# Patient Record
Sex: Male | Born: 1980 | Race: White | Hispanic: No | Marital: Single | Smoking: Current every day smoker
Health system: Southern US, Community
[De-identification: ages and names within clinical notes are randomized; demographics above are authoritative.]

## PROBLEM LIST (undated history)

## (undated) DIAGNOSIS — K760 Fatty (change of) liver, not elsewhere classified: Secondary | ICD-10-CM

## (undated) DIAGNOSIS — F319 Bipolar disorder, unspecified: Secondary | ICD-10-CM

## (undated) DIAGNOSIS — F32A Depression, unspecified: Secondary | ICD-10-CM

## (undated) DIAGNOSIS — M1990 Unspecified osteoarthritis, unspecified site: Secondary | ICD-10-CM

## (undated) DIAGNOSIS — F1120 Opioid dependence, uncomplicated: Secondary | ICD-10-CM

## (undated) DIAGNOSIS — F1110 Opioid abuse, uncomplicated: Secondary | ICD-10-CM

## (undated) DIAGNOSIS — F4310 Post-traumatic stress disorder, unspecified: Secondary | ICD-10-CM

## (undated) DIAGNOSIS — R569 Unspecified convulsions: Secondary | ICD-10-CM

## (undated) DIAGNOSIS — M25569 Pain in unspecified knee: Secondary | ICD-10-CM

## (undated) DIAGNOSIS — F329 Major depressive disorder, single episode, unspecified: Secondary | ICD-10-CM

## (undated) DIAGNOSIS — G40909 Epilepsy, unspecified, not intractable, without status epilepticus: Secondary | ICD-10-CM

## (undated) HISTORY — PX: BOWEL RESECTION: SHX1257

## (undated) HISTORY — DX: Pain in unspecified knee: M25.569

## (undated) HISTORY — DX: Unspecified osteoarthritis, unspecified site: M19.90

## (undated) HISTORY — DX: Bipolar disorder, unspecified: F31.9

## (undated) HISTORY — PX: KNEE SURGERY: SHX244

## (undated) HISTORY — DX: Fatty (change of) liver, not elsewhere classified: K76.0

## (undated) HISTORY — DX: Opioid dependence, uncomplicated: F11.20

## (undated) HISTORY — DX: Post-traumatic stress disorder, unspecified: F43.10

## (undated) HISTORY — PX: COSMETIC SURGERY: SHX468

## (undated) HISTORY — DX: Epilepsy, unspecified, not intractable, without status epilepticus: G40.909

## (undated) HISTORY — DX: Opioid abuse, uncomplicated: F11.10

---

## 1999-01-15 ENCOUNTER — Emergency Department (HOSPITAL_COMMUNITY): Admission: EM | Admit: 1999-01-15 | Discharge: 1999-01-15 | Payer: Self-pay | Admitting: Emergency Medicine

## 1999-01-15 ENCOUNTER — Encounter: Payer: Self-pay | Admitting: Emergency Medicine

## 2001-03-05 ENCOUNTER — Emergency Department (HOSPITAL_COMMUNITY): Admission: EM | Admit: 2001-03-05 | Discharge: 2001-03-05 | Payer: Self-pay | Admitting: Emergency Medicine

## 2001-03-05 ENCOUNTER — Encounter: Payer: Self-pay | Admitting: Emergency Medicine

## 2001-11-12 ENCOUNTER — Encounter: Payer: Self-pay | Admitting: Emergency Medicine

## 2001-11-12 ENCOUNTER — Emergency Department (HOSPITAL_COMMUNITY): Admission: EM | Admit: 2001-11-12 | Discharge: 2001-11-12 | Payer: Self-pay | Admitting: Emergency Medicine

## 2001-11-17 ENCOUNTER — Emergency Department (HOSPITAL_COMMUNITY): Admission: EM | Admit: 2001-11-17 | Discharge: 2001-11-18 | Payer: Self-pay | Admitting: Internal Medicine

## 2001-11-20 ENCOUNTER — Emergency Department (HOSPITAL_COMMUNITY): Admission: EM | Admit: 2001-11-20 | Discharge: 2001-11-21 | Payer: Self-pay

## 2003-12-11 ENCOUNTER — Emergency Department (HOSPITAL_COMMUNITY): Admission: EM | Admit: 2003-12-11 | Discharge: 2003-12-12 | Payer: Self-pay | Admitting: Emergency Medicine

## 2004-08-01 ENCOUNTER — Emergency Department (HOSPITAL_COMMUNITY): Admission: EM | Admit: 2004-08-01 | Discharge: 2004-08-01 | Payer: Self-pay | Admitting: Emergency Medicine

## 2005-02-12 ENCOUNTER — Emergency Department (HOSPITAL_COMMUNITY): Admission: EM | Admit: 2005-02-12 | Discharge: 2005-02-12 | Payer: Self-pay | Admitting: Emergency Medicine

## 2005-02-18 ENCOUNTER — Emergency Department (HOSPITAL_COMMUNITY): Admission: EM | Admit: 2005-02-18 | Discharge: 2005-02-18 | Payer: Self-pay | Admitting: Emergency Medicine

## 2005-02-22 ENCOUNTER — Emergency Department (HOSPITAL_COMMUNITY): Admission: EM | Admit: 2005-02-22 | Discharge: 2005-02-22 | Payer: Self-pay | Admitting: Emergency Medicine

## 2005-02-23 ENCOUNTER — Encounter (HOSPITAL_COMMUNITY): Admission: RE | Admit: 2005-02-23 | Discharge: 2005-05-24 | Payer: Self-pay | Admitting: Orthopaedic Surgery

## 2005-02-26 ENCOUNTER — Emergency Department (HOSPITAL_COMMUNITY): Admission: EM | Admit: 2005-02-26 | Discharge: 2005-02-26 | Payer: Self-pay | Admitting: Emergency Medicine

## 2005-03-12 ENCOUNTER — Emergency Department (HOSPITAL_COMMUNITY): Admission: EM | Admit: 2005-03-12 | Discharge: 2005-03-12 | Payer: Self-pay | Admitting: *Deleted

## 2005-03-21 ENCOUNTER — Ambulatory Visit: Payer: Self-pay | Admitting: Family Medicine

## 2005-03-22 ENCOUNTER — Emergency Department (HOSPITAL_COMMUNITY): Admission: EM | Admit: 2005-03-22 | Discharge: 2005-03-22 | Payer: Self-pay | Admitting: Emergency Medicine

## 2005-04-26 ENCOUNTER — Ambulatory Visit: Payer: Self-pay | Admitting: Family Medicine

## 2005-04-27 ENCOUNTER — Ambulatory Visit: Payer: Self-pay | Admitting: Family Medicine

## 2005-05-04 ENCOUNTER — Ambulatory Visit: Payer: Self-pay | Admitting: Family Medicine

## 2005-05-06 ENCOUNTER — Ambulatory Visit: Payer: Self-pay | Admitting: *Deleted

## 2005-05-06 ENCOUNTER — Ambulatory Visit: Payer: Self-pay | Admitting: Family Medicine

## 2005-05-09 ENCOUNTER — Ambulatory Visit (HOSPITAL_COMMUNITY): Admission: RE | Admit: 2005-05-09 | Discharge: 2005-05-09 | Payer: Self-pay | Admitting: Family Medicine

## 2005-05-23 ENCOUNTER — Ambulatory Visit: Payer: Self-pay | Admitting: Family Medicine

## 2005-07-15 ENCOUNTER — Ambulatory Visit (HOSPITAL_COMMUNITY): Admission: RE | Admit: 2005-07-15 | Discharge: 2005-07-15 | Payer: Self-pay | Admitting: Family Medicine

## 2005-07-15 ENCOUNTER — Ambulatory Visit: Payer: Self-pay | Admitting: Family Medicine

## 2005-08-01 ENCOUNTER — Ambulatory Visit: Payer: Self-pay | Admitting: Family Medicine

## 2005-08-19 ENCOUNTER — Emergency Department (HOSPITAL_COMMUNITY): Admission: EM | Admit: 2005-08-19 | Discharge: 2005-08-19 | Payer: Self-pay | Admitting: Emergency Medicine

## 2007-07-20 ENCOUNTER — Emergency Department (HOSPITAL_COMMUNITY): Admission: EM | Admit: 2007-07-20 | Discharge: 2007-07-20 | Payer: Self-pay | Admitting: Emergency Medicine

## 2007-08-04 ENCOUNTER — Encounter: Payer: Self-pay | Admitting: Emergency Medicine

## 2007-08-05 ENCOUNTER — Inpatient Hospital Stay (HOSPITAL_COMMUNITY): Admission: EM | Admit: 2007-08-05 | Discharge: 2007-08-07 | Payer: Self-pay | Admitting: Orthopedic Surgery

## 2007-09-18 ENCOUNTER — Encounter (HOSPITAL_COMMUNITY): Admission: RE | Admit: 2007-09-18 | Discharge: 2007-10-08 | Payer: Self-pay | Admitting: Orthopedic Surgery

## 2007-11-18 ENCOUNTER — Emergency Department (HOSPITAL_COMMUNITY): Admission: EM | Admit: 2007-11-18 | Discharge: 2007-11-18 | Payer: Self-pay | Admitting: Emergency Medicine

## 2007-11-18 ENCOUNTER — Inpatient Hospital Stay (HOSPITAL_COMMUNITY): Admission: EM | Admit: 2007-11-18 | Discharge: 2007-11-24 | Payer: Self-pay | Admitting: Psychiatry

## 2007-11-21 ENCOUNTER — Ambulatory Visit: Payer: Self-pay | Admitting: Psychiatry

## 2008-05-11 ENCOUNTER — Emergency Department (HOSPITAL_COMMUNITY): Admission: EM | Admit: 2008-05-11 | Discharge: 2008-05-11 | Payer: Self-pay | Admitting: Emergency Medicine

## 2008-07-03 ENCOUNTER — Emergency Department (HOSPITAL_COMMUNITY): Admission: EM | Admit: 2008-07-03 | Discharge: 2008-07-04 | Payer: Self-pay | Admitting: Emergency Medicine

## 2008-10-21 ENCOUNTER — Emergency Department (HOSPITAL_COMMUNITY): Admission: EM | Admit: 2008-10-21 | Discharge: 2008-10-21 | Payer: Self-pay | Admitting: Emergency Medicine

## 2008-11-04 ENCOUNTER — Emergency Department (HOSPITAL_COMMUNITY): Admission: EM | Admit: 2008-11-04 | Discharge: 2008-11-04 | Payer: Self-pay | Admitting: Emergency Medicine

## 2009-01-21 ENCOUNTER — Emergency Department (HOSPITAL_COMMUNITY): Admission: EM | Admit: 2009-01-21 | Discharge: 2009-01-21 | Payer: Self-pay | Admitting: Emergency Medicine

## 2010-01-31 ENCOUNTER — Encounter: Payer: Self-pay | Admitting: Neurology

## 2010-02-11 ENCOUNTER — Other Ambulatory Visit (HOSPITAL_COMMUNITY): Payer: Self-pay | Admitting: Pulmonary Disease

## 2010-02-11 ENCOUNTER — Ambulatory Visit (HOSPITAL_COMMUNITY)
Admission: RE | Admit: 2010-02-11 | Discharge: 2010-02-11 | Disposition: A | Discharge status: Home / Self Care | Payer: Medicaid Other | Source: Ambulatory Visit | Attending: Pulmonary Disease | Admitting: Pulmonary Disease

## 2010-02-11 DIAGNOSIS — M25562 Pain in left knee: Secondary | ICD-10-CM

## 2010-02-11 DIAGNOSIS — IMO0002 Reserved for concepts with insufficient information to code with codable children: Secondary | ICD-10-CM | POA: Insufficient documentation

## 2010-02-11 DIAGNOSIS — M1710 Unilateral primary osteoarthritis, unspecified knee: Secondary | ICD-10-CM | POA: Insufficient documentation

## 2010-02-11 DIAGNOSIS — M25569 Pain in unspecified knee: Secondary | ICD-10-CM | POA: Insufficient documentation

## 2010-03-08 ENCOUNTER — Other Ambulatory Visit (HOSPITAL_COMMUNITY): Payer: Self-pay | Admitting: Pulmonary Disease

## 2010-03-08 DIAGNOSIS — R945 Abnormal results of liver function studies: Secondary | ICD-10-CM

## 2010-03-11 ENCOUNTER — Ambulatory Visit (HOSPITAL_COMMUNITY): Payer: Medicaid Other

## 2010-03-13 ENCOUNTER — Emergency Department (HOSPITAL_COMMUNITY): Payer: Medicaid Other

## 2010-03-13 ENCOUNTER — Emergency Department (HOSPITAL_COMMUNITY)
Admission: EM | Admit: 2010-03-13 | Discharge: 2010-03-13 | Disposition: A | Discharge status: Home / Self Care | Payer: Medicaid Other | Attending: Emergency Medicine | Admitting: Emergency Medicine

## 2010-03-13 DIAGNOSIS — R059 Cough, unspecified: Secondary | ICD-10-CM | POA: Insufficient documentation

## 2010-03-13 DIAGNOSIS — R05 Cough: Secondary | ICD-10-CM | POA: Insufficient documentation

## 2010-03-13 DIAGNOSIS — J3489 Other specified disorders of nose and nasal sinuses: Secondary | ICD-10-CM | POA: Insufficient documentation

## 2010-03-13 DIAGNOSIS — R0609 Other forms of dyspnea: Secondary | ICD-10-CM | POA: Insufficient documentation

## 2010-03-13 DIAGNOSIS — R0989 Other specified symptoms and signs involving the circulatory and respiratory systems: Secondary | ICD-10-CM | POA: Insufficient documentation

## 2010-03-13 DIAGNOSIS — R071 Chest pain on breathing: Secondary | ICD-10-CM | POA: Insufficient documentation

## 2010-03-18 ENCOUNTER — Ambulatory Visit (HOSPITAL_COMMUNITY)
Admission: RE | Admit: 2010-03-18 | Discharge: 2010-03-18 | Disposition: A | Discharge status: Home / Self Care | Payer: Medicaid Other | Source: Ambulatory Visit | Attending: Pulmonary Disease | Admitting: Pulmonary Disease

## 2010-03-18 DIAGNOSIS — R748 Abnormal levels of other serum enzymes: Secondary | ICD-10-CM | POA: Insufficient documentation

## 2010-03-18 DIAGNOSIS — K769 Liver disease, unspecified: Secondary | ICD-10-CM | POA: Insufficient documentation

## 2010-03-18 DIAGNOSIS — R945 Abnormal results of liver function studies: Secondary | ICD-10-CM

## 2010-04-15 LAB — PHENYTOIN LEVEL, TOTAL
Phenytoin Lvl: 14.6 ug/mL (ref 10.0–20.0)
Phenytoin Lvl: <2.5 ug/mL — ABNORMAL LOW (ref 10.0–20.0)
Phenytoin Lvl: <2.5 ug/mL — ABNORMAL LOW (ref 10.0–20.0)

## 2010-04-15 LAB — COMPREHENSIVE METABOLIC PANEL
ALT: 15 U/L (ref 0–53)
AST: 23 U/L (ref 0–37)
Albumin: 3.6 g/dL (ref 3.5–5.2)
Alkaline Phosphatase: 71 U/L (ref 39–117)
BUN: 9 mg/dL (ref 6–23)
CO2: 26 mEq/L (ref 19–32)
Calcium: 9 mg/dL (ref 8.4–10.5)
Chloride: 104 mEq/L (ref 96–112)
Creatinine, Ser: 0.87 mg/dL (ref 0.4–1.5)
GFR calc Af Amer: >60 mL/min (ref >60)
GFR calc non Af Amer: >60 mL/min (ref >60)
Glucose, Bld: 100 mg/dL — ABNORMAL HIGH (ref 70–99)
Potassium: 3.7 mEq/L (ref 3.5–5.1)
Sodium: 135 mEq/L (ref 135–145)
Total Bilirubin: 0.4 mg/dL (ref 0.3–1.2)
Total Protein: 6.3 g/dL (ref 6.0–8.3)

## 2010-04-15 LAB — HEPATIC FUNCTION PANEL
ALT: 21 U/L (ref 0–53)
AST: 26 U/L (ref 0–37)
Albumin: 3.6 g/dL (ref 3.5–5.2)
Alkaline Phosphatase: 61 U/L (ref 39–117)
Bilirubin, Direct: 0.1 mg/dL (ref 0.0–0.3)
Indirect Bilirubin: 0.5 mg/dL (ref 0.3–0.9)
Total Bilirubin: 0.6 mg/dL (ref 0.3–1.2)
Total Protein: 6.5 g/dL (ref 6.0–8.3)

## 2010-04-15 LAB — CK: Total CK: 158 U/L (ref 7–232)

## 2010-04-19 ENCOUNTER — Encounter: Payer: Self-pay | Admitting: Urgent Care

## 2010-04-19 ENCOUNTER — Ambulatory Visit (INDEPENDENT_AMBULATORY_CARE_PROVIDER_SITE_OTHER): Payer: Medicaid Other | Admitting: Urgent Care

## 2010-04-19 VITALS — BP 125/74 | HR 94 | Temp 98.1°F | Ht 65.0 in | Wt 195.0 lb

## 2010-04-19 DIAGNOSIS — K7689 Other specified diseases of liver: Secondary | ICD-10-CM

## 2010-04-19 DIAGNOSIS — K760 Fatty (change of) liver, not elsewhere classified: Secondary | ICD-10-CM

## 2010-04-19 DIAGNOSIS — R748 Abnormal levels of other serum enzymes: Secondary | ICD-10-CM | POA: Insufficient documentation

## 2010-04-19 LAB — DIFFERENTIAL
Basophils Absolute: 0 10*3/uL (ref 0.0–0.1)
Basophils Relative: 1 % (ref 0–1)
Eosinophils Absolute: 0 10*3/uL (ref 0.0–0.7)
Eosinophils Relative: 1 % (ref 0–5)
Lymphocytes Relative: 41 % (ref 12–46)
Lymphs Abs: 2.5 10*3/uL (ref 0.7–4.0)
Monocytes Absolute: 0.6 10*3/uL (ref 0.1–1.0)
Monocytes Relative: 10 % (ref 3–12)
Neutro Abs: 2.9 10*3/uL (ref 1.7–7.7)
Neutrophils Relative %: 48 % (ref 43–77)

## 2010-04-19 LAB — BASIC METABOLIC PANEL
BUN: 12 mg/dL (ref 6–23)
CO2: 27 mEq/L (ref 19–32)
Calcium: 9.1 mg/dL (ref 8.4–10.5)
Chloride: 104 mEq/L (ref 96–112)
Creatinine, Ser: 1.04 mg/dL (ref 0.4–1.5)
GFR calc Af Amer: >60 mL/min (ref >60)
GFR calc non Af Amer: >60 mL/min (ref >60)
Glucose, Bld: 103 mg/dL — ABNORMAL HIGH (ref 70–99)
Potassium: 3.9 mEq/L (ref 3.5–5.1)
Sodium: 140 mEq/L (ref 135–145)

## 2010-04-19 LAB — URINALYSIS, ROUTINE W REFLEX MICROSCOPIC
Bilirubin Urine: NEGATIVE
Glucose, UA: NEGATIVE mg/dL
Hgb urine dipstick: NEGATIVE
Nitrite: NEGATIVE
Protein, ur: NEGATIVE mg/dL
Specific Gravity, Urine: 1.01 (ref 1.005–1.030)
Urobilinogen, UA: 0.2 mg/dL (ref 0.0–1.0)
pH: 7.5 (ref 5.0–8.0)

## 2010-04-19 LAB — RAPID URINE DRUG SCREEN, HOSP PERFORMED
Amphetamines: NOT DETECTED
Barbiturates: NOT DETECTED
Benzodiazepines: POSITIVE — AB
Cocaine: NOT DETECTED
Opiates: POSITIVE — AB
Tetrahydrocannabinol: NOT DETECTED

## 2010-04-19 LAB — HEPATIC FUNCTION PANEL
ALT: 24 U/L (ref 0–53)
AST: 41 U/L — ABNORMAL HIGH (ref 0–37)
Albumin: 3.3 g/dL — ABNORMAL LOW (ref 3.5–5.2)
Alkaline Phosphatase: 66 U/L (ref 39–117)
Bilirubin, Direct: 0.1 mg/dL (ref 0.0–0.3)
Indirect Bilirubin: 0.3 mg/dL (ref 0.3–0.9)
Total Bilirubin: 0.4 mg/dL (ref 0.3–1.2)
Total Protein: 5.8 g/dL — ABNORMAL LOW (ref 6.0–8.3)

## 2010-04-19 LAB — CBC
HCT: 39.9 % (ref 39.0–52.0)
Hemoglobin: 14.1 g/dL (ref 13.0–17.0)
MCHC: 35.3 g/dL (ref 30.0–36.0)
MCV: 93.8 fL (ref 78.0–100.0)
Platelets: 286 10*3/uL (ref 150–400)
RBC: 4.25 MIL/uL (ref 4.22–5.81)
RDW: 14 % (ref 11.5–15.5)
WBC: 6.1 10*3/uL (ref 4.0–10.5)

## 2010-04-19 LAB — LIPASE, BLOOD: Lipase: 16 U/L (ref 11–59)
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
## 2010-04-19 NOTE — Patient Instructions (Signed)
We will call with test results Fatty Liver Hepatosteatosis, Steatohepatitis Fatty liver is the accumulation of fat in liver cells. It is also called hepatosteatosis or steatohepatitis. It is normal for your liver to contain some fat. If fat is more than 5-10% of your liver's weight, you have fatty liver.  There are often no symptoms (problems) for years while damage is still occurring. People often learn about their fatty liver when they have medical tests for other reasons. Fat can damage your liver for years or even decades without causing problems. When it becomes severe, it can cause fatigue, weight loss, weakness, and confusion. This makes you more likely to develop more serious liver problems. The liver is the largest organ in the body. It does a lot of work and often gives no warning signs when it is sick until late in a disease. The liver has many important jobs including:  Breaking down foods.   Storing vitamins, iron, and other minerals.   Making proteins.   Making bile for food digestion.   Breaking down many products including medications, alcohol and some poisons.  CAUSES There are a number of different conditions, medications, and poisons that can cause a fatty liver. Eating too many calories causes fat to build up in the liver. Not processing and breaking fats down normally may also cause this. Certain conditions, such as obesity, diabetes, and high triglycerides also cause this. Most fatty liver patients tend to be middle-aged and over weight.  Some causes of fatty liver are:  Alcohol over consumption.  Malnutrition.   Steroid use.   Valproic acid toxicity.   Obesity.  Cushing's syndrome.   Poisons.   Tetracycline in high dosages.   Pregnancy.  Diabetes.   Hyperlipidemia.   Rapid weight loss.   Some people develop fatty liver even having none of these conditions. SYMPTOMS Fatty liver most often causes no problems. This is called asymptomatic.  It can be  diagnosed with blood tests and also by a liver biopsy.   It is one of the most common causes of minor elevations of liver enzymes on routine blood tests.   Specialized Imaging of the liver using ultrasound, CT (computed tomography) scan, or MRI (magnetic resonance imaging) can suggest a fatty liver but a biopsy is needed to confirm it.   A biopsy involves taking a small sample of liver tissue. This is done by using a needle. It is then looked at under a microscope by a specialist.  TREATMENT  It is important to treat the cause. Simple fatty liver without a medical reason may not need treatment.  Weight loss, fat restriction, and exercise in overweight patients produces inconsistent results but is worth trying.   Fatty liver due to alcohol toxicity may not improve even with stopping drinking.   Good control of diabetes may reduce fatty liver.   Lower your triglycerides through diet, medication or both.   Eat a balanced, healthy diet.   Increase your physical activity.   Get regular checkups from a liver specialist.   There are no medical or surgical treatments for a fatty liver or NASH, but improving your diet and increasing your exercise may help prevent or reverse some of the damage.  PROGNOSIS Fatty liver may cause no damage or it can lead to an inflammation of the liver. This is, called steatohepatitis. When it is linked to alcohol abuse, it is called alcoholic steatohepatitis. It often is not linked to alcohol. It is then called nonalcoholic steatohepatitis, or NASH.  Over time the liver may become scarred and hardened. This condition is called cirrhosis. Cirrhosis is serious and may lead to liver failure or cancer. NASH is one of the leading causes of cirrhosis. About 10-20% of Americans have fatty liver and a smaller 2-5% has NASH. Much of this information is from the  [employer]. Last reviewed by [facility] 02-10-05 Document Released: 02/11/2005 Document Re-Released:  03/25/2008 [facility] Patient Information 2011 [place], [place].

## 2010-04-19 NOTE — Assessment & Plan Note (Signed)
See elevated liver enzymes

## 2010-04-19 NOTE — Assessment & Plan Note (Signed)
30 year old Caucasian male with fatty liver on ultrasound and mildly elevated LFTs that are between 2 and 3 times normal. Alkaline phosphatase is slightly abnormal. He has history of polysubstance abuse. He denies any current alcohol use, although he does have sweet acetone breath today. He is on Dilantin. He does have risk factors for viral hepatitis including tattoos, multiple sexual partners, and history of polysubstance abuse.  He tells me he has had negative viral markers within the past 6 months through Dr. Juanetta Gosling office. I suspect he may be continuing to consume alcohol and in denial leading to elevated liver enzymes. Other differentials include non-alcoholic fatty liver disease, or drug effect. Less likely would be Wilson's disease, iron overload, or autoimmune and given his mild elevations.

## 2010-04-20 ENCOUNTER — Other Ambulatory Visit: Payer: Self-pay | Admitting: Urgent Care

## 2010-04-20 ENCOUNTER — Telehealth: Payer: Self-pay | Admitting: Urgent Care

## 2010-04-20 DIAGNOSIS — R7989 Other specified abnormal findings of blood chemistry: Secondary | ICD-10-CM

## 2010-04-20 LAB — HEPATIC FUNCTION PANEL
ALT: 104 U/L — ABNORMAL HIGH (ref 0–53)
AST: 120 U/L — ABNORMAL HIGH (ref 0–37)
Albumin: 4.3 g/dL (ref 3.5–5.2)
Alkaline Phosphatase: 96 U/L (ref 39–117)
Bilirubin, Direct: 0.1 mg/dL (ref 0.0–0.3)
Indirect Bilirubin: 0.2 mg/dL (ref 0.0–0.9)
Total Bilirubin: 0.3 mg/dL (ref 0.3–1.2)
Total Protein: 7.1 g/dL (ref 6.0–8.3)

## 2010-04-20 LAB — DRUG SCREEN, URINE
Amphetamine Screen, Ur: NEGATIVE
Barbiturate Quant, Ur: NEGATIVE
Benzodiazepines.: POSITIVE — AB
Cocaine Metabolites: NEGATIVE
Creatinine,U: 159.9 mg/dL
Marijuana Metabolite: NEGATIVE
Methadone: NEGATIVE
Opiates: NEGATIVE
Phencyclidine (PCP): NEGATIVE
Propoxyphene: NEGATIVE

## 2010-04-20 LAB — CBC WITH DIFFERENTIAL/PLATELET
Basophils Absolute: 0.1 10*3/uL (ref 0.0–0.1)
Basophils Relative: 2 % — ABNORMAL HIGH (ref 0–1)
Eosinophils Absolute: 0.2 10*3/uL (ref 0.0–0.7)
Eosinophils Relative: 5 % (ref 0–5)
HCT: 40.6 % (ref 39.0–52.0)
Hemoglobin: 13.5 g/dL (ref 13.0–17.0)
Lymphocytes Relative: 38 % (ref 12–46)
Lymphs Abs: 1.9 10*3/uL (ref 0.7–4.0)
MCH: 30.5 pg (ref 26.0–34.0)
MCHC: 33.3 g/dL (ref 30.0–36.0)
MCV: 91.9 fL (ref 78.0–100.0)
Monocytes Absolute: 0.5 10*3/uL (ref 0.1–1.0)
Monocytes Relative: 11 % (ref 3–12)
Neutro Abs: 2.2 10*3/uL (ref 1.7–7.7)
Neutrophils Relative %: 45 % (ref 43–77)
Platelets: 193 10*3/uL (ref 150–400)
RBC: 4.42 MIL/uL (ref 4.22–5.81)
RDW: 14.3 % (ref 11.5–15.5)
WBC: 4.9 10*3/uL (ref 4.0–10.5)

## 2010-04-20 LAB — ETHANOL: Alcohol, Ethyl (B): 161 mg/dL — ABNORMAL HIGH (ref 0–10)

## 2010-04-20 LAB — PHENYTOIN LEVEL, TOTAL: Phenytoin Lvl: 3.4 ug/mL — ABNORMAL LOW (ref 10.0–20.0)

## 2010-04-20 LAB — PROTIME-INR
INR: 0.87 (ref <1.50)
Prothrombin Time: 12 seconds (ref 11.6–15.2)

## 2010-04-20 NOTE — Telephone Encounter (Signed)
I will speak w/ pt & give him test results if he returns call Thanks

## 2010-04-20 NOTE — Telephone Encounter (Signed)
Discussed results w/ pt. He does want to quit drinking etoh. Admits to 7 beers/day here & there Does not want to go to AA Will speak w/ his counselor in [place] Will FU w/ Dr Juanetta Gosling ZO:XWRU cessation **Does not want medical info released to mother--Please place HIPPA flag in chart Needs OV in 1 month w/ me/RMR w/ recheck LFTS prior to OV Thanks

## 2010-04-20 NOTE — Telephone Encounter (Signed)
LMOM for pt to call me back to set up 1 months OV

## 2010-04-20 NOTE — Progress Notes (Signed)
Faxed labs to PCP

## 2010-04-20 NOTE — Telephone Encounter (Signed)
Pt called- said he can be reached at [phone]

## 2010-04-20 NOTE — Telephone Encounter (Signed)
Lab order on file. 

## 2010-04-26 NOTE — Telephone Encounter (Signed)
Letter mailed to patient.

## 2010-04-28 ENCOUNTER — Encounter: Payer: Self-pay | Admitting: Internal Medicine

## 2010-04-28 NOTE — Telephone Encounter (Signed)
Mailed appt card to pt. OV is 06/09/10 @ 330 pm with LSL
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
## 2010-05-28 NOTE — Consult Note (Signed)
NAMEDENNISE, BAMBER              ACCOUNT NO.:  [id number]   MEDICAL RECORD NO.:  [id number]          PATIENT TYPE:  EMS   LOCATION:  ED                            FACILITY:  [facility]   PHYSICIAN:  J. Darreld Mclean, M.D. DATE OF BIRTH:  1980-06-07   DATE OF CONSULTATION:  02/22/2005  DATE OF DISCHARGE:                                   CONSULTATION   REASON FOR CONSULTATION:  This 30 year old male states that approximately 10  days ago he got bit by an insect on his left dorsal wrist.  He was seen in  the ER and given medication.  He was seen about 5 days ago.  He has been on  Keflex.  He has some redness, some streaks and pain.  He has some purulent  material on the wrist.  He can move his fingers.  There is no tendon  synovitis present.  They aspirated some purulent material and sent for  culture and sensitivity.  It is tender.  He is afebrile.  His white count is  11,000 with a slightly increased sedimentation rate.   IMPRESSION:  Infection, left dorsal hand.   RECOMMENDATIONS:  Recommend changing him to vancomycin on IV tonight and  will start him on it x10 days.  I will see him in the office in the morning.  I explained to him if it gets worse, to come back.  Prescription for Tylox  given for pain.           ______________________________  J. Darreld Mclean, M.D.     JWK/MEDQ  D:  02/22/2005  T:  02/22/2005  Job:  [id number]

## 2010-05-28 NOTE — Discharge Summary (Signed)
NAMECHIBUEZE, BEASLEY              ACCOUNT NO.:  [id number]   MEDICAL RECORD NO.:  [id number]          PATIENT TYPE:  INP   LOCATION:  5509                         FACILITY:  [facility]   PHYSICIAN:  Madelynn Done, MD  DATE OF BIRTH:  1980-11-04   DATE OF ADMISSION:  08/05/2007  DATE OF DISCHARGE:  08/07/2007                               DISCHARGE SUMMARY   REASON FOR ADMISSION:  Laceration, right forearm with tendon and artery  involvement.   DISCHARGE MEDICATIONS:  1. Oxycodone 10 mg p.o. q.4-6 hours as needed for pain.  2. Enteric-coated aspirin 325 mg p.o. b.i.d.  3. Colace 100 mg p.o. b.i.d.   PROCEDURE IN DETAIL:  Right forearm wound exploration and nerve tendon  and artery repair on August 05, 2007.   REASON FOR ADMISSION:  Mr. Mckamie is a 30 year old gentleman who  sustained an injury to his right forearm after he falling through a  glass window.  The patient was then transferred to [facility]  and seen and evaluated and taken to the OR on the day of admission.   HOSPITAL COURSE:  The patient tolerated the above procedure.  Throughout  his hospital course, he was afebrile.  Vital signs were stable and  normal.  He is tolerating a regular diet.  He was felt ready for  discharge to home on postoperative day #2.   DISPOSITION:  The patient is to follow up in the office in approximately  10-14 days. He is going to continue the above medications.  He is going  to come back to clinic if he has any wrist pain or problems with the  splint.  Prior to his discharge, pt voiced understanding of the plan.      Madelynn Done, MD  Electronically Signed     FWO/MEDQ  D:  08/09/2007  T:  08/09/2007  Job:  [id number]
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
## 2010-06-09 ENCOUNTER — Ambulatory Visit (INDEPENDENT_AMBULATORY_CARE_PROVIDER_SITE_OTHER): Payer: Medicaid Other | Admitting: Gastroenterology

## 2010-06-09 ENCOUNTER — Encounter: Payer: Self-pay | Admitting: Gastroenterology

## 2010-06-09 VITALS — BP 139/93 | HR 95 | Temp 97.7°F | Ht 65.0 in | Wt 193.8 lb

## 2010-06-09 DIAGNOSIS — F1011 Alcohol abuse, in remission: Secondary | ICD-10-CM | POA: Insufficient documentation

## 2010-06-09 DIAGNOSIS — K760 Fatty (change of) liver, not elsewhere classified: Secondary | ICD-10-CM

## 2010-06-09 DIAGNOSIS — R748 Abnormal levels of other serum enzymes: Secondary | ICD-10-CM

## 2010-06-09 DIAGNOSIS — K7689 Other specified diseases of liver: Secondary | ICD-10-CM

## 2010-06-09 NOTE — Progress Notes (Signed)
Primary Care Physician: Fredirick Maudlin, MD  Primary Gastroenterologist:  Roetta Sessions, MD  Chief Complaint  Patient presents with  . Follow-up    abnormal liver function test    HPI: Dean [name] is a 30 y.o. male here for f/u. History of elevated LFTs. Fatty liver. Etoh abuse. Reported negative HCV in past. Patient had Left knee arthroscopic surgery last week. States he had recurrence of Bell's palsy after surgery. Denies any ongoing alcohol use since his last office visit here. Denies abdominal pain. No heartburn on Nexium. Sleep sitting up due to sleep apnea. States he has not had a sleep study. Takes a stool softener day. Problem is regular. No blood in the stool or melena.   Current Outpatient Prescriptions  Medication Sig Dispense Refill  . buprenorphine-naloxone (SUBOXONE) 2-0.5 MG SUBL Place 8 tablets under the tongue 3 (three) times daily.        . cetirizine (ZYRTEC) 10 MG chewable tablet Chew 10 mg by mouth daily.        . clonazePAM (KLONOPIN) 2 MG tablet Take 2 mg by mouth 2 (two) times daily as needed.        . DULoxetine (CYMBALTA) 60 MG capsule Take 60 mg by mouth daily.        . fluticasone (FLONASE) 50 MCG/ACT nasal spray 2 sprays by Nasal route daily.        Marland Kitchen gabapentin (NEURONTIN) 300 MG capsule Take 600 mg by mouth 3 (three) times daily.        Marland Kitchen ibuprofen (ADVIL,MOTRIN) 800 MG tablet Take 800 mg by mouth every 8 (eight) hours as needed.        . phenytoin (DILANTIN) 300 MG ER capsule Take 300 mg by mouth daily.        . traMADol (ULTRAM) 50 MG tablet Take 50 mg by mouth 2 (two) times daily.          Allergies as of 06/09/2010  . (No Known Allergies)    ROS:  General: Negative for anorexia, weight loss, fever, chills, fatigue, weakness. ENT: Negative for hoarseness, difficulty swallowing , nasal congestion. CV: Negative for chest pain, angina, palpitations, dyspnea on exertion, peripheral edema.  Respiratory: Negative for dyspnea at rest, dyspnea on  exertion, cough, sputum, wheezing.  GI: See history of present illness. GU:  Negative for dysuria, hematuria, urinary incontinence, urinary frequency, nocturnal urination.  Endo: Negative for unusual weight change.    Physical Examination:   BP 139/93  Pulse 95  Temp(Src) 97.7 F (36.5 C) (Temporal)  Ht 5\' 5"  (1.651 m)  Wt 193 lb 12.8 oz (87.907 kg)  BMI 32.25 kg/m2  General: Well-nourished, well-developed in no acute distress.  Eyes: No icterus. Mouth: Oropharyngeal mucosa moist and pink , no lesions erythema or exudate. Lungs: Clear to auscultation bilaterally.  Heart: Regular rate and rhythm, no murmurs rubs or gallops.  Abdomen: Bowel sounds are normal, nontender, nondistended, no hepatosplenomegaly or masses, no abdominal bruits or hernia , no rebound or guarding.   Extremities: No lower extremity edema.  Neuro: Alert and oriented x 4   Skin: Warm and dry, no jaundice.   Psych: Alert and cooperative, normal mood and affect.

## 2010-06-10 ENCOUNTER — Encounter: Payer: Self-pay | Admitting: Gastroenterology

## 2010-06-11 NOTE — Progress Notes (Signed)
Cc to PCP 

## 2010-06-11 NOTE — Assessment & Plan Note (Addendum)
History of abnormal LFTs. Ultrasound shows fatty liver. History of alcohol abuse. He had ethanol level above 140 a day he was seen in our office on 04/19/2010. He denies any ongoing alcohol use however suspect that he been drinking prior to today's visit. He agreed to have blood work done right after the office visit however on his way out of the office he told staff that he would go on Friday. Had a long discussion with him regarding fatty liver. Recommend 1-2# weight loss per week until ideal body weight through exercise & diet. Low fat/cholesterol diet. Gradually increase exercise from 15 min daily up to 1 hr per day 5 days/week. No etoh use.  Will check labs to r/o hemochromatosis as well.

## 2010-06-24 NOTE — Progress Notes (Signed)
FATTY LIVER DISEASE 2o to ETOH/OBESITY. AGREE WITH EVAL FOR HEMOCHROMATOSIS. FERRITIN MAY BE ELEVATED DUE TO IMPAIRED FATTY OXIDATION.

## 2010-06-28 NOTE — Progress Notes (Signed)
Letter mailed to pt.  

## 2010-07-15 ENCOUNTER — Telehealth: Payer: Self-pay | Admitting: Gastroenterology

## 2010-07-15 NOTE — Telephone Encounter (Signed)
Please find out if patient had labs done. If not, please request he does.

## 2010-07-16 NOTE — Telephone Encounter (Signed)
Mailed reminder letter to pt. 

## 2010-08-05 ENCOUNTER — Ambulatory Visit (HOSPITAL_COMMUNITY)
Admission: RE | Admit: 2010-08-05 | Discharge: 2010-08-05 | Disposition: A | Discharge status: Home / Self Care | Payer: Medicare Other | Attending: Psychiatry | Admitting: Psychiatry

## 2010-08-05 ENCOUNTER — Emergency Department (HOSPITAL_COMMUNITY)
Admission: EM | Admit: 2010-08-05 | Discharge: 2010-08-06 | Disposition: A | Discharge status: Other Acute Inpatient Hospital | Payer: Medicare Other | Source: Home / Self Care | Attending: Emergency Medicine | Admitting: Emergency Medicine

## 2010-08-05 DIAGNOSIS — F1010 Alcohol abuse, uncomplicated: Secondary | ICD-10-CM | POA: Insufficient documentation

## 2010-08-05 DIAGNOSIS — Z79899 Other long term (current) drug therapy: Secondary | ICD-10-CM | POA: Insufficient documentation

## 2010-08-05 DIAGNOSIS — F329 Major depressive disorder, single episode, unspecified: Secondary | ICD-10-CM | POA: Insufficient documentation

## 2010-08-05 DIAGNOSIS — F3289 Other specified depressive episodes: Secondary | ICD-10-CM | POA: Insufficient documentation

## 2010-08-05 LAB — COMPREHENSIVE METABOLIC PANEL
ALT: 122 U/L — ABNORMAL HIGH (ref 0–53)
AST: 148 U/L — ABNORMAL HIGH (ref 0–37)
Albumin: 4.2 g/dL (ref 3.5–5.2)
Alkaline Phosphatase: 96 U/L (ref 39–117)
BUN: 6 mg/dL (ref 6–23)
CO2: 26 mEq/L (ref 19–32)
Calcium: 9.9 mg/dL (ref 8.4–10.5)
Chloride: 95 mEq/L — ABNORMAL LOW (ref 96–112)
Creatinine, Ser: 0.7 mg/dL (ref 0.50–1.35)
GFR calc Af Amer: >60 mL/min (ref >60)
GFR calc non Af Amer: >60 mL/min (ref >60)
Glucose, Bld: 114 mg/dL — ABNORMAL HIGH (ref 70–99)
Potassium: 3.7 mEq/L (ref 3.5–5.1)
Sodium: 133 mEq/L — ABNORMAL LOW (ref 135–145)
Total Bilirubin: 0.3 mg/dL (ref 0.3–1.2)
Total Protein: 8.5 g/dL — ABNORMAL HIGH (ref 6.0–8.3)

## 2010-08-05 LAB — DIFFERENTIAL
Basophils Absolute: 0.1 10*3/uL (ref 0.0–0.1)
Basophils Relative: 2 % — ABNORMAL HIGH (ref 0–1)
Eosinophils Absolute: 0.1 10*3/uL (ref 0.0–0.7)
Eosinophils Relative: 2 % (ref 0–5)
Lymphocytes Relative: 29 % (ref 12–46)
Lymphs Abs: 1.8 10*3/uL (ref 0.7–4.0)
Monocytes Absolute: 0.9 10*3/uL (ref 0.1–1.0)
Monocytes Relative: 15 % — ABNORMAL HIGH (ref 3–12)
Neutro Abs: 3.2 10*3/uL (ref 1.7–7.7)
Neutrophils Relative %: 52 % (ref 43–77)

## 2010-08-05 LAB — LITHIUM LEVEL: Lithium Lvl: 0.26 mEq/L — ABNORMAL LOW (ref 0.80–1.40)

## 2010-08-05 LAB — ETHANOL: Alcohol, Ethyl (B): 52 mg/dL — ABNORMAL HIGH (ref 0–11)

## 2010-08-05 LAB — CBC
HCT: 42.6 % (ref 39.0–52.0)
Hemoglobin: 15.1 g/dL (ref 13.0–17.0)
MCH: 31.5 pg (ref 26.0–34.0)
MCHC: 35.4 g/dL (ref 30.0–36.0)
MCV: 88.9 fL (ref 78.0–100.0)
Platelets: 363 10*3/uL (ref 150–400)
RBC: 4.79 MIL/uL (ref 4.22–5.81)
RDW: 12.8 % (ref 11.5–15.5)
WBC: 6.1 10*3/uL (ref 4.0–10.5)

## 2010-08-05 LAB — RAPID URINE DRUG SCREEN, HOSP PERFORMED
Amphetamines: NOT DETECTED
Barbiturates: NOT DETECTED
Benzodiazepines: POSITIVE — AB
Cocaine: NOT DETECTED
Opiates: NOT DETECTED
Tetrahydrocannabinol: NOT DETECTED

## 2010-08-06 ENCOUNTER — Inpatient Hospital Stay (HOSPITAL_COMMUNITY)
Admission: RE | Admit: 2010-08-06 | Discharge: 2010-08-09 | DRG: 897 | Disposition: A | Discharge status: Home / Self Care | Payer: Medicare Other | Source: Ambulatory Visit | Attending: Psychiatry | Admitting: Psychiatry

## 2010-08-06 DIAGNOSIS — F1020 Alcohol dependence, uncomplicated: Secondary | ICD-10-CM

## 2010-08-06 DIAGNOSIS — G40909 Epilepsy, unspecified, not intractable, without status epilepticus: Secondary | ICD-10-CM

## 2010-08-06 DIAGNOSIS — F1910 Other psychoactive substance abuse, uncomplicated: Secondary | ICD-10-CM

## 2010-08-06 DIAGNOSIS — R748 Abnormal levels of other serum enzymes: Secondary | ICD-10-CM

## 2010-08-06 DIAGNOSIS — F4310 Post-traumatic stress disorder, unspecified: Secondary | ICD-10-CM

## 2010-08-06 DIAGNOSIS — F1120 Opioid dependence, uncomplicated: Secondary | ICD-10-CM

## 2010-08-06 DIAGNOSIS — Z560 Unemployment, unspecified: Secondary | ICD-10-CM

## 2010-08-06 LAB — PHENYTOIN LEVEL, TOTAL: Phenytoin Lvl: 4.5 ug/mL — ABNORMAL LOW (ref 10.0–20.0)

## 2010-08-06 LAB — HEPATIC FUNCTION PANEL
ALT: 101 U/L — ABNORMAL HIGH (ref 0–53)
AST: 89 U/L — ABNORMAL HIGH (ref 0–37)
Albumin: 4.1 g/dL (ref 3.5–5.2)
Alkaline Phosphatase: 98 U/L (ref 39–117)
Bilirubin, Direct: 0.1 mg/dL (ref 0.0–0.3)
Indirect Bilirubin: 0.2 mg/dL — ABNORMAL LOW (ref 0.3–0.9)
Total Bilirubin: 0.3 mg/dL (ref 0.3–1.2)
Total Protein: 7.8 g/dL (ref 6.0–8.3)

## 2010-08-09 LAB — HEPATIC FUNCTION PANEL
ALT: 63 U/L — ABNORMAL HIGH (ref 0–53)
AST: 42 U/L — ABNORMAL HIGH (ref 0–37)
Albumin: 3.9 g/dL (ref 3.5–5.2)
Alkaline Phosphatase: 91 U/L (ref 39–117)
Bilirubin, Direct: 0.1 mg/dL (ref 0.0–0.3)
Indirect Bilirubin: 0.1 mg/dL — ABNORMAL LOW (ref 0.3–0.9)
Total Bilirubin: 0.2 mg/dL — ABNORMAL LOW (ref 0.3–1.2)
Total Protein: 7.4 g/dL (ref 6.0–8.3)

## 2010-08-13 NOTE — Discharge Summary (Signed)
  NAMESMILEY, Dean [name]              ACCOUNT NO.:  [id number]  MEDICAL RECORD NO.:  [id number]  LOCATION:  7829                          FACILITY:  [facility]  PHYSICIAN:  Orson Aloe, MD       DATE OF BIRTH:  October 25, 1980  DATE OF ADMISSION:  08/06/2010 DATE OF DISCHARGE:  08/09/2010                              DISCHARGE SUMMARY   REASON FOR ADMISSION:  The patient presented to the emergency department and wanted to get help with his alcohol use.  He reported he had been drinking very heavily with a history of DTs and seizures drinking anywhere from a pint to a fifth a day.  Last use was 1 day prior to admission.  He denied any suicidal thoughts.  He is currently receiving Suboxone for a history of opiate addiction.  PERTINENT LABORATORY DATA:  His SGOT was elevated at 148.  His alcohol level was 52.  His lithium level was 0.26.  CPK was within normal limits.  Dilantin level was 4.5 on admission.  On discharge, his SGOT was down to 42, still above range but much improved.  FINAL DIAGNOSES:  Axis I: 1. Alcohol dependence. 2. Polysubstance abuse. 3. Posttraumatic stress disorder. Axis II:  Deferred. Axis III:  Seizure disorder on Dilantin. Axis IV:  Moderate, housing. Axis V:  GAF 45.  SIGNIFICANT FINDINGS:  The patient had elevated liver enzymes.  He had been adjusted in the past on lithium b.i.d., gabapentin 300 mg t.i.d., Dilantin 300 mg nightly, Flonase nasal spray as needed and Suboxone 8/2 mg 3 times a day.  PROCEDURES PERFORMED/TREATMENT RENDERED:  The patient was admitted to the dual-diagnosis substance abuse mental illness unit and placed on a Librium detox protocol which was quite effective for him.  He was adjusted to Suboxone 16 mg dose as research does not seem to indicate much benefit above 16 mg.  He seemed to adjust pretty well to that dose in the inpatient setting.  His liver enzymes improved over the hospital stay.  He benefited from the milieu and the group as  well as individual therapies.  He seemed to be motivated to improve his life upon discharge.  CONDITION ON DISCHARGE:  He described his mood as a 4 on a scale of 1 the least to 10 the most depressed, and his anxiety improved but was still pretty elevated at 5-7 with 1 the least and 10 the most as well. He denied any suicidal or homicidal ideation, any hallucinations, illusions, or delusions.  He was alert and had good eye contact, had good goal-directed thoughts, had conversational speech with natural volume, tone and rate.  He had recent and remote memory that was intact.  DISCHARGE INSTRUCTIONS:  Instructions at discharge included to return to physical activity, continue his Dilantin 300 mg nightly, lithium 300 mg b.i.d.  He was discharged on the lithium, Dilantin and Suboxone.  He was not given a prescription for Suboxone.          ______________________________ Orson Aloe, MD     EW/MEDQ  D:  08/10/2010  T:  08/10/2010  Job:  [id number]  Electronically Signed by Orson Aloe  on 08/13/2010 10:38:39 AM

## 2010-08-13 NOTE — Assessment & Plan Note (Signed)
  [name], [name] NO.:  [id number]  MEDICAL RECORD NO.:  [id number]  LOCATION:  1610                          [facility]ACILITY:  [facility]  PHYSICIAN:  Orson Aloe, MD       DATE OF BIRTH:  Mar 16, 1980  DATE OF ADMISSION:  08/06/2010 DATE OF DISCHARGE:                      PSYCHIATRIC ADMISSION ASSESSMENT   IDENTIFYING DATA:  This is a 30 year old male voluntarily admitted on August 06, 2010.  HISTORY OF PRESENT ILLNESS:  The patient presented to the emergency department and wanted to get help with alcohol use.  He reports he has been drinking very heavily with a history of DTs and seizures, drinking anywhere from a pint to a fifth.  Last use was day prior to admission. He was denying any suicidal thoughts.  He is currently receiving Suboxone for a history of opiate addiction.  PAST PSYCHIATRIC HISTORY:  First admission to be the [facility].  Goes to [facility] for Suboxone treatment.  SOCIAL HISTORY:  The patient is single.  He resides in [place].  He is unemployed.  Reports no legal troubles.  FAMILY HISTORY:  None.  ALCOHOL AND DRUG HISTORY:  As above.  PRIMARY CARE PROVIDER:  None.  MEDICAL PROBLEMS:  A history of seizures related to alcohol currently on Dilantin.  MEDICATIONS: 1. Suboxone 8 mg 1 t.i.d. 2. Dilantin 300 mg nightly. 3. Lithium 300 mg b.i.d. 4. Gabapentin 300 mg t.i.d. 5. Flonase nasal spray as needed.  PHYSICAL EXAMINATION:  The patient was assessed in the emergency department.  He is a normally-developed male in no acute distress.  He is reporting having some withdrawal symptoms.  LABORATORY DATA:  SGOT elevated at 148.  Alcohol level of 52.  Lithium level 0.26.  CBC is within normal limits.  Dilantin level of 4.5.  MENTAL STATUS EXAM:  The patient was resting in bed.  He was sleepy.  He was able to provide information, asking for his Suboxone which he states he gets from [facility].  He denies  any suicidal or homicidal thoughts.  Cognitive functioning intact.  His memory appears intact. Judgment and insight are fair.  DIAGNOSES:  Axis I: 1. Opiate dependence. 2. Alcohol abuse.  Rule out dependence. Axis II:  Deferred. Axis III:  History of seizures. Axis IV:  Other psychosocial problems related to chronic substance use. Axis V:  Current GAF is 35-40.  PLAN:  Our plan is to place the patient on the Librium protocol.  We omitted his Klonopin.  The patient was aware that he would not be receiving this.  We will continue with his Dilantin at 300 mg nightly. We will contact [facility] to clarify his Suboxone dosing and assess his motivation for rehab.  The patient was encouraged to participate in groups.  His tentative length of stay at this time is 3-5 days.      Landry Corporal, N.P.   ______________________________ Orson Aloe, MD    JO/MEDQ  D:  08/06/2010  T:  08/06/2010  Job:  [id number]  Electronically Signed by Limmie PatriciaP. on 08/09/2010 11:29:28 AM Electronically Signed by Orson Aloe  on 08/13/2010 10:38:34 AM

## 2010-09-26 ENCOUNTER — Encounter (HOSPITAL_COMMUNITY): Payer: Self-pay | Admitting: *Deleted

## 2010-09-26 ENCOUNTER — Emergency Department (HOSPITAL_COMMUNITY)
Admission: EM | Admit: 2010-09-26 | Discharge: 2010-09-26 | Disposition: A | Discharge status: Home / Self Care | Payer: Medicare Other | Attending: Emergency Medicine | Admitting: Emergency Medicine

## 2010-09-26 DIAGNOSIS — F1010 Alcohol abuse, uncomplicated: Secondary | ICD-10-CM | POA: Insufficient documentation

## 2010-09-26 DIAGNOSIS — F10929 Alcohol use, unspecified with intoxication, unspecified: Secondary | ICD-10-CM

## 2010-09-26 LAB — CBC
HCT: 41 % (ref 39.0–52.0)
Hemoglobin: 13.5 g/dL (ref 13.0–17.0)
MCH: 30.8 pg (ref 26.0–34.0)
MCHC: 32.9 g/dL (ref 30.0–36.0)
MCV: 93.6 fL (ref 78.0–100.0)
Platelets: 134 10*3/uL — ABNORMAL LOW (ref 150–400)
RBC: 4.38 MIL/uL (ref 4.22–5.81)
RDW: 13.7 % (ref 11.5–15.5)
WBC: 7.1 10*3/uL (ref 4.0–10.5)

## 2010-09-26 LAB — URINALYSIS, ROUTINE W REFLEX MICROSCOPIC
Bilirubin Urine: NEGATIVE
Glucose, UA: NEGATIVE mg/dL
Ketones, ur: NEGATIVE mg/dL
Leukocytes, UA: NEGATIVE
Nitrite: NEGATIVE
Protein, ur: NEGATIVE mg/dL
Specific Gravity, Urine: <1.005 — ABNORMAL LOW (ref 1.005–1.030)
Urobilinogen, UA: 0.2 mg/dL (ref 0.0–1.0)
pH: 6 (ref 5.0–8.0)

## 2010-09-26 LAB — DIFFERENTIAL
Basophils Absolute: 0.1 10*3/uL (ref 0.0–0.1)
Basophils Relative: 1 % (ref 0–1)
Eosinophils Absolute: 0 10*3/uL (ref 0.0–0.7)
Eosinophils Relative: 1 % (ref 0–5)
Lymphocytes Relative: 23 % (ref 12–46)
Lymphs Abs: 1.6 10*3/uL (ref 0.7–4.0)
Monocytes Absolute: 0.4 10*3/uL (ref 0.1–1.0)
Monocytes Relative: 5 % (ref 3–12)
Neutro Abs: 5 10*3/uL (ref 1.7–7.7)
Neutrophils Relative %: 71 % (ref 43–77)

## 2010-09-26 LAB — BASIC METABOLIC PANEL
BUN: 9 mg/dL (ref 6–23)
CO2: 24 mEq/L (ref 19–32)
Calcium: 8.4 mg/dL (ref 8.4–10.5)
Chloride: 97 mEq/L (ref 96–112)
Creatinine, Ser: 0.53 mg/dL (ref 0.50–1.35)
GFR calc Af Amer: >60 mL/min (ref >60)
GFR calc non Af Amer: >60 mL/min (ref >60)
Glucose, Bld: 84 mg/dL (ref 70–99)
Potassium: 3.6 mEq/L (ref 3.5–5.1)
Sodium: 136 mEq/L (ref 135–145)

## 2010-09-26 LAB — URINE MICROSCOPIC-ADD ON

## 2010-09-26 LAB — ETHANOL
Alcohol, Ethyl (B): 411 mg/dL (ref 0–11)
Alcohol, Ethyl (B): 251 mg/dL — ABNORMAL HIGH (ref 0–11)

## 2010-09-26 LAB — RAPID URINE DRUG SCREEN, HOSP PERFORMED
Amphetamines: NOT DETECTED
Barbiturates: NOT DETECTED
Benzodiazepines: NOT DETECTED
Cocaine: NOT DETECTED
Opiates: NOT DETECTED
Tetrahydrocannabinol: NOT DETECTED

## 2010-09-26 MED ORDER — GABAPENTIN 300 MG PO CAPS
600.0000 mg | ORAL_CAPSULE | Freq: Once | ORAL | Status: DC
  Filled 2010-09-26: qty 2

## 2010-09-26 MED ORDER — CLONAZEPAM 0.5 MG PO TABS
2.0000 mg | ORAL_TABLET | Freq: Two times a day (BID) | ORAL | Status: DC
  Administered 2010-09-26: 2 mg via ORAL

## 2010-09-26 MED ORDER — BUPRENORPHINE HCL 2 MG SL SUBL: 4.0000 mg | SUBLINGUAL_TABLET | Freq: Every day | SUBLINGUAL | Status: DC

## 2010-09-26 MED ORDER — GABAPENTIN 300 MG PO CAPS
600.0000 mg | ORAL_CAPSULE | Freq: Three times a day (TID) | ORAL | Status: DC
  Administered 2010-09-26: 600 mg via ORAL
  Filled 2010-09-26: qty 2

## 2010-09-26 MED ORDER — CLONAZEPAM 0.5 MG PO TABS: 2.0000 mg | ORAL_TABLET | Freq: Once | ORAL | Status: DC

## 2010-09-26 MED ORDER — CLONAZEPAM 0.5 MG PO TABS: 2.0000 mg | ORAL_TABLET | Freq: Two times a day (BID) | ORAL | Status: DC

## 2010-09-26 MED ORDER — BUPRENORPHINE HCL 2 MG SL SUBL: 4.0000 mg | SUBLINGUAL_TABLET | Freq: Once | SUBLINGUAL | Status: DC

## 2010-09-26 MED ORDER — ACETAMINOPHEN 325 MG PO TABS: 650.0000 mg | ORAL_TABLET | ORAL | Status: DC | PRN

## 2010-09-26 MED ORDER — CLONAZEPAM 0.5 MG PO TABS
4.0000 mg | ORAL_TABLET | Freq: Two times a day (BID) | ORAL | Status: DC
  Filled 2010-09-26: qty 8

## 2010-09-26 MED ORDER — ZOLPIDEM TARTRATE 5 MG PO TABS: 5.0000 mg | ORAL_TABLET | Freq: Every evening | ORAL | Status: DC | PRN

## 2010-09-26 MED ORDER — PHENYTOIN SODIUM EXTENDED 100 MG PO CAPS
300.0000 mg | ORAL_CAPSULE | Freq: Every day | ORAL | Status: DC
  Administered 2010-09-26: 300 mg via ORAL
  Filled 2010-09-26: qty 3

## 2010-09-26 MED ORDER — BUPRENORPHINE HCL 2 MG SL SUBL: 4.0000 mg | SUBLINGUAL_TABLET | Freq: Three times a day (TID) | SUBLINGUAL | Status: DC

## 2010-09-26 MED ORDER — ONDANSETRON HCL 4 MG PO TABS: 4.0000 mg | ORAL_TABLET | Freq: Three times a day (TID) | ORAL | Status: DC | PRN

## 2010-09-26 NOTE — ED Provider Notes (Signed)
The patient is calm, cooperative, and pleasant in the emergency department. He denies any threats to harm himself or others. He does not appear to be in withdrawal at this time. He does not want detox services. He was evaluated by the [facility] team member. His involuntary commitment paperwork was rescinded. The patient now has a ride home. He will not be staying with his family where he has been having difficulty interacting with them. He now appears stable for outpatient followup. He recently completed detox.  Hurman Horn, MD 09/26/10 1146

## 2010-09-26 NOTE — ED Notes (Signed)
Pt called me in room requesting his mourning medi; Dr. Colon Branch notified and order for pt to have his meds this am given
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
## 2010-09-26 NOTE — ED Notes (Signed)
[employer] signed observation release form.  nad noted.

## 2010-09-26 NOTE — ED Notes (Signed)
CRITICAL VALUE ALERT  Critical value received: ETOH 411  Date of notification: 09/25/2010  Time of notification: 01:50  Critical value read back: yes  Nurse who received alert: Neldon Mc RN  MD notified (1st page):  01:50  Time of first page: 01:50  MD notified (2nd page):  Time of second page:  Responding MD: 01:50  Time MD responded: 01:50

## 2010-09-26 NOTE — ED Notes (Signed)
Pt given frozen dinner; [employer] at bedside
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
## 2010-09-26 NOTE — ED Notes (Signed)
Dean [name] with [facility] in to evaluate pt.  nad noted at this time.
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
## 2010-09-26 NOTE — ED Notes (Signed)
Pt denies any SI/HI

## 2010-10-08 LAB — RAPID URINE DRUG SCREEN, HOSP PERFORMED
Amphetamines: NOT DETECTED
Barbiturates: NOT DETECTED
Benzodiazepines: POSITIVE — AB
Cocaine: NOT DETECTED
Opiates: POSITIVE — AB
Tetrahydrocannabinol: POSITIVE — AB

## 2010-10-08 LAB — URINE MICROSCOPIC-ADD ON

## 2010-10-08 LAB — DIFFERENTIAL
Basophils Absolute: 0
Basophils Relative: 0
Eosinophils Absolute: 0.1
Eosinophils Relative: 1
Lymphocytes Relative: 12
Lymphs Abs: 1
Monocytes Absolute: 0.4
Monocytes Relative: 5
Neutro Abs: 7.2
Neutrophils Relative %: 82 — ABNORMAL HIGH

## 2010-10-08 LAB — BASIC METABOLIC PANEL
BUN: 8
CO2: 22
Calcium: 8.8
Chloride: 110
Creatinine, Ser: 1.02
GFR calc Af Amer: >60
GFR calc non Af Amer: >60
Glucose, Bld: 121 — ABNORMAL HIGH
Potassium: 4.3
Sodium: 142

## 2010-10-08 LAB — CBC
HCT: 39.7
Hemoglobin: 13.6
MCHC: 34.4
MCV: 92.8
Platelets: 214
RBC: 4.27
RDW: 13.1
WBC: 8.8

## 2010-10-08 LAB — URINALYSIS, ROUTINE W REFLEX MICROSCOPIC
Bilirubin Urine: NEGATIVE
Glucose, UA: NEGATIVE
Ketones, ur: NEGATIVE
Leukocytes, UA: NEGATIVE
Nitrite: NEGATIVE
Protein, ur: 100 — AB
Specific Gravity, Urine: 1.02
Urobilinogen, UA: 0.2
pH: 7

## 2010-10-08 LAB — ETHANOL: Alcohol, Ethyl (B): 176 — ABNORMAL HIGH

## 2010-10-12 LAB — HEPATIC FUNCTION PANEL
ALT: 55 — ABNORMAL HIGH
AST: 67 — ABNORMAL HIGH
Albumin: 3.5
Alkaline Phosphatase: 83
Bilirubin, Direct: 0.1
Indirect Bilirubin: 0.3
Total Bilirubin: 0.4
Total Protein: 6.3

## 2010-10-13 LAB — DIFFERENTIAL
Basophils Absolute: 0
Basophils Relative: 0
Eosinophils Absolute: 0
Eosinophils Relative: 0
Lymphocytes Relative: 22
Lymphs Abs: 2.5
Monocytes Absolute: 0.8
Monocytes Relative: 7
Neutro Abs: 8.2 — ABNORMAL HIGH
Neutrophils Relative %: 71

## 2010-10-13 LAB — CBC
HCT: 44.7
Hemoglobin: 15
MCHC: 33.6
MCV: 90.3
Platelets: 392
RBC: 4.95
RDW: 13.3
WBC: 11.6 — ABNORMAL HIGH

## 2010-10-13 LAB — BASIC METABOLIC PANEL
BUN: 14
CO2: 27
Calcium: 9
Chloride: 100
Creatinine, Ser: 0.92
GFR calc Af Amer: >60
GFR calc non Af Amer: >60
Glucose, Bld: 160 — ABNORMAL HIGH
Potassium: 3.8
Sodium: 135

## 2010-10-13 LAB — RAPID URINE DRUG SCREEN, HOSP PERFORMED
Amphetamines: NOT DETECTED
Barbiturates: NOT DETECTED
Benzodiazepines: NOT DETECTED
Cocaine: NOT DETECTED
Opiates: NOT DETECTED
Tetrahydrocannabinol: NOT DETECTED

## 2010-10-13 LAB — ETHANOL: Alcohol, Ethyl (B): <5

## 2010-10-13 LAB — PHENYTOIN LEVEL, TOTAL: Phenytoin Lvl: 13.8

## 2010-10-13 LAB — TRICYCLICS SCREEN, URINE: TCA Scrn: NOT DETECTED

## 2010-10-22 ENCOUNTER — Encounter (HOSPITAL_COMMUNITY): Payer: Self-pay | Admitting: *Deleted

## 2010-10-22 ENCOUNTER — Emergency Department (HOSPITAL_COMMUNITY)
Admission: EM | Admit: 2010-10-22 | Discharge: 2010-10-22 | Disposition: A | Discharge status: Home / Self Care | Payer: Medicare Other | Attending: Emergency Medicine | Admitting: Emergency Medicine

## 2010-10-22 DIAGNOSIS — M129 Arthropathy, unspecified: Secondary | ICD-10-CM | POA: Insufficient documentation

## 2010-10-22 DIAGNOSIS — F17200 Nicotine dependence, unspecified, uncomplicated: Secondary | ICD-10-CM | POA: Insufficient documentation

## 2010-10-22 DIAGNOSIS — F319 Bipolar disorder, unspecified: Secondary | ICD-10-CM | POA: Insufficient documentation

## 2010-10-22 DIAGNOSIS — G40909 Epilepsy, unspecified, not intractable, without status epilepticus: Secondary | ICD-10-CM | POA: Insufficient documentation

## 2010-10-22 DIAGNOSIS — R7402 Elevation of levels of lactic acid dehydrogenase (LDH): Secondary | ICD-10-CM | POA: Insufficient documentation

## 2010-10-22 DIAGNOSIS — R7401 Elevation of levels of liver transaminase levels: Secondary | ICD-10-CM | POA: Insufficient documentation

## 2010-10-22 DIAGNOSIS — R748 Abnormal levels of other serum enzymes: Secondary | ICD-10-CM

## 2010-10-22 DIAGNOSIS — F4310 Post-traumatic stress disorder, unspecified: Secondary | ICD-10-CM | POA: Insufficient documentation

## 2010-10-22 DIAGNOSIS — F1020 Alcohol dependence, uncomplicated: Secondary | ICD-10-CM

## 2010-10-22 DIAGNOSIS — F1010 Alcohol abuse, uncomplicated: Secondary | ICD-10-CM

## 2010-10-22 LAB — COMPREHENSIVE METABOLIC PANEL
ALT: 160 U/L — ABNORMAL HIGH (ref 0–53)
AST: 273 U/L — ABNORMAL HIGH (ref 0–37)
Albumin: 4.5 g/dL (ref 3.5–5.2)
Alkaline Phosphatase: 101 U/L (ref 39–117)
BUN: 6 mg/dL (ref 6–23)
CO2: 26 mEq/L (ref 19–32)
Calcium: 8.8 mg/dL (ref 8.4–10.5)
Chloride: 100 mEq/L (ref 96–112)
Creatinine, Ser: 0.56 mg/dL (ref 0.50–1.35)
GFR calc Af Amer: >90 mL/min (ref >90)
GFR calc non Af Amer: >90 mL/min (ref >90)
Glucose, Bld: 83 mg/dL (ref 70–99)
Potassium: 3.9 mEq/L (ref 3.5–5.1)
Sodium: 144 mEq/L (ref 135–145)
Total Bilirubin: 0.7 mg/dL (ref 0.3–1.2)
Total Protein: 8.1 g/dL (ref 6.0–8.3)

## 2010-10-22 LAB — CBC
HCT: 43.6 % (ref 39.0–52.0)
Hemoglobin: 14.3 g/dL (ref 13.0–17.0)
MCH: 30.7 pg (ref 26.0–34.0)
MCHC: 32.8 g/dL (ref 30.0–36.0)
MCV: 93.6 fL (ref 78.0–100.0)
Platelets: 132 10*3/uL — ABNORMAL LOW (ref 150–400)
RBC: 4.66 MIL/uL (ref 4.22–5.81)
RDW: 14.2 % (ref 11.5–15.5)
WBC: 4.4 10*3/uL (ref 4.0–10.5)

## 2010-10-22 LAB — RAPID URINE DRUG SCREEN, HOSP PERFORMED
Amphetamines: NOT DETECTED
Barbiturates: NOT DETECTED
Benzodiazepines: POSITIVE — AB
Cocaine: NOT DETECTED
Opiates: NOT DETECTED
Tetrahydrocannabinol: POSITIVE — AB

## 2010-10-22 LAB — ETHANOL: Alcohol, Ethyl (B): 444 mg/dL (ref 0–11)

## 2010-10-22 MED ORDER — NICOTINE 21 MG/24HR TD PT24
21.0000 mg | MEDICATED_PATCH | Freq: Once | TRANSDERMAL | Status: DC
  Administered 2010-10-22: 21 mg via TRANSDERMAL
  Filled 2010-10-22: qty 1

## 2010-10-22 MED ORDER — LORAZEPAM 1 MG PO TABS
1.0000 mg | ORAL_TABLET | Freq: Once | ORAL | Status: AC
  Administered 2010-10-22: 1 mg via ORAL
  Filled 2010-10-22: qty 1

## 2010-10-22 NOTE — ED Notes (Signed)
Pt presents to er for detox from drinking for 6 and half months, states that he was drinking at least a pint to half gallon, pt states that he started drinking due to ptsd from being in prison for a total of 6 years. Does admit to drug use at times. Pt states that he has had problems in the past with heroin addiction and was kicked out of the tx program that he was in due to the etoh abuse. Pt denies any SI/HI, cooperative but can be loud, mother at bedside, supportive of pt and treatment.

## 2010-10-22 NOTE — ED Notes (Signed)
Pt states he has been drinking 31 days. Pt came from [facility] by EMS for detox. Last drank 1/2 pint of vodka this morning. Pt is cooperative at this time.

## 2010-10-22 NOTE — ED Notes (Signed)
Pt given meal tray, will arouse,
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
## 2010-10-22 NOTE — ED Notes (Signed)
Pt has been wanded by security. Pt remains pleasant and cooperative, although loud at times.

## 2010-10-22 NOTE — ED Notes (Signed)
Pt upset about wanting to go outside and smoke, no smoking policy explained to pt, pt states that he needs to smoke due to anxiety issues. Dr. Fonnie Jarvis at bedside also explained no smoking policy to pt, additional orders given
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
## 2010-10-22 NOTE — ED Notes (Signed)
Dean [name] (ACT) here to evaluate pt  

## 2010-10-22 NOTE — ED Notes (Signed)
Pt sleeping, will arouse

## 2010-10-22 NOTE — ED Notes (Signed)
Pt refusing to change into gown or paper scrubs, blanket offered for warmth, pt still refusing, mother states that she is afraid to take pt somewhere herself to due increase agitation that he has with her.

## 2010-10-24 ENCOUNTER — Inpatient Hospital Stay (HOSPITAL_COMMUNITY)
Admission: EM | Admit: 2010-10-24 | Discharge: 2010-11-05 | DRG: 897 | Disposition: A | Discharge status: Home / Self Care | Payer: Medicare Other | Attending: Pulmonary Disease | Admitting: Pulmonary Disease

## 2010-10-24 ENCOUNTER — Encounter (HOSPITAL_COMMUNITY): Payer: Self-pay | Admitting: *Deleted

## 2010-10-24 DIAGNOSIS — F10231 Alcohol dependence with withdrawal delirium: Principal | ICD-10-CM | POA: Diagnosis present

## 2010-10-24 DIAGNOSIS — F1910 Other psychoactive substance abuse, uncomplicated: Secondary | ICD-10-CM | POA: Diagnosis present

## 2010-10-24 DIAGNOSIS — I10 Essential (primary) hypertension: Secondary | ICD-10-CM | POA: Diagnosis present

## 2010-10-24 DIAGNOSIS — F4310 Post-traumatic stress disorder, unspecified: Secondary | ICD-10-CM | POA: Diagnosis present

## 2010-10-24 DIAGNOSIS — F10239 Alcohol dependence with withdrawal, unspecified: Secondary | ICD-10-CM

## 2010-10-24 DIAGNOSIS — F1020 Alcohol dependence, uncomplicated: Secondary | ICD-10-CM | POA: Diagnosis present

## 2010-10-24 DIAGNOSIS — F319 Bipolar disorder, unspecified: Secondary | ICD-10-CM | POA: Diagnosis present

## 2010-10-24 DIAGNOSIS — G40909 Epilepsy, unspecified, not intractable, without status epilepticus: Secondary | ICD-10-CM | POA: Diagnosis present

## 2010-10-24 DIAGNOSIS — F10931 Alcohol use, unspecified with withdrawal delirium: Principal | ICD-10-CM | POA: Diagnosis present

## 2010-10-24 LAB — RAPID URINE DRUG SCREEN, HOSP PERFORMED
Amphetamines: POSITIVE — AB
Barbiturates: NOT DETECTED
Benzodiazepines: POSITIVE — AB
Cocaine: NOT DETECTED
Opiates: NOT DETECTED
Tetrahydrocannabinol: NOT DETECTED

## 2010-10-24 LAB — CBC
HCT: 42.4 % (ref 39.0–52.0)
Hemoglobin: 14 g/dL (ref 13.0–17.0)
MCH: 30.8 pg (ref 26.0–34.0)
MCHC: 33 g/dL (ref 30.0–36.0)
MCV: 93.2 fL (ref 78.0–100.0)
Platelets: 107 10*3/uL — ABNORMAL LOW (ref 150–400)
RBC: 4.55 MIL/uL (ref 4.22–5.81)
RDW: 13.5 % (ref 11.5–15.5)
WBC: 6.4 10*3/uL (ref 4.0–10.5)

## 2010-10-24 LAB — COMPREHENSIVE METABOLIC PANEL
ALT: 129 U/L — ABNORMAL HIGH (ref 0–53)
AST: 169 U/L — ABNORMAL HIGH (ref 0–37)
Albumin: 4.4 g/dL (ref 3.5–5.2)
Alkaline Phosphatase: 96 U/L (ref 39–117)
BUN: 5 mg/dL — ABNORMAL LOW (ref 6–23)
CO2: 30 mEq/L (ref 19–32)
Calcium: 9.4 mg/dL (ref 8.4–10.5)
Chloride: 96 mEq/L (ref 96–112)
Creatinine, Ser: 0.53 mg/dL (ref 0.50–1.35)
GFR calc Af Amer: >90 mL/min (ref >90)
GFR calc non Af Amer: >90 mL/min (ref >90)
Glucose, Bld: 115 mg/dL — ABNORMAL HIGH (ref 70–99)
Potassium: 3.9 mEq/L (ref 3.5–5.1)
Sodium: 137 mEq/L (ref 135–145)
Total Bilirubin: 0.8 mg/dL (ref 0.3–1.2)
Total Protein: 7.6 g/dL (ref 6.0–8.3)

## 2010-10-24 LAB — PHENYTOIN LEVEL, TOTAL: Phenytoin Lvl: 5.3 ug/mL — ABNORMAL LOW (ref 10.0–20.0)

## 2010-10-24 LAB — ETHANOL: Alcohol, Ethyl (B): <11 mg/dL (ref 0–11)

## 2010-10-24 MED ORDER — FOLIC ACID 1 MG PO TABS
1.0000 mg | ORAL_TABLET | Freq: Every day | ORAL | Status: DC
  Administered 2010-10-25 – 2010-11-05 (×12): 1 mg via ORAL
  Filled 2010-10-24 (×12): qty 1

## 2010-10-24 MED ORDER — THIAMINE HCL 100 MG/ML IJ SOLN
INTRAMUSCULAR | Status: AC
  Filled 2010-10-24: qty 2

## 2010-10-24 MED ORDER — GABAPENTIN 300 MG PO CAPS
600.0000 mg | ORAL_CAPSULE | Freq: Three times a day (TID) | ORAL | Status: DC
  Administered 2010-10-24 – 2010-11-05 (×35): 600 mg via ORAL
  Filled 2010-10-24 (×34): qty 2

## 2010-10-24 MED ORDER — SODIUM CHLORIDE 0.9 % IV SOLN
1000.0000 mg | Freq: Once | INTRAVENOUS | Status: DC
  Filled 2010-10-24: qty 20

## 2010-10-24 MED ORDER — FOLIC ACID 5 MG/ML IJ SOLN
INTRAMUSCULAR | Status: AC
  Filled 2010-10-24: qty 0.2

## 2010-10-24 MED ORDER — PANTOPRAZOLE SODIUM 40 MG PO TBEC
40.0000 mg | DELAYED_RELEASE_TABLET | Freq: Every day | ORAL | Status: DC
  Administered 2010-10-24 – 2010-11-05 (×12): 40 mg via ORAL
  Filled 2010-10-24 (×11): qty 1

## 2010-10-24 MED ORDER — ENOXAPARIN SODIUM 40 MG/0.4ML ~~LOC~~ SOLN
40.0000 mg | Freq: Two times a day (BID) | SUBCUTANEOUS | Status: DC
  Administered 2010-10-24 – 2010-10-26 (×4): 40 mg via SUBCUTANEOUS
  Filled 2010-10-24 (×4): qty 0.4

## 2010-10-24 MED ORDER — LORAZEPAM 2 MG/ML IJ SOLN
0.0000 mg | Freq: Two times a day (BID) | INTRAMUSCULAR | Status: DC
Start: 2010-10-26 — End: 2010-10-27
  Filled 2010-10-24: qty 1

## 2010-10-24 MED ORDER — SODIUM CHLORIDE 0.9 % IV BOLUS (SEPSIS)
500.0000 mL | Freq: Once | INTRAVENOUS | Status: AC
  Administered 2010-10-24: 16:00:00 via INTRAVENOUS

## 2010-10-24 MED ORDER — PHENYTOIN SODIUM 50 MG/ML IJ SOLN
INTRAMUSCULAR | Status: AC
  Filled 2010-10-24: qty 20

## 2010-10-24 MED ORDER — LORATADINE 10 MG PO TABS
10.0000 mg | ORAL_TABLET | Freq: Every day | ORAL | Status: DC
  Administered 2010-10-25 – 2010-11-05 (×12): 10 mg via ORAL
  Filled 2010-10-24 (×12): qty 1

## 2010-10-24 MED ORDER — LORAZEPAM 2 MG/ML IJ SOLN
0.0000 mg | Freq: Four times a day (QID) | INTRAMUSCULAR | Status: AC
Start: 2010-10-24 — End: 2010-10-26
  Administered 2010-10-24 – 2010-10-26 (×5): 2 mg via INTRAVENOUS
  Filled 2010-10-24 (×5): qty 1
  Filled 2010-10-24: qty 2

## 2010-10-24 MED ORDER — DEXTROSE-NACL 5-0.45 % IV SOLN
INTRAVENOUS | Status: DC
  Administered 2010-10-25 – 2010-10-26 (×2): 1000 mL via INTRAVENOUS

## 2010-10-24 MED ORDER — LORAZEPAM 1 MG PO TABS
1.0000 mg | ORAL_TABLET | Freq: Four times a day (QID) | ORAL | Status: DC | PRN
  Administered 2010-10-26 – 2010-10-27 (×3): 1 mg via ORAL
  Filled 2010-10-24 (×4): qty 1

## 2010-10-24 MED ORDER — VITAMIN B-1 100 MG PO TABS
100.0000 mg | ORAL_TABLET | Freq: Every day | ORAL | Status: DC
  Administered 2010-10-25 – 2010-11-05 (×11): 100 mg via ORAL
  Filled 2010-10-24 (×11): qty 1

## 2010-10-24 MED ORDER — LORAZEPAM 2 MG/ML IJ SOLN
1.0000 mg | Freq: Four times a day (QID) | INTRAMUSCULAR | Status: DC | PRN
  Administered 2010-10-24 – 2010-10-25 (×5): 1 mg via INTRAVENOUS
  Filled 2010-10-24 (×5): qty 1

## 2010-10-24 MED ORDER — THERA M PLUS PO TABS
1.0000 | ORAL_TABLET | Freq: Every day | ORAL | Status: DC
  Administered 2010-10-25 – 2010-11-05 (×11): 1 via ORAL
  Filled 2010-10-24 (×11): qty 1

## 2010-10-24 MED ORDER — SODIUM CHLORIDE 0.9 % IV SOLN
1000.0000 mg | Freq: Once | INTRAVENOUS | Status: DC
  Administered 2010-10-24: 1000 mg via INTRAVENOUS
  Filled 2010-10-24: qty 20

## 2010-10-24 MED ORDER — DULOXETINE HCL 60 MG PO CPEP
60.0000 mg | ORAL_CAPSULE | Freq: Every day | ORAL | Status: DC
  Administered 2010-10-24 – 2010-11-05 (×11): 60 mg via ORAL
  Filled 2010-10-24 (×12): qty 1

## 2010-10-24 MED ORDER — M.V.I. ADULT IV INJ
INJECTION | INTRAVENOUS | Status: AC
  Filled 2010-10-24: qty 10

## 2010-10-24 MED ORDER — THIAMINE HCL 100 MG/ML IJ SOLN
Freq: Once | INTRAVENOUS | Status: AC
  Administered 2010-10-24: 15:00:00 via INTRAVENOUS
  Filled 2010-10-24: qty 1000

## 2010-10-24 MED ORDER — LORAZEPAM 2 MG/ML IJ SOLN
2.0000 mg | Freq: Once | INTRAMUSCULAR | Status: AC
  Administered 2010-10-24: 2 mg via INTRAVENOUS
  Filled 2010-10-24: qty 1

## 2010-10-24 MED ORDER — THIAMINE HCL 100 MG/ML IJ SOLN: 100.0000 mg | Freq: Every day | INTRAMUSCULAR | Status: DC

## 2010-10-24 NOTE — ED Notes (Signed)
Pt states he is sober today and is ready to get help with alcohol. Last drank alcohol yesterday morning. Pt was seen here Friday and was later discharged. Pt was intoxicated during last visit.
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
## 2010-10-24 NOTE — ED Notes (Signed)
Pt states he is here today to quit drinking alcohol. Pt states he last drank yesterday am.

## 2010-10-25 MED ORDER — CLONIDINE HCL 0.1 MG PO TABS
0.1000 mg | ORAL_TABLET | Freq: Three times a day (TID) | ORAL | Status: DC
  Administered 2010-10-25 – 2010-11-04 (×24): 0.1 mg via ORAL
  Filled 2010-10-25 (×27): qty 1

## 2010-10-25 MED ORDER — NICOTINE 21 MG/24HR TD PT24
21.0000 mg | MEDICATED_PATCH | Freq: Every day | TRANSDERMAL | Status: DC
  Administered 2010-10-25: 21 mg via TRANSDERMAL
  Filled 2010-10-25: qty 1

## 2010-10-25 MED ORDER — SODIUM CHLORIDE 0.9 % IJ SOLN
INTRAMUSCULAR | Status: AC
  Filled 2010-10-25: qty 10

## 2010-10-25 MED ORDER — ZOLPIDEM TARTRATE 5 MG PO TABS
10.0000 mg | ORAL_TABLET | Freq: Every evening | ORAL | Status: DC | PRN
  Administered 2010-10-25: 10 mg via ORAL
  Filled 2010-10-25: qty 2

## 2010-10-25 MED ORDER — PHENYTOIN SODIUM EXTENDED 100 MG PO CAPS
300.0000 mg | ORAL_CAPSULE | Freq: Every day | ORAL | Status: DC
  Administered 2010-10-25 – 2010-11-04 (×10): 300 mg via ORAL
  Filled 2010-10-25 (×10): qty 3
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
## 2010-10-25 NOTE — Progress Notes (Signed)
Subjective: He is admitted with alcohol withdrawal. He has been following with a psychiatrist and has a recommendation to a alcohol rehabilitation center in [place] he did have a seizure and says he still somewhat shaky.  Objective: Vital signs in last 24 hours: Temp:  [98.6 F (37 C)-99.2 F (37.3 C)] 99 F (37.2 C) (10/15 0629) Pulse Rate:  [72-96] 82  (10/15 0629) Resp:  [20-24] 22  (10/15 0629) BP: (144-165)/(93-116) 145/104 mmHg (10/15 0629) SpO2:  [96 %-100 %] 96 % (10/15 0629) Weight:  [77.1 kg (169 lb 15.6 oz)-84 kg (185 lb 3 oz)] 185 lb 3 oz (84 kg) (10/15 0629) Weight change:  Last BM Date: 10/22/10  Intake/Output from previous day:    PHYSICAL EXAM General appearance: alert, cooperative and Anxious Resp: clear to auscultation bilaterally Cardio: regular rate and rhythm, S1, S2 normal, no murmur, click, rub or gallop GI: soft, non-tender; bowel sounds normal; no masses,  no organomegaly Extremities: extremities normal, atraumatic, no cyanosis or edema  Lab Results:    Basic Metabolic Panel:  Basename 10/24/10 1336 10/22/10 1500  NA 137 144  K 3.9 3.9  CL 96 100  CO2 30 26  GLUCOSE 115* 83  BUN 5* 6  CREATININE 0.53 0.56  CALCIUM 9.4 8.8  MG -- --  PHOS -- --   Liver Function Tests:  Basename 10/24/10 1336 10/22/10 1500  AST 169* 273*  ALT 129* 160*  ALKPHOS 96 101  BILITOT 0.8 0.7  PROT 7.6 8.1  ALBUMIN 4.4 4.5   No results found for this basename: LIPASE:2,AMYLASE:2 in the last 72 hours No results found for this basename: AMMONIA:2 in the last 72 hours CBC:  Basename 10/24/10 1336 10/22/10 1500  WBC 6.4 4.4  NEUTROABS -- --  HGB 14.0 14.3  HCT 42.4 43.6  MCV 93.2 93.6  PLT 107* 132*   Cardiac Enzymes: No results found for this basename: CKTOTAL:3,CKMB:3,CKMBINDEX:3,TROPONINI:3 in the last 72 hours BNP: No results found for this basename: POCBNP:3 in the last 72 hours D-Dimer: No results found for this basename: DDIMER:2 in the last 72  hours CBG: No results found for this basename: GLUCAP:6 in the last 72 hours Hemoglobin A1C: No results found for this basename: HGBA1C in the last 72 hours Fasting Lipid Panel: No results found for this basename: CHOL,HDL,LDLCALC,TRIG,CHOLHDL,LDLDIRECT in the last 72 hours Thyroid Function Tests: No results found for this basename: TSH,T4TOTAL,FREET4,T3FREE,THYROIDAB in the last 72 hours Anemia Panel: No results found for this basename: VITAMINB12,FOLATE,FERRITIN,TIBC,IRON,RETICCTPCT in the last 72 hours Urine Drug Screen:  Alcohol Level:  Basename 10/24/10 1336 10/22/10 1500  ETH <11 444*   Urinalysis:  Misc. Labs:  ABGS No results found for this basename: PHART,PCO2,PO2ART,TCO2,HCO3 in the last 72 hours CULTURES No results found for this or any previous visit (from the past 240 hour(s)). Studies/Results: No results found.  Medications:  Scheduled:   . DULoxetine  60 mg Oral Daily  . enoxaparin  40 mg Subcutaneous Q12H  . folic acid  1 mg Oral Daily  . gabapentin  600 mg Oral TID  . loratadine  10 mg Oral Daily  . LORazepam  0-4 mg Intravenous Q6H   Followed by  . LORazepam  0-4 mg Intravenous Q12H  . LORazepam  2 mg Intravenous Once  . multivitamins ther. w/minerals  1 tablet Oral Daily  . pantoprazole  40 mg Oral Q1200  . phenytoin (DILANTIN) IV  1,000 mg Intravenous Once  . banana bag IV fluid 1000 mL   Intravenous  Once  . sodium chloride  500 mL Intravenous Once  . vitamin B-1  100 mg Oral Daily   Or  . thiamine  100 mg Intravenous Daily  . DISCONTD: phenytoin (DILANTIN) IV  1,000 mg Intravenous Once   Continuous:   . dextrose 5 % and 0.45% NaCl 1,000 mL (10/25/10 0444)   ZOX:[id number], LORazepam  Assesment: He is admitted with chronic alcohol abuse and withdrawal syndrome. He is requesting placement in a treatment facility. He also has a history of narcotic abuse. He has what is probably posttraumatic stress disorder Active Problems:  * No active  hospital problems. *     Plan: I discussed with social worker and we'll see if we can get him placed somewhere for help    LOS: 1 day   Lennie Vasco L 10/25/2010, 8:51 AM

## 2010-10-25 NOTE — Progress Notes (Signed)
UR chart review completed.  

## 2010-10-25 NOTE — Progress Notes (Signed)
Please address code status of patient.  Thanks. 

## 2010-10-26 LAB — BASIC METABOLIC PANEL
BUN: 6 mg/dL (ref 6–23)
CO2: 27 mEq/L (ref 19–32)
Calcium: 9.7 mg/dL (ref 8.4–10.5)
Chloride: 98 mEq/L (ref 96–112)
Creatinine, Ser: 0.58 mg/dL (ref 0.50–1.35)
GFR calc Af Amer: >90 mL/min (ref >90)
GFR calc non Af Amer: >90 mL/min (ref >90)
Glucose, Bld: 100 mg/dL — ABNORMAL HIGH (ref 70–99)
Potassium: 4.5 mEq/L (ref 3.5–5.1)
Sodium: 136 mEq/L (ref 135–145)

## 2010-10-26 LAB — MAGNESIUM: Magnesium: 1.6 mg/dL (ref 1.5–2.5)

## 2010-10-26 LAB — LITHIUM LEVEL: Lithium Lvl: <0.25 mEq/L — ABNORMAL LOW (ref 0.80–1.40)

## 2010-10-26 MED ORDER — BUPRENORPHINE HCL 8 MG SL SUBL: 8.0000 mg | SUBLINGUAL_TABLET | Freq: Three times a day (TID) | SUBLINGUAL | Status: DC

## 2010-10-26 MED ORDER — BUPRENORPHINE HCL-NALOXONE HCL 8-2 MG SL SUBL
1.0000 | SUBLINGUAL_TABLET | Freq: Three times a day (TID) | SUBLINGUAL | Status: DC
  Administered 2010-10-26 – 2010-11-05 (×29): 1 via SUBLINGUAL
  Filled 2010-10-26 (×30): qty 1

## 2010-10-26 MED ORDER — LITHIUM CARBONATE 300 MG PO CAPS
ORAL_CAPSULE | ORAL | Status: AC
  Filled 2010-10-26: qty 1

## 2010-10-26 MED ORDER — LITHIUM CARBONATE ER 300 MG PO TBCR
300.0000 mg | EXTENDED_RELEASE_TABLET | Freq: Two times a day (BID) | ORAL | Status: DC
  Administered 2010-10-26 – 2010-11-05 (×18): 300 mg via ORAL
  Filled 2010-10-26 (×28): qty 1

## 2010-10-26 MED ORDER — BUPRENORPHINE HCL-NALOXONE HCL 8-2 MG SL FILM
8.0000 mg | ORAL_FILM | Freq: Three times a day (TID) | SUBLINGUAL | Status: DC
  Filled 2010-10-26 (×4): qty 1

## 2010-10-26 MED ORDER — SODIUM CHLORIDE 0.9 % IJ SOLN
INTRAMUSCULAR | Status: AC
  Filled 2010-10-26: qty 6

## 2010-10-26 MED ORDER — SODIUM CHLORIDE 0.9 % IJ SOLN
INTRAMUSCULAR | Status: AC
  Filled 2010-10-26: qty 3

## 2010-10-26 MED ORDER — ENOXAPARIN SODIUM 40 MG/0.4ML ~~LOC~~ SOLN
40.0000 mg | Freq: Every day | SUBCUTANEOUS | Status: DC
  Administered 2010-10-28 – 2010-11-05 (×6): 40 mg via SUBCUTANEOUS
  Filled 2010-10-26 (×8): qty 0.4

## 2010-10-26 MED ORDER — NICOTINE 21 MG/24HR TD PT24
21.0000 mg | MEDICATED_PATCH | Freq: Every day | TRANSDERMAL | Status: DC
  Administered 2010-10-26 – 2010-11-05 (×11): 21 mg via TRANSDERMAL
  Filled 2010-10-26 (×11): qty 1

## 2010-10-26 MED ORDER — HALOPERIDOL LACTATE 5 MG/ML IJ SOLN
5.0000 mg | Freq: Once | INTRAMUSCULAR | Status: AC
  Administered 2010-10-26: 5 mg via INTRAMUSCULAR
  Filled 2010-10-26: qty 1

## 2010-10-26 MED ORDER — LORAZEPAM 2 MG/ML IJ SOLN
1.0000 mg | Freq: Once | INTRAMUSCULAR | Status: AC
  Administered 2010-10-26: 1 mg via INTRAMUSCULAR

## 2010-10-26 NOTE — Progress Notes (Signed)
At approximately 0300, patient became very restless, with hallucinations, verbally abusive to staff and his mother, was trying to take out IV (took out another one earlier), pacing the room.  Gave the other 2 mg of 2-4 mg ordered as his earlier CIWA score was only 14.  He had Ambien ordered earlier and seems to be completely unaware of things that were discussed or his treatment plan at this point.  He wants to leave AMA.  His mother does not want him to sign out, but we explained that we cannot keep him against his will.  Three pages to Dr. Janna Arch.  Waiting on a call back.

## 2010-10-26 NOTE — Progress Notes (Signed)
Patient has tried to leave several times tonight and we have been able to calm him into staying.  The order for the 4-point restraints has been entered to use PRN if needed.  The patient does not want his mother here, but does not want to tell her that.  Security is aware of the situation and will be available to put on the restraints if the need arises.  Patient's CIWA is scheduled to be reassessed at 05:45.

## 2010-10-26 NOTE — Consult Note (Signed)
CSW faxed referral to [facility] for inpatient treatment.  Awaiting return call regarding bed availability.  CSW to continue to follow and will contact [facility] again in the morning to see if bed is available.  Karn Cassis

## 2010-10-26 NOTE — Progress Notes (Signed)
Subjective: He became very confused last night which apparently was a response to Ambien. He attempted to leave on several occasions but has stayed. He is better now but still pretty agitated. He has been seen by our Child psychotherapist and we're working on plans to try to get him to an alcohol rehabilitation facility.  Objective: Vital signs in last 24 hours: Temp:  [98 F (36.7 C)-98.4 F (36.9 C)] 98 F (36.7 C) (10/16 0700) Pulse Rate:  [70-127] 88  (10/16 0700) Resp:  [18-20] 18  (10/16 0014) BP: (137-162)/(92-117) 159/117 mmHg (10/16 0700) SpO2:  [93 %-98 %] 98 % (10/16 0700) Weight change:  Last BM Date: 10/25/10  Intake/Output from previous day: 10/15 0701 - 10/16 0700 In: 1426.7 [I.V.:1426.7] Out: -   PHYSICAL EXAM General appearance: alert and Agitated and anxious Resp: clear to auscultation bilaterally Cardio: regular rate and rhythm, S1, S2 normal, no murmur, click, rub or gallop GI: soft, non-tender; bowel sounds normal; no masses,  no organomegaly Extremities: extremities normal, atraumatic, no cyanosis or edema  Lab Results:    Basic Metabolic Panel:  Basename 10/26/10 0613 10/24/10 1336  NA 136 137  K 4.5 3.9  CL 98 96  CO2 27 30  GLUCOSE 100* 115*  BUN 6 5*  CREATININE 0.58 0.53  CALCIUM 9.7 9.4  MG 1.6 --  PHOS -- --   Liver Function Tests:  Basename 10/24/10 1336  AST 169*  ALT 129*  ALKPHOS 96  BILITOT 0.8  PROT 7.6  ALBUMIN 4.4   No results found for this basename: LIPASE:2,AMYLASE:2 in the last 72 hours No results found for this basename: AMMONIA:2 in the last 72 hours CBC:  Basename 10/24/10 1336  WBC 6.4  NEUTROABS --  HGB 14.0  HCT 42.4  MCV 93.2  PLT 107*   Cardiac Enzymes: No results found for this basename: CKTOTAL:3,CKMB:3,CKMBINDEX:3,TROPONINI:3 in the last 72 hours BNP: No results found for this basename: POCBNP:3 in the last 72 hours D-Dimer: No results found for this basename: DDIMER:2 in the last 72 hours CBG: No  results found for this basename: GLUCAP:6 in the last 72 hours Hemoglobin A1C: No results found for this basename: HGBA1C in the last 72 hours Fasting Lipid Panel: No results found for this basename: CHOL,HDL,LDLCALC,TRIG,CHOLHDL,LDLDIRECT in the last 72 hours Thyroid Function Tests: No results found for this basename: TSH,T4TOTAL,FREET4,T3FREE,THYROIDAB in the last 72 hours Anemia Panel: No results found for this basename: VITAMINB12,FOLATE,FERRITIN,TIBC,IRON,RETICCTPCT in the last 72 hours Urine Drug Screen:  Alcohol Level:  Basename 10/24/10 1336  ETH <11   Urinalysis:  Misc. Labs:  ABGS No results found for this basename: PHART,PCO2,PO2ART,TCO2,HCO3 in the last 72 hours CULTURES No results found for this or any previous visit (from the past 240 hour(s)). Studies/Results: No results found.  Medications:  Scheduled:   . buprenorphine-naloxone  1 tablet Sublingual TID  . cloNIDine  0.1 mg Oral TID  . DULoxetine  60 mg Oral Daily  . enoxaparin  40 mg Subcutaneous Q12H  . folic acid  1 mg Oral Daily  . gabapentin  600 mg Oral TID  . loratadine  10 mg Oral Daily  . LORazepam  0-4 mg Intravenous Q6H   Followed by  . LORazepam  0-4 mg Intravenous Q12H  . LORazepam  1 mg Intramuscular Once  . multivitamins ther. w/minerals  1 tablet Oral Daily  . nicotine  21 mg Transdermal Daily  . pantoprazole  40 mg Oral Q1200  . phenytoin (DILANTIN) IV  1,000 mg  Intravenous Once  . phenytoin  300 mg Oral QHS  . sodium chloride      . vitamin B-1  100 mg Oral Daily   Or  . thiamine  100 mg Intravenous Daily  . DISCONTD: buprenorphine  8 mg Sublingual TID  . DISCONTD: Buprenorphine HCl-Naloxone HCl  8 mg Sublingual TID  . DISCONTD: nicotine  21 mg Transdermal Daily   Continuous:   . dextrose 5 % and 0.45% NaCl 1,000 mL (10/26/10 0204)   [id number], LORazepam, zolpidem  Assesment: He is having problems with alcohol withdrawal and problems with chronic abuse of alcohol and is  requesting that he be sent to a rehabilitation facility. He has other medical problems and has had a problem with previous narcotic abuse. He has been in a clinic in receiving treatment for that. He has had a reaction to Ambien which obviously will be discontinued Active Problems:  * No active hospital problems. *     Plan: Continue his treatments try to get into a rehabilitation facility discontinue Ambien    LOS: 2 days   Fallon Haecker L 10/26/2010, 9:11 AM

## 2010-10-26 NOTE — Progress Notes (Signed)
Spoke with Dr. Janna Arch.  Told him about patient.  He gave orders for 4 point restraints, transfer to ICU and 1 mg Ativan IV or IM.    No beds in ICU and none to become available soon.  Talked again with Dr. Janna Arch.  He stated to leave him where he was.  I explained that he was a little calmer now.  Give Ativan as ordered and get BMET and mag level in the morning.

## 2010-10-27 LAB — MRSA PCR SCREENING: MRSA by PCR: NEGATIVE

## 2010-10-27 MED ORDER — LORAZEPAM 2 MG/ML IJ SOLN
1.0000 mg | INTRAMUSCULAR | Status: DC | PRN
  Administered 2010-10-27 – 2010-10-29 (×20): 2 mg via INTRAVENOUS
  Filled 2010-10-27 (×6): qty 1
  Filled 2010-10-27 (×2): qty 2
  Filled 2010-10-27 (×2): qty 1
  Filled 2010-10-27: qty 2
  Filled 2010-10-27 (×6): qty 1

## 2010-10-27 MED ORDER — HALOPERIDOL LACTATE 5 MG/ML IJ SOLN
5.0000 mg | INTRAMUSCULAR | Status: DC | PRN
  Administered 2010-11-04: 5 mg via INTRAMUSCULAR
  Filled 2010-10-27: qty 1

## 2010-10-27 MED ORDER — DEXMEDETOMIDINE HCL 100 MCG/ML IV SOLN
0.2000 ug/kg/h | INTRAVENOUS | Status: DC
  Administered 2010-10-27: 0.7 ug/kg/h via INTRAVENOUS
  Administered 2010-10-27: 0.6 ug/kg/h via INTRAVENOUS
  Filled 2010-10-27: qty 2

## 2010-10-27 MED ORDER — SODIUM CHLORIDE 0.9 % IV SOLN
0.2000 ug/kg/h | INTRAVENOUS | Status: DC
  Administered 2010-10-27: 0.5 ug/kg/h via INTRAVENOUS
  Administered 2010-10-28: 0.7 ug/kg/h via INTRAVENOUS
  Filled 2010-10-27 (×2): qty 4

## 2010-10-27 MED ORDER — CLONIDINE HCL 0.3 MG/24HR TD PTWK
0.3000 mg | MEDICATED_PATCH | TRANSDERMAL | Status: DC
  Administered 2010-10-27 – 2010-11-03 (×2): 0.3 mg via TRANSDERMAL
  Filled 2010-10-27 (×2): qty 1

## 2010-10-27 MED ORDER — LORAZEPAM 0.5 MG PO TABS: 1.0000 mg | ORAL_TABLET | Freq: Four times a day (QID) | ORAL | Status: DC | PRN

## 2010-10-27 MED ORDER — SODIUM CHLORIDE 0.9 % IJ SOLN
INTRAMUSCULAR | Status: AC
  Filled 2010-10-27: qty 10

## 2010-10-27 MED ORDER — HALOPERIDOL LACTATE 5 MG/ML IJ SOLN
INTRAMUSCULAR | Status: AC
  Administered 2010-10-27: 11:00:00
  Filled 2010-10-27: qty 1

## 2010-10-27 MED ORDER — LORAZEPAM 1 MG PO TABS
ORAL_TABLET | ORAL | Status: AC
  Filled 2010-10-27: qty 4

## 2010-10-27 MED ORDER — LORAZEPAM 2 MG/ML IJ SOLN: 1.0000 mg | Freq: Four times a day (QID) | INTRAMUSCULAR | Status: DC | PRN

## 2010-10-27 MED ORDER — LORAZEPAM 2 MG/ML IJ SOLN: 0.0000 mg | Freq: Two times a day (BID) | INTRAMUSCULAR | Status: DC

## 2010-10-27 MED ORDER — ACETAMINOPHEN 325 MG PO TABS
ORAL_TABLET | ORAL | Status: AC
  Administered 2010-10-27: 650 mg via ORAL
  Filled 2010-10-27: qty 2

## 2010-10-27 MED ORDER — LORAZEPAM 1 MG PO TABS
0.0000 mg | ORAL_TABLET | Freq: Two times a day (BID) | ORAL | Status: DC | PRN
  Administered 2010-10-27 (×2): 4 mg via ORAL
  Filled 2010-10-27: qty 4

## 2010-10-27 MED ORDER — SODIUM CHLORIDE 0.9 % IV SOLN: 0.2000 ug/kg/h | INTRAVENOUS | Status: DC

## 2010-10-27 MED ORDER — ACETAMINOPHEN 325 MG PO TABS
650.0000 mg | ORAL_TABLET | ORAL | Status: DC | PRN
  Administered 2010-10-27 – 2010-11-05 (×5): 650 mg via ORAL
  Filled 2010-10-27 (×4): qty 2

## 2010-10-27 MED ORDER — SODIUM CHLORIDE 0.9 % IV SOLN
0.2000 ug/kg/h | INTRAVENOUS | Status: DC
  Administered 2010-10-27: 0.2 ug/kg/h via INTRAVENOUS
  Filled 2010-10-27 (×2): qty 2

## 2010-10-27 NOTE — Consult Note (Addendum)
Pt moved to ICU to start IV Precedex.  Will need to be reassessed at a later date to determine needs.    Dean [name]

## 2010-10-27 NOTE — Progress Notes (Signed)
Subjective: He has been very confused and somewhat paranoid  Objective: Vital signs in last 24 hours: Temp:  [97.9 F (36.6 C)-98.5 F (36.9 C)] 98.5 F (36.9 C) (10/17 0523) Pulse Rate:  [85-96] 85  (10/17 0523) Resp:  [20] 20  (10/17 0523) BP: (138-156)/(88-99) 156/98 mmHg (10/17 0523) SpO2:  [92 %-97 %] 96 % (10/17 0523) Weight change:  Last BM Date: 10/27/10  Intake/Output from previous day: 10/16 0701 - 10/17 0700 In: 600 [P.O.:600] Out: 1 [Stool:1]  PHYSICAL EXAM General appearance: delirious Resp: clear to auscultation bilaterally Cardio: regular rate and rhythm, S1, S2 normal, no murmur, click, rub or gallop GI: soft, non-tender; bowel sounds normal; no masses,  no organomegaly Extremities: extremities normal, atraumatic, no cyanosis or edema  Lab Results:    Basic Metabolic Panel:  Basename 10/26/10 0613 10/24/10 1336  NA 136 137  K 4.5 3.9  CL 98 96  CO2 27 30  GLUCOSE 100* 115*  BUN 6 5*  CREATININE 0.58 0.53  CALCIUM 9.7 9.4  MG 1.6 --  PHOS -- --   Liver Function Tests:  Basename 10/24/10 1336  AST 169*  ALT 129*  ALKPHOS 96  BILITOT 0.8  PROT 7.6  ALBUMIN 4.4   No results found for this basename: LIPASE:2,AMYLASE:2 in the last 72 hours No results found for this basename: AMMONIA:2 in the last 72 hours CBC:  Basename 10/24/10 1336  WBC 6.4  NEUTROABS --  HGB 14.0  HCT 42.4  MCV 93.2  PLT 107*   Cardiac Enzymes: No results found for this basename: CKTOTAL:3,CKMB:3,CKMBINDEX:3,TROPONINI:3 in the last 72 hours BNP: No results found for this basename: POCBNP:3 in the last 72 hours D-Dimer: No results found for this basename: DDIMER:2 in the last 72 hours CBG: No results found for this basename: GLUCAP:6 in the last 72 hours Hemoglobin A1C: No results found for this basename: HGBA1C in the last 72 hours Fasting Lipid Panel: No results found for this basename: CHOL,HDL,LDLCALC,TRIG,CHOLHDL,LDLDIRECT in the last 72 hours Thyroid  Function Tests: No results found for this basename: TSH,T4TOTAL,FREET4,T3FREE,THYROIDAB in the last 72 hours Anemia Panel: No results found for this basename: VITAMINB12,FOLATE,FERRITIN,TIBC,IRON,RETICCTPCT in the last 72 hours Urine Drug Screen:  Alcohol Level:  Basename 10/24/10 1336  ETH <11   Urinalysis:  Misc. Labs:  ABGS No results found for this basename: PHART,PCO2,PO2ART,TCO2,HCO3 in the last 72 hours CULTURES No results found for this or any previous visit (from the past 240 hour(s)). Studies/Results: No results found.  Medications:  Scheduled:   . buprenorphine-naloxone  1 tablet Sublingual TID  . cloNIDine  0.1 mg Oral TID  . DULoxetine  60 mg Oral Daily  . enoxaparin  40 mg Subcutaneous Daily  . folic acid  1 mg Oral Daily  . gabapentin  600 mg Oral TID  . haloperidol lactate  5 mg Intramuscular Once  . lithium carbonate  300 mg Oral Q12H  . loratadine  10 mg Oral Daily  . LORazepam      . LORazepam  0-4 mg Intravenous Q6H   Followed by  . LORazepam  0-4 mg Intravenous Q12H  . multivitamins ther. w/minerals  1 tablet Oral Daily  . nicotine  21 mg Transdermal Daily  . pantoprazole  40 mg Oral Q1200  . phenytoin (DILANTIN) IV  1,000 mg Intravenous Once  . phenytoin  300 mg Oral QHS  . sodium chloride      . sodium chloride      . vitamin B-1  100 mg  Oral Daily   Or  . thiamine  100 mg Intravenous Daily  . DISCONTD: enoxaparin  40 mg Subcutaneous Q12H  . DISCONTD: LORazepam  0-4 mg Intravenous Q12H  . DISCONTD: LORazepam  0-4 mg Intravenous Q12H   Continuous:   . dextrose 5 % and 0.45% NaCl 1,000 mL (10/26/10 0204)   ZOX:[id number], LORazepam, LORazepam, LORazepam, DISCONTD: LORazepam, DISCONTD: LORazepam, DISCONTD: zolpidem  Assesment: He has alcohol withdrawal syndrome but also has substantial mental illness. He is very confused and apparently. Active Problems:  * No active hospital problems. *     Plan: I would like to see if we can get  him to a facility that has psychiatric care available because he does not seem to be improving    LOS: 3 days   Ramyah Pankowski L 10/27/2010, 8:42 AM

## 2010-10-27 NOTE — Progress Notes (Signed)
Pt sedated. MD notified of bradycardia and hypertension. Titration of precedex not successful in increasing heart rate. PO medications not appropriate at this time. MD ordered clonidine patch. Clonidine patch applied to pt's left arm.

## 2010-10-27 NOTE — Progress Notes (Signed)
Pt escorted to floor by Rn. Pt alert and oriented to self but confused of time, place and situation. Pt suspicious of staff and visitors.

## 2010-10-27 NOTE — Progress Notes (Signed)
Patient very agitated this am,confused,and disoriented,had some periods of hallucination.Patient very suspicious of staff ,patient wondering in hallway.ACT team notified per MD order consult,orders received to transfer patient.   Transferred patient to ICU,report called,and given to [employer].Family at the bed side.Escorted by staff to floor.Patient transferred for higher level of care,patient will be started on Precedex.

## 2010-10-27 NOTE — Progress Notes (Signed)
Pt unable to take po d/t sedation. Pt continues to be bradycardic. No physical restraints in use at this time.

## 2010-10-27 NOTE — Progress Notes (Signed)
Patient has had a Statistician all night.  He has gone in and out of rational thoughts and wakes up after 10-15 from dreams.  Patient has stated "my mom is hurt and in an accident;" "I have been trapped and left at [employer]:" "my dog mimi has been trapped in a ditch I got to save her;" etc...  Patient gait at times I weak with left knee dragging at times.  Haldol dose did seem to help last night when given.  Patient takes several minutes to reorient and constantly has been walked throughout the night.  Patient has been taken to the vending machines and sitting in the cafeteria for a change of atmosphere.  Patient has called his mom a few times tonight to calm him but did well with her staying at home.  He also has called his girlfriend michelle.    Patient doesn't have IV access at this time and a prn 12 hours for CIWA PO was ordered if the IV access isn't available per on-call MD.

## 2010-10-28 LAB — BASIC METABOLIC PANEL
BUN: 9 mg/dL (ref 6–23)
CO2: 24 mEq/L (ref 19–32)
Calcium: 9.4 mg/dL (ref 8.4–10.5)
Chloride: 101 mEq/L (ref 96–112)
Creatinine, Ser: 0.58 mg/dL (ref 0.50–1.35)
GFR calc Af Amer: >90 mL/min (ref >90)
GFR calc non Af Amer: >90 mL/min (ref >90)
Glucose, Bld: 95 mg/dL (ref 70–99)
Potassium: 3.5 mEq/L (ref 3.5–5.1)
Sodium: 136 mEq/L (ref 135–145)

## 2010-10-28 LAB — DIFFERENTIAL
Basophils Absolute: 0 10*3/uL (ref 0.0–0.1)
Basophils Relative: 1 % (ref 0–1)
Eosinophils Absolute: 0.3 10*3/uL (ref 0.0–0.7)
Eosinophils Relative: 4 % (ref 0–5)
Lymphocytes Relative: 16 % (ref 12–46)
Lymphs Abs: 1.2 10*3/uL (ref 0.7–4.0)
Monocytes Absolute: 0.7 10*3/uL (ref 0.1–1.0)
Monocytes Relative: 9 % (ref 3–12)
Neutro Abs: 5.3 10*3/uL (ref 1.7–7.7)
Neutrophils Relative %: 70 % (ref 43–77)

## 2010-10-28 LAB — CBC
HCT: 42.2 % (ref 39.0–52.0)
Hemoglobin: 14.2 g/dL (ref 13.0–17.0)
MCH: 31.6 pg (ref 26.0–34.0)
MCHC: 33.6 g/dL (ref 30.0–36.0)
MCV: 93.8 fL (ref 78.0–100.0)
Platelets: 109 10*3/uL — ABNORMAL LOW (ref 150–400)
RBC: 4.5 MIL/uL (ref 4.22–5.81)
RDW: 12.8 % (ref 11.5–15.5)
WBC: 7.6 10*3/uL (ref 4.0–10.5)

## 2010-10-28 MED ORDER — SODIUM CHLORIDE 0.9 % IJ SOLN
INTRAMUSCULAR | Status: AC
  Filled 2010-10-28: qty 10

## 2010-10-28 MED ORDER — PHENYTOIN SODIUM EXTENDED 100 MG PO CAPS
100.0000 mg | ORAL_CAPSULE | Freq: Once | ORAL | Status: AC
  Administered 2010-10-28: 100 mg via ORAL
  Filled 2010-10-28: qty 1

## 2010-10-28 NOTE — Progress Notes (Signed)
Pt is temulous, "fidgity" and frequently requests to use the phone. MD notified of precedex being stopped and continuation of the q1hr ativan. MD wants to continue with current treatment.

## 2010-10-28 NOTE — Progress Notes (Signed)
Subjective: He has been treated for delirium tremens and is a little bit better. His current protocol we'll expire later today and I'm going to put him on Ativan. I am not certain that all of this is related to DTs because he also has significant mental illness of other types  Objective: Vital signs in last 24 hours: Temp:  [97.6 F (36.4 C)-97.9 F (36.6 C)] 97.6 F (36.4 C) (10/18 0400) Pulse Rate:  [43-121] 46  (10/18 0515) Resp:  [14-26] 18  (10/18 0515) BP: (93-160)/(59-108) 134/87 mmHg (10/18 0500) SpO2:  [94 %-100 %] 98 % (10/18 0515) Weight:  [82.3 kg (181 lb 7 oz)] 181 lb 7 oz (82.3 kg) (10/18 0500) Weight change:  Last BM Date: 10/27/10  Intake/Output from previous day: 10/17 0701 - 10/18 0700 In: 650.9 [P.O.:440; I.V.:210.9] Out: -   PHYSICAL EXAM General appearance: delirious and moderately obese Resp: clear to auscultation bilaterally Cardio: regular rate and rhythm, S1, S2 normal, no murmur, click, rub or gallop GI: soft, non-tender; bowel sounds normal; no masses,  no organomegaly Extremities: extremities normal, atraumatic, no cyanosis or edema  Lab Results:    Basic Metabolic Panel:  Basename 10/28/10 0444 10/26/10 0613  NA 136 136  K 3.5 4.5  CL 101 98  CO2 24 27  GLUCOSE 95 100*  BUN 9 6  CREATININE 0.58 0.58  CALCIUM 9.4 9.7  MG -- 1.6  PHOS -- --   Liver Function Tests: No results found for this basename: AST:2,ALT:2,ALKPHOS:2,BILITOT:2,PROT:2,ALBUMIN:2 in the last 72 hours No results found for this basename: LIPASE:2,AMYLASE:2 in the last 72 hours No results found for this basename: AMMONIA:2 in the last 72 hours CBC:  Basename 10/28/10 0444  WBC 7.6  NEUTROABS 5.3  HGB 14.2  HCT 42.2  MCV 93.8  PLT 109*   Cardiac Enzymes: No results found for this basename: CKTOTAL:3,CKMB:3,CKMBINDEX:3,TROPONINI:3 in the last 72 hours BNP: No results found for this basename: POCBNP:3 in the last 72 hours D-Dimer: No results found for this  basename: DDIMER:2 in the last 72 hours CBG: No results found for this basename: GLUCAP:6 in the last 72 hours Hemoglobin A1C: No results found for this basename: HGBA1C in the last 72 hours Fasting Lipid Panel: No results found for this basename: CHOL,HDL,LDLCALC,TRIG,CHOLHDL,LDLDIRECT in the last 72 hours Thyroid Function Tests: No results found for this basename: TSH,T4TOTAL,FREET4,T3FREE,THYROIDAB in the last 72 hours Anemia Panel: No results found for this basename: VITAMINB12,FOLATE,FERRITIN,TIBC,IRON,RETICCTPCT in the last 72 hours Urine Drug Screen:  Alcohol Level: No results found for this basename: ETH:2 in the last 72 hours Urinalysis:  Misc. Labs:  ABGS No results found for this basename: PHART,PCO2,PO2ART,TCO2,HCO3 in the last 72 hours CULTURES Recent Results (from the past 240 hour(s))  MRSA PCR SCREENING     Status: Normal   Collection Time   10/27/10  2:04 PM      Component Value Range Status Comment   MRSA by PCR NEGATIVE  NEGATIVE  Final    Studies/Results: No results found.  Medications:  Scheduled:   . buprenorphine-naloxone  1 tablet Sublingual TID  . cloNIDine  0.3 mg Transdermal Weekly  . cloNIDine  0.1 mg Oral TID  . DULoxetine  60 mg Oral Daily  . enoxaparin  40 mg Subcutaneous Daily  . folic acid  1 mg Oral Daily  . gabapentin  600 mg Oral TID  . haloperidol lactate      . lithium carbonate  300 mg Oral Q12H  . loratadine  10 mg  Oral Daily  . LORazepam      . multivitamins ther. w/minerals  1 tablet Oral Daily  . nicotine  21 mg Transdermal Daily  . pantoprazole  40 mg Oral Q1200  . phenytoin (DILANTIN) IV  1,000 mg Intravenous Once  . phenytoin  300 mg Oral QHS  . sodium chloride      . sodium chloride      . sodium chloride      . sodium chloride      . vitamin B-1  100 mg Oral Daily   Or  . thiamine  100 mg Intravenous Daily  . DISCONTD: LORazepam  0-4 mg Intravenous Q12H   Continuous:   . dexmedetomidine (PRECEDEX) IV  infusion for high rates 0.5 mcg/kg/hr (10/28/10 0732)  . dextrose 5 % and 0.45% NaCl 1,000 mL (10/26/10 0204)  . DISCONTD: dexmedetomidine (PRECEDEX) IV infusion 0.7 mcg/kg/hr (10/27/10 1207)  . DISCONTD: dexmedetomidine (PRECEDEX) IV infusion 0.6 mcg/kg/hr (10/27/10 1559)  . DISCONTD: dexmedetomidine (PRECEDEX) IV infusion     WUJ:[id number], haloperidol lactate, LORazepam, DISCONTD: LORazepam, DISCONTD: LORazepam, DISCONTD: LORazepam  Assesment: He has DTs, he has post traumatic stress disorder. Probably has bipolar disorder. He has been withdrawing from alcohol and been very agitated Active Problems:  * No active hospital problems. *     Plan: Continue with current treatments as above he will be back on lorazepam after the Precedex is completed    LOS: 4 days   Naidelin Gugliotta L 10/28/2010, 7:41 AM

## 2010-10-29 LAB — PHENYTOIN LEVEL, FREE AND TOTAL
Phenytoin Bound: 23.8 mg/L
Phenytoin, Free: 2.6 mg/L — ABNORMAL HIGH (ref 1.0–2.0)
Phenytoin, Total: 26.4 mg/L — ABNORMAL HIGH (ref 10.0–20.0)

## 2010-10-29 MED ORDER — SODIUM CHLORIDE 0.9 % IJ SOLN
INTRAMUSCULAR | Status: AC
  Filled 2010-10-29: qty 10

## 2010-10-29 MED ORDER — SODIUM CHLORIDE 0.9 % IJ SOLN
INTRAMUSCULAR | Status: AC
  Filled 2010-10-29: qty 20

## 2010-10-29 MED ORDER — SODIUM CHLORIDE 0.9 % IJ SOLN
INTRAMUSCULAR | Status: AC
  Administered 2010-10-29: 10 mL
  Filled 2010-10-29: qty 10

## 2010-10-29 MED ORDER — LORAZEPAM 1 MG PO TABS
2.0000 mg | ORAL_TABLET | ORAL | Status: DC | PRN
  Administered 2010-10-29 – 2010-11-05 (×80): 2 mg via ORAL
  Filled 2010-10-29: qty 4
  Filled 2010-10-29: qty 2
  Filled 2010-10-29 (×3): qty 4
  Filled 2010-10-29: qty 1
  Filled 2010-10-29 (×14): qty 4
  Filled 2010-10-29: qty 2
  Filled 2010-10-29: qty 4
  Filled 2010-10-29: qty 1
  Filled 2010-10-29: qty 2
  Filled 2010-10-29 (×2): qty 4
  Filled 2010-10-29: qty 2
  Filled 2010-10-29 (×4): qty 4
  Filled 2010-10-29: qty 2
  Filled 2010-10-29 (×3): qty 4
  Filled 2010-10-29: qty 2
  Filled 2010-10-29 (×5): qty 4
  Filled 2010-10-29: qty 2
  Filled 2010-10-29 (×2): qty 4
  Filled 2010-10-29: qty 3
  Filled 2010-10-29 (×4): qty 4
  Filled 2010-10-29 (×2): qty 2
  Filled 2010-10-29: qty 3
  Filled 2010-10-29 (×6): qty 4
  Filled 2010-10-29: qty 2
  Filled 2010-10-29 (×6): qty 4
  Filled 2010-10-29: qty 2
  Filled 2010-10-29 (×2): qty 4
  Filled 2010-10-29: qty 2
  Filled 2010-10-29 (×6): qty 4
  Filled 2010-10-29: qty 2
  Filled 2010-10-29 (×5): qty 4

## 2010-10-29 NOTE — Progress Notes (Signed)
Subjective: He is still tremulous but much better. His iv has infiltrated. He is ok so far on po meds.  Objective: Vital signs in last 24 hours: Temp:  [98.1 F (36.7 C)-98.6 F (37 C)] 98.3 F (36.8 C) (10/19 0400) Pulse Rate:  [42-75] 75  (10/18 1600) Resp:  [14-27] 24  (10/19 0600) BP: (98-156)/(64-118) 156/96 mmHg (10/19 0600) SpO2:  [96 %-97 %] 97 % (10/18 1600) Weight:  [81.5 kg (179 lb 10.8 oz)] 179 lb 10.8 oz (81.5 kg) (10/19 0500) Weight change: -0.8 kg (-1 lb 12.2 oz) Last BM Date: 10/28/10  Intake/Output from previous day: 10/18 0701 - 10/19 0700 In: 989.9 [P.O.:960; I.V.:29.9] Out: 1302 [Urine:1300; Stool:2]  PHYSICAL EXAM General appearance: alert and mild distress Resp: clear to auscultation bilaterally Cardio: regular rate and rhythm, S1, S2 normal, no murmur, click, rub or gallop GI: soft, non-tender; bowel sounds normal; no masses,  no organomegaly Extremities: extremities normal, atraumatic, no cyanosis or edema  Lab Results:    Basic Metabolic Panel:  Basename 10/28/10 0444  NA 136  K 3.5  CL 101  CO2 24  GLUCOSE 95  BUN 9  CREATININE 0.58  CALCIUM 9.4  MG --  PHOS --   Liver Function Tests: No results found for this basename: AST:2,ALT:2,ALKPHOS:2,BILITOT:2,PROT:2,ALBUMIN:2 in the last 72 hours No results found for this basename: LIPASE:2,AMYLASE:2 in the last 72 hours No results found for this basename: AMMONIA:2 in the last 72 hours CBC:  Basename 10/28/10 0444  WBC 7.6  NEUTROABS 5.3  HGB 14.2  HCT 42.2  MCV 93.8  PLT 109*   Cardiac Enzymes: No results found for this basename: CKTOTAL:3,CKMB:3,CKMBINDEX:3,TROPONINI:3 in the last 72 hours BNP: No results found for this basename: POCBNP:3 in the last 72 hours D-Dimer: No results found for this basename: DDIMER:2 in the last 72 hours CBG: No results found for this basename: GLUCAP:6 in the last 72 hours Hemoglobin A1C: No results found for this basename: HGBA1C in the last 72  hours Fasting Lipid Panel: No results found for this basename: CHOL,HDL,LDLCALC,TRIG,CHOLHDL,LDLDIRECT in the last 72 hours Thyroid Function Tests: No results found for this basename: TSH,T4TOTAL,FREET4,T3FREE,THYROIDAB in the last 72 hours Anemia Panel: No results found for this basename: VITAMINB12,FOLATE,FERRITIN,TIBC,IRON,RETICCTPCT in the last 72 hours Urine Drug Screen:  Alcohol Level: No results found for this basename: ETH:2 in the last 72 hours Urinalysis:  Misc. Labs:  ABGS No results found for this basename: PHART,PCO2,PO2ART,TCO2,HCO3 in the last 72 hours CULTURES Recent Results (from the past 240 hour(s))  MRSA PCR SCREENING     Status: Normal   Collection Time   10/27/10  2:04 PM      Component Value Range Status Comment   MRSA by PCR NEGATIVE  NEGATIVE  Final    Studies/Results: No results found.  Medications:  Scheduled:   . buprenorphine-naloxone  1 tablet Sublingual TID  . cloNIDine  0.3 mg Transdermal Weekly  . cloNIDine  0.1 mg Oral TID  . DULoxetine  60 mg Oral Daily  . enoxaparin  40 mg Subcutaneous Daily  . folic acid  1 mg Oral Daily  . gabapentin  600 mg Oral TID  . lithium carbonate  300 mg Oral Q12H  . loratadine  10 mg Oral Daily  . multivitamins ther. w/minerals  1 tablet Oral Daily  . nicotine  21 mg Transdermal Daily  . pantoprazole  40 mg Oral Q1200  . phenytoin (DILANTIN) IV  1,000 mg Intravenous Once  . phenytoin  100 mg Oral  Once  . phenytoin  300 mg Oral QHS  . sodium chloride      . sodium chloride      . sodium chloride      . sodium chloride      . sodium chloride      . sodium chloride      . sodium chloride      . sodium chloride      . vitamin B-1  100 mg Oral Daily   Or  . thiamine  100 mg Intravenous Daily   Continuous:   . dexmedetomidine (PRECEDEX) IV infusion for high rates Stopped (10/28/10 1136)  . dextrose 5 % and 0.45% NaCl 1,000 mL (10/26/10 0204)   AVW:[id number], haloperidol lactate, LORazepam,  LORazepam  Assesment:He has alcohol withdrawal and is on protocol.He is now on po meds. He is close to being ready for transfer Active Problems:  * No active hospital problems. *     Plan:I will ask the ACT team to reassess.    LOS: 5 days   Dean [name] L 10/29/2010, 7:57 AM

## 2010-10-29 NOTE — Progress Notes (Signed)
At 1600 Spoke to person at [facility] and admitting pt. Gave requested information and then transferred call to pt so pt could answer some questions from [facility]. Family member and pt came out of room and stated "they stop accepting patients on Friday at 1530 and don't admit again till Monday at 0930. Are we supposed to stay till Monday?" Told pt and family member I would speak to Case Management.  At 1635 spoke to [facility] from Case Management. Roe Coombs stated that depending on [facility]'s admitting regulations that pt may have to spend the weekend here or could be d/c and go to [facility] on Monday.  At 1638 spoke to [facility] from [place]. Pt does not have to be admitted straight from hospital to [facility], that pt can come from home.   The phone number to [facility] [place] is [phone]

## 2010-10-30 DIAGNOSIS — F10231 Alcohol dependence with withdrawal delirium: Secondary | ICD-10-CM | POA: Diagnosis present

## 2010-10-30 DIAGNOSIS — F319 Bipolar disorder, unspecified: Secondary | ICD-10-CM | POA: Diagnosis present

## 2010-10-30 DIAGNOSIS — F4310 Post-traumatic stress disorder, unspecified: Secondary | ICD-10-CM | POA: Diagnosis present

## 2010-10-30 NOTE — Progress Notes (Signed)
Subjective: He is doing better as far as his alcohol withdrawal and mental illness. He is still requiring large amounts of lorazepam but less than yesterday. He is still somewhat tremulous but less than yesterday. He was reevaluated by representatives from the [facility] and they told him that he must initiate going to an alcohol rehabilitation treatment center. I do not think it's safe for him to be discharged from here because I think he will be back on alcohol.  Objective: Vital signs in last 24 hours: Temp:  [97.5 F (36.4 C)-98.4 F (36.9 C)] 98.4 F (36.9 C) (10/20 0400) Resp:  [15-26] 17  (10/20 0600) BP: (104-167)/(57-110) 131/76 mmHg (10/20 0600) Weight:  [83.1 kg (183 lb 3.2 oz)] 183 lb 3.2 oz (83.1 kg) (10/20 0500) Weight change: 1.6 kg (3 lb 8.4 oz) Last BM Date: 10/28/10  Intake/Output from previous day: 10/19 0701 - 10/20 0700 In: 1061 [P.O.:1060] Out: 500 [Urine:500]  PHYSICAL EXAM General appearance: alert, cooperative and mild distress Resp: clear to auscultation bilaterally Cardio: regular rate and rhythm, S1, S2 normal, no murmur, click, rub or gallop GI: soft, non-tender; bowel sounds normal; no masses,  no organomegaly  Lab Results:    Basic Metabolic Panel:  Basename 10/28/10 0444  NA 136  K 3.5  CL 101  CO2 24  GLUCOSE 95  BUN 9  CREATININE 0.58  CALCIUM 9.4  MG --  PHOS --   Liver Function Tests: No results found for this basename: AST:2,ALT:2,ALKPHOS:2,BILITOT:2,PROT:2,ALBUMIN:2 in the last 72 hours No results found for this basename: LIPASE:2,AMYLASE:2 in the last 72 hours No results found for this basename: AMMONIA:2 in the last 72 hours CBC:  Basename 10/28/10 0444  WBC 7.6  NEUTROABS 5.3  HGB 14.2  HCT 42.2  MCV 93.8  PLT 109*   Cardiac Enzymes: No results found for this basename: CKTOTAL:3,CKMB:3,CKMBINDEX:3,TROPONINI:3 in the last 72 hours BNP: No results found for this basename: POCBNP:3 in the last 72 hours D-Dimer: No  results found for this basename: DDIMER:2 in the last 72 hours CBG: No results found for this basename: GLUCAP:6 in the last 72 hours Hemoglobin A1C: No results found for this basename: HGBA1C in the last 72 hours Fasting Lipid Panel: No results found for this basename: CHOL,HDL,LDLCALC,TRIG,CHOLHDL,LDLDIRECT in the last 72 hours Thyroid Function Tests: No results found for this basename: TSH,T4TOTAL,FREET4,T3FREE,THYROIDAB in the last 72 hours Anemia Panel: No results found for this basename: VITAMINB12,FOLATE,FERRITIN,TIBC,IRON,RETICCTPCT in the last 72 hours Urine Drug Screen:  Alcohol Level: No results found for this basename: ETH:2 in the last 72 hours Urinalysis:  Misc. Labs:  ABGS No results found for this basename: PHART,PCO2,PO2ART,TCO2,HCO3 in the last 72 hours CULTURES Recent Results (from the past 240 hour(s))  MRSA PCR SCREENING     Status: Normal   Collection Time   10/27/10  2:04 PM      Component Value Range Status Comment   MRSA by PCR NEGATIVE  NEGATIVE  Final    Studies/Results: No results found.  Medications:  Scheduled:   . buprenorphine-naloxone  1 tablet Sublingual TID  . cloNIDine  0.3 mg Transdermal Weekly  . cloNIDine  0.1 mg Oral TID  . DULoxetine  60 mg Oral Daily  . enoxaparin  40 mg Subcutaneous Daily  . folic acid  1 mg Oral Daily  . gabapentin  600 mg Oral TID  . lithium carbonate  300 mg Oral Q12H  . loratadine  10 mg Oral Daily  . multivitamins ther. w/minerals  1 tablet Oral  Daily  . nicotine  21 mg Transdermal Daily  . pantoprazole  40 mg Oral Q1200  . phenytoin (DILANTIN) IV  1,000 mg Intravenous Once  . phenytoin  300 mg Oral QHS  . sodium chloride      . sodium chloride      . sodium chloride      . sodium chloride      . sodium chloride      . vitamin B-1  100 mg Oral Daily   Or  . thiamine  100 mg Intravenous Daily   Continuous:   . dexmedetomidine (PRECEDEX) IV infusion for high rates Stopped (10/28/10 1136)  .  dextrose 5 % and 0.45% NaCl 1,000 mL (10/26/10 0204)   NWG:[id number], haloperidol lactate, LORazepam, LORazepam  Assesment: He has alcohol withdrawal syndrome. He also has posttraumatic stress disorder and what is probably a version of bipolar disease he is clearly better Active Problems:  * No active hospital problems. *     Plan: He will need to stay here and he'll the 22nd at which point he can be admitted to a alcohol treatment center I believe    LOS: 6 days   Martin Smeal L 10/30/2010, 8:08 AM

## 2010-10-31 NOTE — Progress Notes (Signed)
Subjective: Is much better. He's much less shaky. He has no new complaints  Objective: Vital signs in last 24 hours: Temp:  [98.3 F (36.8 C)-98.8 F (37.1 C)] 98.3 F (36.8 C) (10/21 0400) Pulse Rate:  [79] 79  (10/21 0137) Resp:  [16-28] 16  (10/21 0400) BP: (110-140)/(65-103) 133/89 mmHg (10/21 0700) SpO2:  [100 %] 100 % (10/21 0400) Weight:  [83.5 kg (184 lb 1.4 oz)] 184 lb 1.4 oz (83.5 kg) (10/21 0400) Weight change: 0.4 kg (14.1 oz) Last BM Date: 10/30/10  Intake/Output from previous day: 10/20 0701 - 10/21 0700 In: 950 [P.O.:950] Out: -   PHYSICAL EXAM General appearance: alert, cooperative and no distress Resp: clear to auscultation bilaterally Cardio: regular rate and rhythm, S1, S2 normal, no murmur, click, rub or gallop GI: soft, non-tender; bowel sounds normal; no masses,  no organomegaly Extremities: extremities normal, atraumatic, no cyanosis or edema  Lab Results:    Basic Metabolic Panel: No results found for this basename: NA:2,K:2,CL:2,CO2:2,GLUCOSE:2,BUN:2,CREATININE:2,CALCIUM:2,MG:2,PHOS:2 in the last 72 hours Liver Function Tests: No results found for this basename: AST:2,ALT:2,ALKPHOS:2,BILITOT:2,PROT:2,ALBUMIN:2 in the last 72 hours No results found for this basename: LIPASE:2,AMYLASE:2 in the last 72 hours No results found for this basename: AMMONIA:2 in the last 72 hours CBC: No results found for this basename: WBC:2,NEUTROABS:2,HGB:2,HCT:2,MCV:2,PLT:2 in the last 72 hours Cardiac Enzymes: No results found for this basename: CKTOTAL:3,CKMB:3,CKMBINDEX:3,TROPONINI:3 in the last 72 hours BNP: No results found for this basename: POCBNP:3 in the last 72 hours D-Dimer: No results found for this basename: DDIMER:2 in the last 72 hours CBG: No results found for this basename: GLUCAP:6 in the last 72 hours Hemoglobin A1C: No results found for this basename: HGBA1C in the last 72 hours Fasting Lipid Panel: No results found for this basename:  CHOL,HDL,LDLCALC,TRIG,CHOLHDL,LDLDIRECT in the last 72 hours Thyroid Function Tests: No results found for this basename: TSH,T4TOTAL,FREET4,T3FREE,THYROIDAB in the last 72 hours Anemia Panel: No results found for this basename: VITAMINB12,FOLATE,FERRITIN,TIBC,IRON,RETICCTPCT in the last 72 hours Urine Drug Screen:  Alcohol Level: No results found for this basename: ETH:2 in the last 72 hours Urinalysis:  Misc. Labs:  ABGS No results found for this basename: PHART,PCO2,PO2ART,TCO2,HCO3 in the last 72 hours CULTURES Recent Results (from the past 240 hour(s))  MRSA PCR SCREENING     Status: Normal   Collection Time   10/27/10  2:04 PM      Component Value Range Status Comment   MRSA by PCR NEGATIVE  NEGATIVE  Final    Studies/Results: No results found.  Medications:  Scheduled:   . buprenorphine-naloxone  1 tablet Sublingual TID  . cloNIDine  0.3 mg Transdermal Weekly  . cloNIDine  0.1 mg Oral TID  . DULoxetine  60 mg Oral Daily  . enoxaparin  40 mg Subcutaneous Daily  . folic acid  1 mg Oral Daily  . gabapentin  600 mg Oral TID  . lithium carbonate  300 mg Oral Q12H  . loratadine  10 mg Oral Daily  . multivitamins ther. w/minerals  1 tablet Oral Daily  . nicotine  21 mg Transdermal Daily  . pantoprazole  40 mg Oral Q1200  . phenytoin (DILANTIN) IV  1,000 mg Intravenous Once  . phenytoin  300 mg Oral QHS  . vitamin B-1  100 mg Oral Daily   Or  . thiamine  100 mg Intravenous Daily   Continuous:   . dexmedetomidine (PRECEDEX) IV infusion for high rates Stopped (10/28/10 1136)  . dextrose 5 % and 0.45% NaCl 1,000 mL (  10/26/10 0204)   UEA:[id number], haloperidol lactate, LORazepam, LORazepam  Assesment: He has alcohol withdrawal syndrome delirium tremens he also has bipolar and posttraumatic stress disorder but he is much better. Active Problems:  Delirium tremens  Post traumatic stress disorder (PTSD)  Bipolar 1 disorder    Plan: Since she's on oral  medications now I think he can move out of the intensive care unit. The plan for disposition is for him to be transferred to an alcohol rehabilitation center tomorrow    LOS: 7 days   Dean [name] 10/31/2010, 9:26 AM

## 2010-11-01 MED ORDER — GABAPENTIN 300 MG PO CAPS: 600.0000 mg | ORAL_CAPSULE | Freq: Three times a day (TID) | ORAL | Status: DC

## 2010-11-01 MED ORDER — PANTOPRAZOLE SODIUM 40 MG PO TBEC: 40.0000 mg | DELAYED_RELEASE_TABLET | Freq: Every day | ORAL | Status: DC

## 2010-11-01 MED ORDER — THERA M PLUS PO TABS: 1.0000 | ORAL_TABLET | Freq: Every day | ORAL | Status: DC

## 2010-11-01 MED ORDER — CLONAZEPAM 1 MG PO TABS: 2.0000 mg | ORAL_TABLET | Freq: Two times a day (BID) | ORAL | Status: DC | PRN

## 2010-11-01 MED ORDER — NICOTINE 21 MG/24HR TD PT24: 1.0000 | MEDICATED_PATCH | Freq: Every day | TRANSDERMAL | Status: AC

## 2010-11-01 MED ORDER — PHENYTOIN SODIUM EXTENDED 300 MG PO CAPS: 300.0000 mg | ORAL_CAPSULE | Freq: Every day | ORAL | Status: DC

## 2010-11-01 MED ORDER — LITHIUM CARBONATE ER 300 MG PO TBCR: 300.0000 mg | EXTENDED_RELEASE_TABLET | Freq: Two times a day (BID) | ORAL | Status: AC

## 2010-11-01 MED ORDER — CLONIDINE HCL 0.1 MG PO TABS: 0.1000 mg | ORAL_TABLET | Freq: Three times a day (TID) | ORAL | Status: DC

## 2010-11-01 MED ORDER — DULOXETINE HCL 60 MG PO CPEP: 60.0000 mg | ORAL_CAPSULE | Freq: Every day | ORAL | Status: DC

## 2010-11-01 NOTE — Progress Notes (Signed)
Subjective: He is much improved. He is still somewhat shaky but I think she's back about to baseline. He is anxious to go start his rehabilitation  Objective: Vital signs in last 24 hours: Temp:  [97.9 F (36.6 C)-98.6 F (37 C)] 98.1 F (36.7 C) (10/22 0404) Pulse Rate:  [79] 79  (10/22 0130) Resp:  [12-22] 22  (10/22 0600) BP: (115-142)/(77-101) 115/77 mmHg (10/22 0405) SpO2:  [100 %] 100 % (10/21 1700) Weight:  [84.1 kg (185 lb 6.5 oz)] 185 lb 6.5 oz (84.1 kg) (10/22 0404) Weight change: 0.6 kg (1 lb 5.2 oz) Last BM Date: 10/30/10  Intake/Output from previous day: 10/21 0701 - 10/22 0700 In: 1260 [P.O.:1260] Out: -   PHYSICAL EXAM General appearance: alert and no distress Resp: clear to auscultation bilaterally Cardio: regular rate and rhythm, S1, S2 normal, no murmur, click, rub or gallop GI: soft, non-tender; bowel sounds normal; no masses,  no organomegaly Extremities: extremities normal, atraumatic, no cyanosis or edema  Lab Results:    Basic Metabolic Panel: No results found for this basename: NA:2,K:2,CL:2,CO2:2,GLUCOSE:2,BUN:2,CREATININE:2,CALCIUM:2,MG:2,PHOS:2 in the last 72 hours Liver Function Tests: No results found for this basename: AST:2,ALT:2,ALKPHOS:2,BILITOT:2,PROT:2,ALBUMIN:2 in the last 72 hours No results found for this basename: LIPASE:2,AMYLASE:2 in the last 72 hours No results found for this basename: AMMONIA:2 in the last 72 hours CBC: No results found for this basename: WBC:2,NEUTROABS:2,HGB:2,HCT:2,MCV:2,PLT:2 in the last 72 hours Cardiac Enzymes: No results found for this basename: CKTOTAL:3,CKMB:3,CKMBINDEX:3,TROPONINI:3 in the last 72 hours BNP: No results found for this basename: POCBNP:3 in the last 72 hours D-Dimer: No results found for this basename: DDIMER:2 in the last 72 hours CBG: No results found for this basename: GLUCAP:6 in the last 72 hours Hemoglobin A1C: No results found for this basename: HGBA1C in the last 72  hours Fasting Lipid Panel: No results found for this basename: CHOL,HDL,LDLCALC,TRIG,CHOLHDL,LDLDIRECT in the last 72 hours Thyroid Function Tests: No results found for this basename: TSH,T4TOTAL,FREET4,T3FREE,THYROIDAB in the last 72 hours Anemia Panel: No results found for this basename: VITAMINB12,FOLATE,FERRITIN,TIBC,IRON,RETICCTPCT in the last 72 hours Urine Drug Screen:  Alcohol Level: No results found for this basename: ETH:2 in the last 72 hours Urinalysis:  Misc. Labs:  ABGS No results found for this basename: PHART,PCO2,PO2ART,TCO2,HCO3 in the last 72 hours CULTURES Recent Results (from the past 240 hour(s))  MRSA PCR SCREENING     Status: Normal   Collection Time   10/27/10  2:04 PM      Component Value Range Status Comment   MRSA by PCR NEGATIVE  NEGATIVE  Final    Studies/Results: No results found.  Medications:  Prior to Admission:  Prescriptions prior to admission  Medication Sig Dispense Refill  . Buprenorphine HCl-Naloxone HCl (SUBOXONE) 8-2 MG FILM Place 1 Film under the tongue 3 (three) times daily.        . clonazePAM (KLONOPIN) 2 MG tablet Take 2 mg by mouth 2 (two) times daily as needed. Anxiety      . DULoxetine (CYMBALTA) 60 MG capsule Take 60 mg by mouth daily.        Marland Kitchen esomeprazole (NEXIUM) 40 MG capsule Take 40 mg by mouth daily before breakfast.        . gabapentin (NEURONTIN) 600 MG tablet Take 600 mg by mouth 3 (three) times daily.        Marland Kitchen gabapentin (NEURONTIN) 600 MG tablet Take 600 mg by mouth 3 (three) times daily.        Marland Kitchen ibuprofen (ADVIL,MOTRIN) 800 MG tablet  Take 800 mg by mouth every 8 (eight) hours as needed.       . phenytoin (DILANTIN) 300 MG ER capsule Take 300 mg by mouth daily.        Marland Kitchen DISCONTD: cetirizine (ZYRTEC) 10 MG tablet Take 10 mg by mouth daily.        . ARTIFICIAL TEAR OP Apply 1 drop to eye daily as needed. Dry Eyes       . buprenorphine-naloxone (SUBOXONE) 2-0.5 MG SUBL Place 1 tablet under the tongue 3 (three) times  daily.       . cetirizine (ZYRTEC) 10 MG chewable tablet Chew 10 mg by mouth daily.        . fluticasone (FLONASE) 50 MCG/ACT nasal spray Place 2 sprays into the nose daily as needed. allergies      . gabapentin (NEURONTIN) 300 MG capsule Take 600 mg by mouth 3 (three) times daily.        Marland Kitchen ibuprofen (ADVIL,MOTRIN) 200 MG tablet Take 800 mg by mouth every 6 (six) hours as needed. Pain       Scheduled:   . buprenorphine-naloxone  1 tablet Sublingual TID  . cloNIDine  0.3 mg Transdermal Weekly  . cloNIDine  0.1 mg Oral TID  . DULoxetine  60 mg Oral Daily  . enoxaparin  40 mg Subcutaneous Daily  . folic acid  1 mg Oral Daily  . gabapentin  600 mg Oral TID  . lithium carbonate  300 mg Oral Q12H  . loratadine  10 mg Oral Daily  . multivitamins ther. w/minerals  1 tablet Oral Daily  . nicotine  21 mg Transdermal Daily  . pantoprazole  40 mg Oral Q1200  . phenytoin (DILANTIN) IV  1,000 mg Intravenous Once  . phenytoin  300 mg Oral QHS  . vitamin B-1  100 mg Oral Daily   Or  . thiamine  100 mg Intravenous Daily   Continuous:   . dexmedetomidine (PRECEDEX) IV infusion for high rates Stopped (10/28/10 1136)  . dextrose 5 % and 0.45% NaCl 1,000 mL (10/26/10 0204)   ZOX:[id number], haloperidol lactate, LORazepam, LORazepam  Assesment: He has alcohol withdrawal and DTs from that. He also has bipolar disease and post traumatic stress disorder. He has had seizures. Active Problems:  Delirium tremens  Post traumatic stress disorder (PTSD)  Bipolar 1 disorder    Plan: He is ready for transfer to the rehabilitation Center.    LOS: 8 days   Tymel Conely L 11/01/2010, 8:10 AM

## 2010-11-01 NOTE — Consult Note (Signed)
Pt is medically stable for D/C but an inpatient bed for substance abuse is not available.  [facility] in [place] must be called each morning re: bed availability.  [facility] in [place] has a waiting list of 50 people.  [facility] reports they may have a bed by week's end.  Explained this to Pt who understands he is currently waiting for bed availability.  He expressed a desire to D/C but agrees that he shouldn't D/C home if this can be avoided.  CSW contacted Dr. Juanetta Gosling re: delay of D/C.  CSW will continue to follow and assist with D/C as needed.

## 2010-11-01 NOTE — Plan of Care (Signed)
Problem: Phase III Progression Outcomes Goal: Activity at appropriate level-compared to baseline (UP IN CHAIR FOR HEMODIALYSIS)  Outcome: Completed/Met Date Met:  11/01/10 Walking around unit and in room. Back to baseline with activity level

## 2010-11-01 NOTE — Progress Notes (Signed)
Paged Dr. Juanetta Gosling; [facility] unable to accept pt today. Orders received to cancel discharge for today.

## 2010-11-01 NOTE — Discharge Summary (Signed)
Physician Discharge Summary  Patient ID: Dean [name] MRN: [id number] DOB/AGE: 1980/01/21 30 y.o. Primary Care Physician:Ollivander See L, MD Admit date: 10/24/2010 Discharge date: 11/01/2010    Discharge Diagnosis: DTs Alcohol withdrawal syndrome Seizure disorder Posttraumatic stress disorder Bipolar type I Hypertension   Current Discharge Medication List    START taking these medications   Details  !! clonazePAM (KLONOPIN) 1 MG tablet Take 2 tablets (2 mg total) by mouth 2 (two) times daily as needed for anxiety. Qty: 40 tablet, Refills: 0    cloNIDine (CATAPRES) 0.1 MG tablet Take 1 tablet (0.1 mg total) by mouth 3 (three) times daily. Qty: 60 tablet, Refills: 0    !! DULoxetine (CYMBALTA) 60 MG capsule Take 1 capsule (60 mg total) by mouth daily. Qty: 14 capsule, Refills: 0    !! gabapentin (NEURONTIN) 300 MG capsule Take 2 capsules (600 mg total) by mouth 3 (three) times daily. Qty: 60 capsule, Refills: 0    lithium carbonate (LITHOBID) 300 MG CR tablet Take 1 tablet (300 mg total) by mouth 2 (two) times daily. Qty: 40 tablet, Refills: 0    Multiple Vitamins-Minerals (MULTIVITAMINS THER. W/MINERALS) TABS Take 1 tablet by mouth daily. Qty: 30 each, Refills: 0    nicotine (NICODERM CQ - DOSED IN MG/24 HOURS) 21 mg/24hr patch Place 1 patch (21 patches total) onto the skin daily. Qty: 28 patch, Refills: 0    pantoprazole (PROTONIX) 40 MG tablet Take 1 tablet (40 mg total) by mouth daily at 12 noon. Qty: 20 tablet, Refills: 0    !! phenytoin (DILANTIN) 300 MG ER capsule Take 1 capsule (300 mg total) by mouth at bedtime. Qty: 1 capsule, Refills: 20     !! - Potential duplicate medications found. Please discuss with provider.    CONTINUE these medications which have NOT CHANGED   Details  Buprenorphine HCl-Naloxone HCl (SUBOXONE) 8-2 MG FILM Place 1 Film under the tongue 3 (three) times daily.      !! clonazePAM (KLONOPIN) 2 MG tablet Take 2 mg by mouth 2  (two) times daily as needed. Anxiety    !! DULoxetine (CYMBALTA) 60 MG capsule Take 60 mg by mouth daily.      esomeprazole (NEXIUM) 40 MG capsule Take 40 mg by mouth daily before breakfast.      !! gabapentin (NEURONTIN) 600 MG tablet Take 600 mg by mouth 3 (three) times daily.      !! gabapentin (NEURONTIN) 600 MG tablet Take 600 mg by mouth 3 (three) times daily.      !! ibuprofen (ADVIL,MOTRIN) 800 MG tablet Take 800 mg by mouth every 8 (eight) hours as needed.     !! phenytoin (DILANTIN) 300 MG ER capsule Take 300 mg by mouth daily.      ARTIFICIAL TEAR OP Apply 1 drop to eye daily as needed. Dry Eyes     buprenorphine-naloxone (SUBOXONE) 2-0.5 MG SUBL Place 1 tablet under the tongue 3 (three) times daily.     cetirizine (ZYRTEC) 10 MG chewable tablet Chew 10 mg by mouth daily.      fluticasone (FLONASE) 50 MCG/ACT nasal spray Place 2 sprays into the nose daily as needed. allergies    !! gabapentin (NEURONTIN) 300 MG capsule Take 600 mg by mouth 3 (three) times daily.      !! ibuprofen (ADVIL,MOTRIN) 200 MG tablet Take 800 mg by mouth every 6 (six) hours as needed. Pain     !! - Potential duplicate medications found. Please discuss with provider.  STOP taking these medications     cetirizine (ZYRTEC) 10 MG tablet         Discharged Condition: Improved    Consults:ACT team  Significant Diagnostic Studies: No results found.  Lab Results: No results found for this or any previous visit (from the past 48 hour(s)). Recent Results (from the past 240 hour(s))  MRSA PCR SCREENING     Status: Normal   Collection Time   10/27/10  2:04 PM      Component Value Range Status Comment   MRSA by PCR NEGATIVE  NEGATIVE  Final      Hospital Course: He was admitted with alcohol withdrawal syndrome and his desire to seek rehabilitation for his chronic alcoholism. He initially did fairly well then became very confused agitated and was felt to have DTs as well as complications  of his other problems. He was treated with Precedex treated with Ativan and improved. This took several days. He is now ready to be transferred to an alcohol rehabilitation facility and that is being arranged  Discharge Exam: Blood pressure 115/77, pulse 79, temperature 98.1 F (36.7 C), temperature source Oral, resp. rate 22, height 5\' 6"  (1.676 m), weight 84.1 kg (185 lb 6.5 oz), SpO2 100.00%. He is awake and alert minimally tremulous in no distress oriented and eager to begin his rehabilitation  Disposition: Discharge and he will be admitted to an alcohol rehabilitation Center      Signed: Nalaysia Manganiello L 11/01/2010, 8:12 AM

## 2010-11-02 MED ORDER — SODIUM CHLORIDE 0.9 % IJ SOLN
INTRAMUSCULAR | Status: AC
  Filled 2010-11-02: qty 10

## 2010-11-02 NOTE — Progress Notes (Signed)
As per Roe Coombs: Child psychotherapist with [facility], I called [facility] ([facility]habilitation [facility] in [address]) to check availability of beds. As per [facility] Admissions, there are no beds available today.  They state patient can call tomorrow 11/03/2010 to check availability. Dr. Juanetta Gosling and patient are both aware.

## 2010-11-02 NOTE — Consult Note (Signed)
Pt is expressing concern re: D/Cing without his Suboxone.  This has been prescribed by Dr. Carver Fila at [facility], but Pt was told he could not get anymore until he completed treatment.  CSW contacted [facility] to request that Pt be prescribed a small amount of Suboxone, to be held and dispensed by Pt's mother, until an inpatient bed can be found.  CSW left VM and is awaiting callback.  Pt and his RN is aware of this.  Pt stated he would follow up with [facility] as well.

## 2010-11-02 NOTE — Progress Notes (Signed)
Subjective: He was not able to go to the alcohol rehabilitation Center yesterday. I have discussed all this with him and we will see if he can go today. If he cannot I may let him go home but we do have a problem with the fact that he may not be able to get his Suboxone .  Objective: Vital signs in last 24 hours: Temp:  [97.8 F (36.6 C)-98.1 F (36.7 C)] 97.8 F (36.6 C) (10/23 0601) Pulse Rate:  [47-52] 47  (10/23 0601) Resp:  [18] 18  (10/23 0601) BP: (117-134)/(68-89) 118/79 mmHg (10/23 0601) SpO2:  [98 %-100 %] 98 % (10/23 0601) Weight:  [85 kg (187 lb 6.3 oz)] 187 lb 6.3 oz (85 kg) (10/23 0500) Weight change: 0.9 kg (1 lb 15.8 oz) Last BM Date: 10/31/10  Intake/Output from previous day: 10/22 0701 - 10/23 0700 In: 820 [P.O.:820] Out: -   PHYSICAL EXAM General appearance: alert, cooperative and Minimally tremulous Resp: clear to auscultation bilaterally Cardio: regular rate and rhythm, S1, S2 normal, no murmur, click, rub or gallop GI: soft, non-tender; bowel sounds normal; no masses,  no organomegaly Extremities: extremities normal, atraumatic, no cyanosis or edema  Lab Results:    Basic Metabolic Panel: No results found for this basename: NA:2,K:2,CL:2,CO2:2,GLUCOSE:2,BUN:2,CREATININE:2,CALCIUM:2,MG:2,PHOS:2 in the last 72 hours Liver Function Tests: No results found for this basename: AST:2,ALT:2,ALKPHOS:2,BILITOT:2,PROT:2,ALBUMIN:2 in the last 72 hours No results found for this basename: LIPASE:2,AMYLASE:2 in the last 72 hours No results found for this basename: AMMONIA:2 in the last 72 hours CBC: No results found for this basename: WBC:2,NEUTROABS:2,HGB:2,HCT:2,MCV:2,PLT:2 in the last 72 hours Cardiac Enzymes: No results found for this basename: CKTOTAL:3,CKMB:3,CKMBINDEX:3,TROPONINI:3 in the last 72 hours BNP: No results found for this basename: POCBNP:3 in the last 72 hours D-Dimer: No results found for this basename: DDIMER:2 in the last 72 hours CBG: No  results found for this basename: GLUCAP:6 in the last 72 hours Hemoglobin A1C: No results found for this basename: HGBA1C in the last 72 hours Fasting Lipid Panel: No results found for this basename: CHOL,HDL,LDLCALC,TRIG,CHOLHDL,LDLDIRECT in the last 72 hours Thyroid Function Tests: No results found for this basename: TSH,T4TOTAL,FREET4,T3FREE,THYROIDAB in the last 72 hours Anemia Panel: No results found for this basename: VITAMINB12,FOLATE,FERRITIN,TIBC,IRON,RETICCTPCT in the last 72 hours Urine Drug Screen:  Alcohol Level: No results found for this basename: ETH:2 in the last 72 hours Urinalysis:  Misc. Labs:  ABGS No results found for this basename: PHART,PCO2,PO2ART,TCO2,HCO3 in the last 72 hours CULTURES Recent Results (from the past 240 hour(s))  MRSA PCR SCREENING     Status: Normal   Collection Time   10/27/10  2:04 PM      Component Value Range Status Comment   MRSA by PCR NEGATIVE  NEGATIVE  Final    Studies/Results: No results found.  Medications:  Scheduled:   . buprenorphine-naloxone  1 tablet Sublingual TID  . cloNIDine  0.3 mg Transdermal Weekly  . cloNIDine  0.1 mg Oral TID  . DULoxetine  60 mg Oral Daily  . enoxaparin  40 mg Subcutaneous Daily  . folic acid  1 mg Oral Daily  . gabapentin  600 mg Oral TID  . lithium carbonate  300 mg Oral Q12H  . loratadine  10 mg Oral Daily  . multivitamins ther. w/minerals  1 tablet Oral Daily  . nicotine  21 mg Transdermal Daily  . pantoprazole  40 mg Oral Q1200  . phenytoin (DILANTIN) IV  1,000 mg Intravenous Once  . phenytoin  300 mg  Oral QHS  . sodium chloride      . vitamin B-1  100 mg Oral Daily   Or  . thiamine  100 mg Intravenous Daily   Continuous:   . dexmedetomidine (PRECEDEX) IV infusion for high rates Stopped (10/28/10 1136)  . dextrose 5 % and 0.45% NaCl 1,000 mL (10/26/10 0204)   ZOX:[id number], haloperidol lactate, LORazepam, LORazepam  Assesment: He has DTs. He is much improved and I  think he is no longer having DTs. He has significant problems with drugs and alcohol and he is willing to go to rehabilitation. He also has bipolar disorder and posttraumatic stress disorder. Active Problems:  Delirium tremens  Post traumatic stress disorder (PTSD)  Bipolar 1 disorder    Plan: I'm going to see what can be arranged as far as the rehabilitation center today and then try to decide what we need to do from there    LOS: 9 days   Airyanna Dipalma L 11/02/2010, 7:47 AM

## 2010-11-03 MED ORDER — LORAZEPAM 1 MG PO TABS
ORAL_TABLET | ORAL | Status: AC
  Filled 2010-11-03: qty 2

## 2010-11-03 NOTE — Progress Notes (Signed)
Subjective: He is overall about the same. We are still working out his discharge plan and they're still problem with his medications.  Objective: Vital signs in last 24 hours: Temp:  [97.1 F (36.2 C)-98.4 F (36.9 C)] 97.1 F (36.2 C) (10/24 0653) Pulse Rate:  [50-61] 61  (10/24 0653) Resp:  [20-22] 20  (10/24 0653) BP: (97-132)/(65-85) 97/65 mmHg (10/24 0653) SpO2:  [96 %-98 %] 98 % (10/24 0653) Weight change:  Last BM Date: 11/02/10  Intake/Output from previous day: 10/23 0701 - 10/24 0700 In: 1440 [P.O.:1440] Out: -   PHYSICAL EXAM General appearance: alert and no distress Resp: clear to auscultation bilaterally Cardio: regular rate and rhythm, S1, S2 normal, no murmur, click, rub or gallop GI: soft, non-tender; bowel sounds normal; no masses,  no organomegaly Extremities: extremities normal, atraumatic, no cyanosis or edema  Lab Results:    Basic Metabolic Panel: No results found for this basename: NA:2,K:2,CL:2,CO2:2,GLUCOSE:2,BUN:2,CREATININE:2,CALCIUM:2,MG:2,PHOS:2 in the last 72 hours Liver Function Tests: No results found for this basename: AST:2,ALT:2,ALKPHOS:2,BILITOT:2,PROT:2,ALBUMIN:2 in the last 72 hours No results found for this basename: LIPASE:2,AMYLASE:2 in the last 72 hours No results found for this basename: AMMONIA:2 in the last 72 hours CBC: No results found for this basename: WBC:2,NEUTROABS:2,HGB:2,HCT:2,MCV:2,PLT:2 in the last 72 hours Cardiac Enzymes: No results found for this basename: CKTOTAL:3,CKMB:3,CKMBINDEX:3,TROPONINI:3 in the last 72 hours BNP: No results found for this basename: POCBNP:3 in the last 72 hours D-Dimer: No results found for this basename: DDIMER:2 in the last 72 hours CBG: No results found for this basename: GLUCAP:6 in the last 72 hours Hemoglobin A1C: No results found for this basename: HGBA1C in the last 72 hours Fasting Lipid Panel: No results found for this basename: CHOL,HDL,LDLCALC,TRIG,CHOLHDL,LDLDIRECT in  the last 72 hours Thyroid Function Tests: No results found for this basename: TSH,T4TOTAL,FREET4,T3FREE,THYROIDAB in the last 72 hours Anemia Panel: No results found for this basename: VITAMINB12,FOLATE,FERRITIN,TIBC,IRON,RETICCTPCT in the last 72 hours Urine Drug Screen:  Alcohol Level: No results found for this basename: ETH:2 in the last 72 hours Urinalysis:  Misc. Labs:  ABGS No results found for this basename: PHART,PCO2,PO2ART,TCO2,HCO3 in the last 72 hours CULTURES Recent Results (from the past 240 hour(s))  MRSA PCR SCREENING     Status: Normal   Collection Time   10/27/10  2:04 PM      Component Value Range Status Comment   MRSA by PCR NEGATIVE  NEGATIVE  Final    Studies/Results: No results found.  Medications:  Scheduled:   . buprenorphine-naloxone  1 tablet Sublingual TID  . cloNIDine  0.3 mg Transdermal Weekly  . cloNIDine  0.1 mg Oral TID  . DULoxetine  60 mg Oral Daily  . enoxaparin  40 mg Subcutaneous Daily  . folic acid  1 mg Oral Daily  . gabapentin  600 mg Oral TID  . lithium carbonate  300 mg Oral Q12H  . loratadine  10 mg Oral Daily  . multivitamins ther. w/minerals  1 tablet Oral Daily  . nicotine  21 mg Transdermal Daily  . pantoprazole  40 mg Oral Q1200  . phenytoin (DILANTIN) IV  1,000 mg Intravenous Once  . phenytoin  300 mg Oral QHS  . sodium chloride      . vitamin B-1  100 mg Oral Daily   Or  . thiamine  100 mg Intravenous Daily   Continuous:   . dexmedetomidine (PRECEDEX) IV infusion for high rates Stopped (10/28/10 1136)  . 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 He also has postherpetic stress disorder and bipolar disorder. He is hypertensive and as well controlled. Active Problems:  Delirium tremens  Post traumatic stress disorder (PTSD)  Bipolar 1 disorder    Plan:  I hope he can be transferred to an alcohol rehabilitation center today warned we'll get his medication for him so that he can safely be at home. I also called [facility] to see if he could get a small amount of Suboxone but I have not heard back    LOS: 10 days   Prudie Guthridge L 11/03/2010, 7:29 AM

## 2010-11-04 NOTE — Progress Notes (Signed)
Patient states that he is feeling extremely agitated and that his ativan doesn't seem to be helping. Haldol given in addition

## 2010-11-04 NOTE — Progress Notes (Signed)
CSW contacted [facility] this morning and no beds available.  Message left for [facility] and referral faxed.  CSW received call from Dr. Carver Fila at [facility] who reports pt was discharged from their practice two months ago due to noncompliance and they are not willing to write prescription for Suboxone.  MD notified.  CSW also contacted [facility] and Dr. Jae Dire office in [place].  Messages left for both in order to set up outpatient appointment for Suboxone prescription.  Tommy with [facility] team reports he spoke with [facility] and they feel we have tried everything we could in order to assist pt with treatment options.   Karn Cassis

## 2010-11-04 NOTE — Progress Notes (Signed)
Subjective: He is overall about the same. He has no new complaints. We have had multiple attempts to get into a rehabilitation center without success so far. There has been discussion about discharging him home but I cannot write his prescription for Suboxone. I discussed this with him at length and he feels he will have significant difficulty dealing with his overall situation without that medication. I think that's probably correct.  Objective: Vital signs in last 24 hours: Temp:  [97.6 F (36.4 C)-98 F (36.7 C)] 98 F (36.7 C) (10/25 0400) BP: (118)/(65) 118/65 mmHg (10/25 0000) SpO2:  [99 %] 99 % (10/25 0000) Weight change:  Last BM Date: 11/02/10  Intake/Output from previous day: 10/24 0701 - 10/25 0700 In: 1680 [P.O.:1680] Out: -   PHYSICAL EXAM General appearance: alert, cooperative and no distress Resp: clear to auscultation bilaterally Cardio: regular rate and rhythm, S1, S2 normal, no murmur, click, rub or gallop GI: soft, non-tender; bowel sounds normal; no masses,  no organomegaly Extremities: extremities normal, atraumatic, no cyanosis or edema  Lab Results:    Basic Metabolic Panel: No results found for this basename: NA:2,K:2,CL:2,CO2:2,GLUCOSE:2,BUN:2,CREATININE:2,CALCIUM:2,MG:2,PHOS:2 in the last 72 hours Liver Function Tests: No results found for this basename: AST:2,ALT:2,ALKPHOS:2,BILITOT:2,PROT:2,ALBUMIN:2 in the last 72 hours No results found for this basename: LIPASE:2,AMYLASE:2 in the last 72 hours No results found for this basename: AMMONIA:2 in the last 72 hours CBC: No results found for this basename: WBC:2,NEUTROABS:2,HGB:2,HCT:2,MCV:2,PLT:2 in the last 72 hours Cardiac Enzymes: No results found for this basename: CKTOTAL:3,CKMB:3,CKMBINDEX:3,TROPONINI:3 in the last 72 hours BNP: No results found for this basename: POCBNP:3 in the last 72 hours D-Dimer: No results found for this basename: DDIMER:2 in the last 72 hours CBG: No results found for  this basename: GLUCAP:6 in the last 72 hours Hemoglobin A1C: No results found for this basename: HGBA1C in the last 72 hours Fasting Lipid Panel: No results found for this basename: CHOL,HDL,LDLCALC,TRIG,CHOLHDL,LDLDIRECT in the last 72 hours Thyroid Function Tests: No results found for this basename: TSH,T4TOTAL,FREET4,T3FREE,THYROIDAB in the last 72 hours Anemia Panel: No results found for this basename: VITAMINB12,FOLATE,FERRITIN,TIBC,IRON,RETICCTPCT in the last 72 hours Urine Drug Screen:  Alcohol Level: No results found for this basename: ETH:2 in the last 72 hours Urinalysis:  Misc. Labs:  ABGS No results found for this basename: PHART,PCO2,PO2ART,TCO2,HCO3 in the last 72 hours CULTURES Recent Results (from the past 240 hour(s))  MRSA PCR SCREENING     Status: Normal   Collection Time   10/27/10  2:04 PM      Component Value Range Status Comment   MRSA by PCR NEGATIVE  NEGATIVE  Final    Studies/Results: No results found.  Medications:  Prior to Admission:  Prescriptions prior to admission  Medication Sig Dispense Refill  . Buprenorphine HCl-Naloxone HCl (SUBOXONE) 8-2 MG FILM Place 1 Film under the tongue 3 (three) times daily.        . clonazePAM (KLONOPIN) 2 MG tablet Take 2 mg by mouth 2 (two) times daily as needed. Anxiety      . DULoxetine (CYMBALTA) 60 MG capsule Take 60 mg by mouth daily.        Marland Kitchen esomeprazole (NEXIUM) 40 MG capsule Take 40 mg by mouth daily before breakfast.        . gabapentin (NEURONTIN) 600 MG tablet Take 600 mg by mouth 3 (three) times daily.        Marland Kitchen gabapentin (NEURONTIN) 600 MG tablet Take 600 mg by mouth 3 (three) times daily.        Marland Kitchen  ibuprofen (ADVIL,MOTRIN) 800 MG tablet Take 800 mg by mouth every 8 (eight) hours as needed.       . phenytoin (DILANTIN) 300 MG ER capsule Take 300 mg by mouth daily.        Marland Kitchen DISCONTD: cetirizine (ZYRTEC) 10 MG tablet Take 10 mg by mouth daily.        . ARTIFICIAL TEAR OP Apply 1 drop to eye daily as  needed. Dry Eyes       . buprenorphine-naloxone (SUBOXONE) 2-0.5 MG SUBL Place 1 tablet under the tongue 3 (three) times daily.       . cetirizine (ZYRTEC) 10 MG chewable tablet Chew 10 mg by mouth daily.        . fluticasone (FLONASE) 50 MCG/ACT nasal spray Place 2 sprays into the nose daily as needed. allergies      . gabapentin (NEURONTIN) 300 MG capsule Take 600 mg by mouth 3 (three) times daily.        Marland Kitchen ibuprofen (ADVIL,MOTRIN) 200 MG tablet Take 800 mg by mouth every 6 (six) hours as needed. Pain       Scheduled:   . buprenorphine-naloxone  1 tablet Sublingual TID  . cloNIDine  0.3 mg Transdermal Weekly  . cloNIDine  0.1 mg Oral TID  . DULoxetine  60 mg Oral Daily  . enoxaparin  40 mg Subcutaneous Daily  . folic acid  1 mg Oral Daily  . gabapentin  600 mg Oral TID  . lithium carbonate  300 mg Oral Q12H  . loratadine  10 mg Oral Daily  . multivitamins ther. w/minerals  1 tablet Oral Daily  . nicotine  21 mg Transdermal Daily  . pantoprazole  40 mg Oral Q1200  . phenytoin (DILANTIN) IV  1,000 mg Intravenous Once  . phenytoin  300 mg Oral QHS  . vitamin B-1  100 mg Oral Daily   Or  . thiamine  100 mg Intravenous Daily   Continuous:   . dexmedetomidine (PRECEDEX) IV infusion for high rates Stopped (10/28/10 1136)  . dextrose 5 % and 0.45% NaCl 1,000 mL (10/26/10 0204)   WUJ:[id number], haloperidol lactate, LORazepam, LORazepam  Assesment: He he is better and ready for transfer to rehabilitation facility when we have a bed available Active Problems:  Delirium tremens  Post traumatic stress disorder (PTSD)  Bipolar 1 disorder    Plan: No change in treatments    LOS: 11 days   Marisel Tostenson L 11/04/2010, 7:43 AM

## 2010-11-05 NOTE — Progress Notes (Signed)
Pt discharged with instructions, prescriptions, and carenotes.  He verbalizes understanding and voices no further complaints or concerns at this time.  Pt ambulated off the floor with staff and family in stable condition.

## 2010-11-05 NOTE — Progress Notes (Signed)
CSW spoke with Pt's mother re: D/C plan.  Pt was unable to locate an inpatient bed but was provided with info for Suboxone prescribers and Vivitrol precribers in the area.  Pt's mother will assist in locating treatment provider after D/C. CSW will sign off at this time.

## 2010-11-05 NOTE — Progress Notes (Signed)
Subjective: He is overall about the same. We are still struggling with trying to find him an inpatient treatment center. He has not been able to find Suboxone in the clinic that he can afford to go to  Objective: Vital signs in last 24 hours: Temp:  [98 F (36.7 C)-98.3 F (36.8 C)] 98.2 F (36.8 C) (10/26 0506) Pulse Rate:  [59-61] 59  (10/26 0506) Resp:  [16-19] 18  (10/26 0506) BP: (92-101)/(63-67) 92/63 mmHg (10/26 0506) SpO2:  [96 %-98 %] 96 % (10/26 0506) Weight change:  Last BM Date: 11/02/10  Intake/Output from previous day: 10/25 0701 - 10/26 0700 In: 2280 [P.O.:2280] Out: -   PHYSICAL EXAM General appearance: alert, cooperative and no distress Resp: clear to auscultation bilaterally Cardio: regular rate and rhythm, S1, S2 normal, no murmur, click, rub or gallop GI: soft, non-tender; bowel sounds normal; no masses,  no organomegaly Extremities: extremities normal, atraumatic, no cyanosis or edema  Lab Results:    Basic Metabolic Panel: No results found for this basename: NA:2,K:2,CL:2,CO2:2,GLUCOSE:2,BUN:2,CREATININE:2,CALCIUM:2,MG:2,PHOS:2 in the last 72 hours Liver Function Tests: No results found for this basename: AST:2,ALT:2,ALKPHOS:2,BILITOT:2,PROT:2,ALBUMIN:2 in the last 72 hours No results found for this basename: LIPASE:2,AMYLASE:2 in the last 72 hours No results found for this basename: AMMONIA:2 in the last 72 hours CBC: No results found for this basename: WBC:2,NEUTROABS:2,HGB:2,HCT:2,MCV:2,PLT:2 in the last 72 hours Cardiac Enzymes: No results found for this basename: CKTOTAL:3,CKMB:3,CKMBINDEX:3,TROPONINI:3 in the last 72 hours BNP: No results found for this basename: POCBNP:3 in the last 72 hours D-Dimer: No results found for this basename: DDIMER:2 in the last 72 hours CBG: No results found for this basename: GLUCAP:6 in the last 72 hours Hemoglobin A1C: No results found for this basename: HGBA1C in the last 72 hours Fasting Lipid Panel: No  results found for this basename: CHOL,HDL,LDLCALC,TRIG,CHOLHDL,LDLDIRECT in the last 72 hours Thyroid Function Tests: No results found for this basename: TSH,T4TOTAL,FREET4,T3FREE,THYROIDAB in the last 72 hours Anemia Panel: No results found for this basename: VITAMINB12,FOLATE,FERRITIN,TIBC,IRON,RETICCTPCT in the last 72 hours Urine Drug Screen:  Alcohol Level: No results found for this basename: ETH:2 in the last 72 hours Urinalysis:  Misc. Labs:  ABGS No results found for this basename: PHART,PCO2,PO2ART,TCO2,HCO3 in the last 72 hours CULTURES Recent Results (from the past 240 hour(s))  MRSA PCR SCREENING     Status: Normal   Collection Time   10/27/10  2:04 PM      Component Value Range Status Comment   MRSA by PCR NEGATIVE  NEGATIVE  Final    Studies/Results: No results found.  Medications:  Prior to Admission:  Prescriptions prior to admission  Medication Sig Dispense Refill  . Buprenorphine HCl-Naloxone HCl (SUBOXONE) 8-2 MG FILM Place 1 Film under the tongue 3 (three) times daily.        . clonazePAM (KLONOPIN) 2 MG tablet Take 2 mg by mouth 2 (two) times daily as needed. Anxiety      . DULoxetine (CYMBALTA) 60 MG capsule Take 60 mg by mouth daily.        Marland Kitchen esomeprazole (NEXIUM) 40 MG capsule Take 40 mg by mouth daily before breakfast.        . gabapentin (NEURONTIN) 600 MG tablet Take 600 mg by mouth 3 (three) times daily.        Marland Kitchen gabapentin (NEURONTIN) 600 MG tablet Take 600 mg by mouth 3 (three) times daily.        Marland Kitchen ibuprofen (ADVIL,MOTRIN) 800 MG tablet Take 800 mg by mouth every 8 (  eight) hours as needed.       . phenytoin (DILANTIN) 300 MG ER capsule Take 300 mg by mouth daily.        Marland Kitchen DISCONTD: cetirizine (ZYRTEC) 10 MG tablet Take 10 mg by mouth daily.        . ARTIFICIAL TEAR OP Apply 1 drop to eye daily as needed. Dry Eyes       . buprenorphine-naloxone (SUBOXONE) 2-0.5 MG SUBL Place 1 tablet under the tongue 3 (three) times daily.       . cetirizine  (ZYRTEC) 10 MG chewable tablet Chew 10 mg by mouth daily.        . fluticasone (FLONASE) 50 MCG/ACT nasal spray Place 2 sprays into the nose daily as needed. allergies      . gabapentin (NEURONTIN) 300 MG capsule Take 600 mg by mouth 3 (three) times daily.        Marland Kitchen ibuprofen (ADVIL,MOTRIN) 200 MG tablet Take 800 mg by mouth every 6 (six) hours as needed. Pain       Scheduled:   . buprenorphine-naloxone  1 tablet Sublingual TID  . cloNIDine  0.3 mg Transdermal Weekly  . cloNIDine  0.1 mg Oral TID  . DULoxetine  60 mg Oral Daily  . enoxaparin  40 mg Subcutaneous Daily  . folic acid  1 mg Oral Daily  . gabapentin  600 mg Oral TID  . lithium carbonate  300 mg Oral Q12H  . loratadine  10 mg Oral Daily  . multivitamins ther. w/minerals  1 tablet Oral Daily  . nicotine  21 mg Transdermal Daily  . pantoprazole  40 mg Oral Q1200  . phenytoin (DILANTIN) IV  1,000 mg Intravenous Once  . phenytoin  300 mg Oral QHS  . vitamin B-1  100 mg Oral Daily   Or  . thiamine  100 mg Intravenous Daily   Continuous:   . dextrose 5 % and 0.45% NaCl 1,000 mL (10/26/10 0204)  . DISCONTD: dexmedetomidine (PRECEDEX) IV infusion for high rates Stopped (10/28/10 1136)   GNF:[id number], haloperidol lactate, LORazepam, LORazepam  Assesment: He has a history of narcotic abuse alcohol abuse he had DTs and he has bipolar disorder and posttraumatic stress disorder. We are trying to get into an inpatient treatment facility Active Problems:  Delirium tremens  Post traumatic stress disorder (PTSD)  Bipolar 1 disorder    Plan: I discussed Suboxone with our pharmacist and it is short acting. Since he was planning on going home with his mother if we can find Suboxone for him I think we can make it work to send him home on low-dose methadone with his mother dispensing the medication. Once he is off the Suboxone he should not have problems with the narcotic antagonist effect. He can then be admitted to an inpatient  treatment facility when a bed is available so I will plan to discharge him home today    LOS: 12 days   Curlie Macken L 11/05/2010, 8:23 AM

## 2010-11-05 NOTE — Discharge Summary (Signed)
This is an addendum to my previous discharge summary. We had difficulty finding an inpatient rehabilitation facility for Dean [name]. We had difficulty obtaining his Suboxone. There is a bed available today and assuming that he is accepted to that bed he will go to the inpatient rehabilitation facility today. If he is not accepted for that bed then I will plan to discharge him on methadone which his mother will control until he can get into a inpatient rehabilitation facility. His other medications will be as my previous discharge summary has noted

## 2010-11-05 NOTE — Progress Notes (Signed)
UR chart review completed.  

## 2010-11-30 ENCOUNTER — Emergency Department (HOSPITAL_COMMUNITY)
Admission: EM | Admit: 2010-11-30 | Discharge: 2010-11-30 | Disposition: A | Discharge status: Home / Self Care | Payer: Medicare Other | Attending: Emergency Medicine | Admitting: Emergency Medicine

## 2010-11-30 ENCOUNTER — Encounter (HOSPITAL_COMMUNITY): Payer: Self-pay | Admitting: Emergency Medicine

## 2010-11-30 DIAGNOSIS — F1910 Other psychoactive substance abuse, uncomplicated: Secondary | ICD-10-CM | POA: Insufficient documentation

## 2010-11-30 DIAGNOSIS — Z79899 Other long term (current) drug therapy: Secondary | ICD-10-CM | POA: Insufficient documentation

## 2010-11-30 DIAGNOSIS — G40909 Epilepsy, unspecified, not intractable, without status epilepticus: Secondary | ICD-10-CM | POA: Insufficient documentation

## 2010-11-30 DIAGNOSIS — F17200 Nicotine dependence, unspecified, uncomplicated: Secondary | ICD-10-CM | POA: Insufficient documentation

## 2010-11-30 DIAGNOSIS — K7689 Other specified diseases of liver: Secondary | ICD-10-CM | POA: Insufficient documentation

## 2010-11-30 DIAGNOSIS — F4310 Post-traumatic stress disorder, unspecified: Secondary | ICD-10-CM | POA: Insufficient documentation

## 2010-11-30 DIAGNOSIS — Z87828 Personal history of other (healed) physical injury and trauma: Secondary | ICD-10-CM | POA: Insufficient documentation

## 2010-11-30 DIAGNOSIS — F319 Bipolar disorder, unspecified: Secondary | ICD-10-CM | POA: Insufficient documentation

## 2010-11-30 LAB — BASIC METABOLIC PANEL
BUN: 11 mg/dL (ref 6–23)
CO2: 21 mEq/L (ref 19–32)
Calcium: 8.4 mg/dL (ref 8.4–10.5)
Chloride: 99 mEq/L (ref 96–112)
Creatinine, Ser: 0.63 mg/dL (ref 0.50–1.35)
GFR calc Af Amer: >90 mL/min (ref >90)
GFR calc non Af Amer: >90 mL/min (ref >90)
Glucose, Bld: 87 mg/dL (ref 70–99)
Potassium: 3.6 mEq/L (ref 3.5–5.1)
Sodium: 140 mEq/L (ref 135–145)

## 2010-11-30 LAB — CBC
HCT: 39.3 % (ref 39.0–52.0)
Hemoglobin: 13.2 g/dL (ref 13.0–17.0)
MCH: 30.4 pg (ref 26.0–34.0)
MCHC: 33.6 g/dL (ref 30.0–36.0)
MCV: 90.6 fL (ref 78.0–100.0)
Platelets: 205 10*3/uL (ref 150–400)
RBC: 4.34 MIL/uL (ref 4.22–5.81)
RDW: 12.6 % (ref 11.5–15.5)
WBC: 6.6 10*3/uL (ref 4.0–10.5)

## 2010-11-30 LAB — RAPID URINE DRUG SCREEN, HOSP PERFORMED
Amphetamines: NOT DETECTED
Barbiturates: NOT DETECTED
Benzodiazepines: POSITIVE — AB
Cocaine: NOT DETECTED
Opiates: NOT DETECTED
Tetrahydrocannabinol: NOT DETECTED

## 2010-11-30 LAB — DIFFERENTIAL
Basophils Absolute: 0.1 10*3/uL (ref 0.0–0.1)
Basophils Relative: 1 % (ref 0–1)
Eosinophils Absolute: 0 10*3/uL (ref 0.0–0.7)
Eosinophils Relative: 0 % (ref 0–5)
Lymphocytes Relative: 20 % (ref 12–46)
Lymphs Abs: 1.3 10*3/uL (ref 0.7–4.0)
Monocytes Absolute: 0.2 10*3/uL (ref 0.1–1.0)
Monocytes Relative: 4 % (ref 3–12)
Neutro Abs: 5 10*3/uL (ref 1.7–7.7)
Neutrophils Relative %: 75 % (ref 43–77)

## 2010-11-30 LAB — ETHANOL: Alcohol, Ethyl (B): 379 mg/dL — ABNORMAL HIGH (ref 0–11)

## 2010-11-30 NOTE — ED Notes (Signed)
Pt states he drinks a fifth a liquor daily but states he has not had any in two days. Pt states he needs help.

## 2010-11-30 NOTE — ED Notes (Signed)
Information provided for f/u with [facility]; instructed to call today or tomorrow for f/u and further evaluation with [facility] services; pt verbalizes understanding and agrees to call.  A&ox4; cooperative, calm.
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
## 2011-04-03 ENCOUNTER — Encounter (HOSPITAL_COMMUNITY): Payer: Self-pay | Admitting: Emergency Medicine

## 2011-04-03 ENCOUNTER — Emergency Department (HOSPITAL_COMMUNITY)
Admission: EM | Admit: 2011-04-03 | Discharge: 2011-04-04 | Disposition: A | Discharge status: Other Acute Inpatient Hospital | Payer: Medicare Other | Source: Home / Self Care | Attending: Emergency Medicine | Admitting: Emergency Medicine

## 2011-04-03 ENCOUNTER — Other Ambulatory Visit: Payer: Self-pay

## 2011-04-03 DIAGNOSIS — F17200 Nicotine dependence, unspecified, uncomplicated: Secondary | ICD-10-CM | POA: Insufficient documentation

## 2011-04-03 DIAGNOSIS — F10239 Alcohol dependence with withdrawal, unspecified: Secondary | ICD-10-CM

## 2011-04-03 DIAGNOSIS — F10939 Alcohol use, unspecified with withdrawal, unspecified: Secondary | ICD-10-CM | POA: Insufficient documentation

## 2011-04-03 DIAGNOSIS — F319 Bipolar disorder, unspecified: Secondary | ICD-10-CM | POA: Insufficient documentation

## 2011-04-03 DIAGNOSIS — Z79899 Other long term (current) drug therapy: Secondary | ICD-10-CM | POA: Insufficient documentation

## 2011-04-03 DIAGNOSIS — Z8739 Personal history of other diseases of the musculoskeletal system and connective tissue: Secondary | ICD-10-CM | POA: Insufficient documentation

## 2011-04-03 HISTORY — DX: Unspecified convulsions: R56.9

## 2011-04-03 LAB — DIFFERENTIAL
Basophils Absolute: 0 10*3/uL (ref 0.0–0.1)
Basophils Relative: 1 % (ref 0–1)
Eosinophils Absolute: 0 10*3/uL (ref 0.0–0.7)
Eosinophils Relative: 1 % (ref 0–5)
Lymphocytes Relative: 14 % (ref 12–46)
Lymphs Abs: 0.8 10*3/uL (ref 0.7–4.0)
Monocytes Absolute: 0.5 10*3/uL (ref 0.1–1.0)
Monocytes Relative: 9 % (ref 3–12)
Neutro Abs: 4.2 10*3/uL (ref 1.7–7.7)
Neutrophils Relative %: 75 % (ref 43–77)

## 2011-04-03 LAB — COMPREHENSIVE METABOLIC PANEL
ALT: 102 U/L — ABNORMAL HIGH (ref 0–53)
AST: 158 U/L — ABNORMAL HIGH (ref 0–37)
Albumin: 3.8 g/dL (ref 3.5–5.2)
Alkaline Phosphatase: 144 U/L — ABNORMAL HIGH (ref 39–117)
BUN: 3 mg/dL — ABNORMAL LOW (ref 6–23)
CO2: 27 mEq/L (ref 19–32)
Calcium: 9.4 mg/dL (ref 8.4–10.5)
Chloride: 98 mEq/L (ref 96–112)
Creatinine, Ser: 0.55 mg/dL (ref 0.50–1.35)
GFR calc Af Amer: >90 mL/min (ref >90)
GFR calc non Af Amer: >90 mL/min (ref >90)
Glucose, Bld: 112 mg/dL — ABNORMAL HIGH (ref 70–99)
Potassium: 3.9 mEq/L (ref 3.5–5.1)
Sodium: 137 mEq/L (ref 135–145)
Total Bilirubin: 0.5 mg/dL (ref 0.3–1.2)
Total Protein: 7.4 g/dL (ref 6.0–8.3)

## 2011-04-03 LAB — CBC
HCT: 41.4 % (ref 39.0–52.0)
Hemoglobin: 13.7 g/dL (ref 13.0–17.0)
MCH: 31.6 pg (ref 26.0–34.0)
MCHC: 33.1 g/dL (ref 30.0–36.0)
MCV: 95.6 fL (ref 78.0–100.0)
Platelets: 160 10*3/uL (ref 150–400)
RBC: 4.33 MIL/uL (ref 4.22–5.81)
RDW: 14.2 % (ref 11.5–15.5)
WBC: 5.6 10*3/uL (ref 4.0–10.5)

## 2011-04-03 LAB — RAPID URINE DRUG SCREEN, HOSP PERFORMED
Amphetamines: NOT DETECTED
Barbiturates: NOT DETECTED
Benzodiazepines: NOT DETECTED
Cocaine: NOT DETECTED
Opiates: NOT DETECTED
Tetrahydrocannabinol: NOT DETECTED

## 2011-04-03 LAB — ETHANOL: Alcohol, Ethyl (B): <11 mg/dL (ref 0–11)

## 2011-04-03 LAB — PHENYTOIN LEVEL, TOTAL: Phenytoin Lvl: 11.3 ug/mL (ref 10.0–20.0)

## 2011-04-03 MED ORDER — IBUPROFEN 400 MG PO TABS
600.0000 mg | ORAL_TABLET | Freq: Three times a day (TID) | ORAL | Status: DC | PRN
  Administered 2011-04-03 – 2011-04-04 (×3): 600 mg via ORAL
  Filled 2011-04-03: qty 2
  Filled 2011-04-03: qty 1
  Filled 2011-04-03: qty 2

## 2011-04-03 MED ORDER — NICOTINE 21 MG/24HR TD PT24
21.0000 mg | MEDICATED_PATCH | Freq: Every day | TRANSDERMAL | Status: DC
  Administered 2011-04-03: 21 mg via TRANSDERMAL
  Filled 2011-04-03 (×3): qty 1

## 2011-04-03 MED ORDER — PHENYTOIN SODIUM EXTENDED 100 MG PO CAPS
300.0000 mg | ORAL_CAPSULE | Freq: Every day | ORAL | Status: DC
  Administered 2011-04-03 – 2011-04-04 (×2): 300 mg via ORAL
  Filled 2011-04-03 (×4): qty 3

## 2011-04-03 MED ORDER — CLONIDINE HCL 0.1 MG PO TABS
0.1000 mg | ORAL_TABLET | Freq: Three times a day (TID) | ORAL | Status: DC
  Administered 2011-04-03 – 2011-04-04 (×4): 0.1 mg via ORAL
  Filled 2011-04-03 (×8): qty 1

## 2011-04-03 MED ORDER — PANTOPRAZOLE SODIUM 40 MG PO TBEC
40.0000 mg | DELAYED_RELEASE_TABLET | Freq: Every day | ORAL | Status: DC
  Administered 2011-04-03 – 2011-04-04 (×2): 40 mg via ORAL
  Filled 2011-04-03 (×4): qty 1

## 2011-04-03 MED ORDER — LORAZEPAM 2 MG/ML IJ SOLN
2.0000 mg | Freq: Once | INTRAMUSCULAR | Status: AC
  Administered 2011-04-03: 2 mg via INTRAVENOUS
  Filled 2011-04-03: qty 1

## 2011-04-03 MED ORDER — SODIUM CHLORIDE 0.9 % IV BOLUS (SEPSIS)
1000.0000 mL | Freq: Once | INTRAVENOUS | Status: AC
  Administered 2011-04-03: 1000 mL via INTRAVENOUS

## 2011-04-03 MED ORDER — ONDANSETRON HCL 4 MG PO TABS
4.0000 mg | ORAL_TABLET | Freq: Three times a day (TID) | ORAL | Status: DC | PRN
  Administered 2011-04-04: 4 mg via ORAL
  Filled 2011-04-03 (×2): qty 1

## 2011-04-03 MED ORDER — CLONIDINE HCL 0.1 MG PO TABS
0.1000 mg | ORAL_TABLET | Freq: Once | ORAL | Status: AC
  Administered 2011-04-03: 0.1 mg via ORAL
  Filled 2011-04-03: qty 1

## 2011-04-03 MED ORDER — LORAZEPAM 1 MG PO TABS
2.0000 mg | ORAL_TABLET | ORAL | Status: DC | PRN
  Administered 2011-04-03 – 2011-04-04 (×11): 2 mg via ORAL
  Filled 2011-04-03 (×5): qty 2
  Filled 2011-04-03: qty 1
  Filled 2011-04-03 (×5): qty 2
  Filled 2011-04-03: qty 1

## 2011-04-03 MED ORDER — DULOXETINE HCL 60 MG PO CPEP
60.0000 mg | ORAL_CAPSULE | Freq: Every day | ORAL | Status: DC
  Administered 2011-04-03 – 2011-04-04 (×2): 60 mg via ORAL
  Filled 2011-04-03 (×4): qty 1

## 2011-04-03 NOTE — ED Notes (Signed)
Patient requesting detox from ETOH. Reports last drink was last night and reports that he drinks 1/5 vodka daily. Patient c/o shakiness and states he feels anxious.

## 2011-04-03 NOTE — ED Notes (Signed)
Patient c/o headache 5/10 on NPS and requesting Ativan for "nerves". CIWA score 11. PRN ibuprofen and ativan orders available and given to patient.
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
## 2011-04-03 NOTE — ED Notes (Signed)
Denies SI/HI
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
## 2011-04-03 NOTE — ED Notes (Signed)
Report received from penny, rn. Boyd Kerbs states that day shift nurse had asked dr. Bebe Shaggy if patient should be on alcohol withdrawal management protocol with phenobarbital, nurse reported to penny that dr. Bebe Shaggy said no.

## 2011-04-03 NOTE — ED Notes (Signed)
Per Samson Frederic, No available detox beds today. She states that beds might become available tonight or tomorrow.

## 2011-04-03 NOTE — ED Notes (Signed)
Ella with act at bedside.

## 2011-04-04 ENCOUNTER — Inpatient Hospital Stay (HOSPITAL_COMMUNITY)
Admission: AD | Admit: 2011-04-04 | Discharge: 2011-04-14 | DRG: 897 | Disposition: A | Discharge status: Home / Self Care | Payer: Medicare Other | Source: Ambulatory Visit | Attending: Psychiatry | Admitting: Psychiatry

## 2011-04-04 ENCOUNTER — Encounter (HOSPITAL_COMMUNITY): Payer: Self-pay | Admitting: *Deleted

## 2011-04-04 DIAGNOSIS — F4310 Post-traumatic stress disorder, unspecified: Secondary | ICD-10-CM

## 2011-04-04 DIAGNOSIS — Z79899 Other long term (current) drug therapy: Secondary | ICD-10-CM

## 2011-04-04 DIAGNOSIS — F1421 Cocaine dependence, in remission: Secondary | ICD-10-CM

## 2011-04-04 DIAGNOSIS — F1120 Opioid dependence, uncomplicated: Principal | ICD-10-CM

## 2011-04-04 DIAGNOSIS — R569 Unspecified convulsions: Secondary | ICD-10-CM

## 2011-04-04 DIAGNOSIS — F1994 Other psychoactive substance use, unspecified with psychoactive substance-induced mood disorder: Secondary | ICD-10-CM

## 2011-04-04 DIAGNOSIS — R197 Diarrhea, unspecified: Secondary | ICD-10-CM

## 2011-04-04 DIAGNOSIS — F10939 Alcohol use, unspecified with withdrawal, unspecified: Secondary | ICD-10-CM

## 2011-04-04 DIAGNOSIS — F19939 Other psychoactive substance use, unspecified with withdrawal, unspecified: Secondary | ICD-10-CM

## 2011-04-04 DIAGNOSIS — F39 Unspecified mood [affective] disorder: Secondary | ICD-10-CM

## 2011-04-04 DIAGNOSIS — F10239 Alcohol dependence with withdrawal, unspecified: Secondary | ICD-10-CM

## 2011-04-04 DIAGNOSIS — K7689 Other specified diseases of liver: Secondary | ICD-10-CM

## 2011-04-04 DIAGNOSIS — M129 Arthropathy, unspecified: Secondary | ICD-10-CM

## 2011-04-04 DIAGNOSIS — G40909 Epilepsy, unspecified, not intractable, without status epilepticus: Secondary | ICD-10-CM

## 2011-04-04 DIAGNOSIS — R748 Abnormal levels of other serum enzymes: Secondary | ICD-10-CM

## 2011-04-04 DIAGNOSIS — F1011 Alcohol abuse, in remission: Secondary | ICD-10-CM

## 2011-04-04 DIAGNOSIS — R112 Nausea with vomiting, unspecified: Secondary | ICD-10-CM

## 2011-04-04 DIAGNOSIS — F319 Bipolar disorder, unspecified: Secondary | ICD-10-CM

## 2011-04-04 DIAGNOSIS — K760 Fatty (change of) liver, not elsewhere classified: Secondary | ICD-10-CM

## 2011-04-04 DIAGNOSIS — F1020 Alcohol dependence, uncomplicated: Secondary | ICD-10-CM

## 2011-04-04 DIAGNOSIS — F19921 Other psychoactive substance use, unspecified with intoxication with delirium: Secondary | ICD-10-CM

## 2011-04-04 DIAGNOSIS — F10931 Alcohol use, unspecified with withdrawal delirium: Secondary | ICD-10-CM

## 2011-04-04 DIAGNOSIS — F10231 Alcohol dependence with withdrawal delirium: Secondary | ICD-10-CM

## 2011-04-04 MED ORDER — HYDROXYZINE HCL 25 MG PO TABS
25.0000 mg | ORAL_TABLET | Freq: Every evening | ORAL | Status: DC | PRN
  Administered 2011-04-06 – 2011-04-09 (×3): 25 mg via ORAL

## 2011-04-04 MED ORDER — ADULT MULTIVITAMIN W/MINERALS CH
1.0000 | ORAL_TABLET | Freq: Every day | ORAL | Status: DC
  Administered 2011-04-05 – 2011-04-14 (×10): 1 via ORAL
  Filled 2011-04-04: qty 14
  Filled 2011-04-04 (×9): qty 1
  Filled 2011-04-04: qty 14
  Filled 2011-04-04: qty 1

## 2011-04-04 MED ORDER — ALUM & MAG HYDROXIDE-SIMETH 200-200-20 MG/5ML PO SUSP
30.0000 mL | ORAL | Status: DC | PRN
  Administered 2011-04-08: 30 mL via ORAL

## 2011-04-04 MED ORDER — MAGNESIUM HYDROXIDE 400 MG/5ML PO SUSP: 30.0000 mL | Freq: Every day | ORAL | Status: DC | PRN

## 2011-04-04 MED ORDER — CHLORDIAZEPOXIDE HCL 25 MG PO CAPS: 25.0000 mg | ORAL_CAPSULE | Freq: Three times a day (TID) | ORAL | Status: DC

## 2011-04-04 MED ORDER — CHLORDIAZEPOXIDE HCL 25 MG PO CAPS: 25.0000 mg | ORAL_CAPSULE | Freq: Every day | ORAL | Status: DC

## 2011-04-04 MED ORDER — ONDANSETRON 4 MG PO TBDP
4.0000 mg | ORAL_TABLET | Freq: Four times a day (QID) | ORAL | Status: AC | PRN
  Administered 2011-04-05 – 2011-04-07 (×3): 4 mg via ORAL

## 2011-04-04 MED ORDER — IBUPROFEN 600 MG PO TABS
600.0000 mg | ORAL_TABLET | Freq: Three times a day (TID) | ORAL | Status: DC | PRN
  Administered 2011-04-05 – 2011-04-11 (×3): 600 mg via ORAL
  Filled 2011-04-04 (×3): qty 1

## 2011-04-04 MED ORDER — VITAMIN B-1 100 MG PO TABS
100.0000 mg | ORAL_TABLET | Freq: Every day | ORAL | Status: DC
  Administered 2011-04-05 – 2011-04-14 (×10): 100 mg via ORAL
  Filled 2011-04-04 (×12): qty 1

## 2011-04-04 MED ORDER — CHLORDIAZEPOXIDE HCL 25 MG PO CAPS
25.0000 mg | ORAL_CAPSULE | Freq: Four times a day (QID) | ORAL | Status: AC | PRN
  Administered 2011-04-05 – 2011-04-07 (×7): 25 mg via ORAL
  Filled 2011-04-04 (×6): qty 1

## 2011-04-04 MED ORDER — LOPERAMIDE HCL 2 MG PO CAPS
2.0000 mg | ORAL_CAPSULE | ORAL | Status: AC | PRN
  Administered 2011-04-06: 2 mg via ORAL
  Administered 2011-04-06 – 2011-04-07 (×2): 4 mg via ORAL

## 2011-04-04 MED ORDER — CHLORDIAZEPOXIDE HCL 25 MG PO CAPS: 25.0000 mg | ORAL_CAPSULE | ORAL | Status: DC

## 2011-04-04 MED ORDER — DULOXETINE HCL 60 MG PO CPEP
60.0000 mg | ORAL_CAPSULE | Freq: Every day | ORAL | Status: DC
  Administered 2011-04-05 – 2011-04-14 (×9): 60 mg via ORAL
  Filled 2011-04-04 (×2): qty 1
  Filled 2011-04-04: qty 14
  Filled 2011-04-04 (×2): qty 1
  Filled 2011-04-04: qty 14
  Filled 2011-04-04 (×7): qty 1

## 2011-04-04 MED ORDER — ACETAMINOPHEN 325 MG PO TABS
ORAL_TABLET | ORAL | Status: AC
  Administered 2011-04-04: 650 mg
  Filled 2011-04-04: qty 2

## 2011-04-04 MED ORDER — PHENYTOIN SODIUM EXTENDED 100 MG PO CAPS
300.0000 mg | ORAL_CAPSULE | Freq: Every day | ORAL | Status: DC
  Administered 2011-04-05 – 2011-04-11 (×7): 300 mg via ORAL
  Filled 2011-04-04 (×8): qty 3

## 2011-04-04 MED ORDER — CHLORDIAZEPOXIDE HCL 25 MG PO CAPS
25.0000 mg | ORAL_CAPSULE | Freq: Four times a day (QID) | ORAL | Status: DC
  Administered 2011-04-04 – 2011-04-05 (×3): 25 mg via ORAL
  Filled 2011-04-04 (×4): qty 1

## 2011-04-04 MED ORDER — PANTOPRAZOLE SODIUM 40 MG PO TBEC
40.0000 mg | DELAYED_RELEASE_TABLET | Freq: Every day | ORAL | Status: DC
  Administered 2011-04-05 – 2011-04-08 (×4): 40 mg via ORAL
  Filled 2011-04-04 (×10): qty 1

## 2011-04-04 MED ORDER — THIAMINE HCL 100 MG/ML IJ SOLN
100.0000 mg | Freq: Once | INTRAMUSCULAR | Status: AC
  Administered 2011-04-05: 100 mg via INTRAMUSCULAR

## 2011-04-04 NOTE — ED Notes (Signed)
Saw pocket knife in patient's pants pocket, asked patient if he had any valuables, pocket knifes, etc to secure with security. Stated that he just had his clothes. Asked again if patient had knife, stated yes and gave me knife. Silver and wooden pocket knife secured with Onalee Hua from security.

## 2011-04-04 NOTE — ED Notes (Signed)
Notified charge nurse, brenda, that patient had a pocket knife in his jeans and that i secured it with security. Asked her if patient needed to be wanded. Stated that he did not since he isn't experiencing si, hi and since he is voluntary.

## 2011-04-04 NOTE — ED Notes (Signed)
Pt alert, oriented and cooperative.  Pt demonstrating mild anxiety and requesting Ativan.  No distress noted.

## 2011-04-04 NOTE — ED Notes (Signed)
Report given to [facility]. 

## 2011-04-04 NOTE — ED Notes (Signed)
Patient is sleeping at this time.

## 2011-04-04 NOTE — ED Notes (Signed)
Pt transported to [facility], via [facility].

## 2011-04-04 NOTE — ED Notes (Signed)
Patient resting in room with eyes closed. NAD noted at this time.  

## 2011-04-04 NOTE — Progress Notes (Signed)
Mr Yeo has been accepted to [facility] by Lynann Bologna NP. He  will be attended by Dr Elwin Mocha. He will be a voluntary admission and will be transported by [employer]. Support  Paperwork and precert completed. Dr Clarene Duke is in agreement with this disposition.

## 2011-04-04 NOTE — ED Notes (Signed)
Advised not time for ativan yet but offered ibuprofen.

## 2011-04-04 NOTE — Tx Team (Signed)
Initial Interdisciplinary Treatment Plan  PATIENT STRENGTHS: (choose at least two) Ability for insight Active sense of humor Average or above average intelligence Capable of independent living Communication skills Financial means General fund of knowledge Motivation for treatment/growth Supportive family/friends  PATIENT STRESSORS: Health problems Legal issue Substance abuse   PROBLEM LIST: Problem List/Patient Goals Date to be addressed Date deferred Reason deferred Estimated date of resolution  ETOH abuse 04-04-11     HTN 04-04-11     Legal Issues 04-04-11                                          DISCHARGE CRITERIA:  Ability to meet basic life and health needs Adequate post-discharge living arrangements Improved stabilization in mood, thinking, and/or behavior Medical problems require only outpatient monitoring Motivation to continue treatment in a less acute level of care Withdrawal symptoms are absent or subacute and managed without 24-hour nursing intervention  PRELIMINARY DISCHARGE PLAN: Attend aftercare/continuing care group Attend PHP/IOP Outpatient therapy Participate in family therapy  PATIENT/FAMIILY INVOLVEMENT: This treatment plan has been presented to and reviewed with the patient, Dean [name], and/or family member.  The patient and family have been given the opportunity to ask questions and make suggestions.  [name] 04/04/2011, 11:12 PM
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
## 2011-04-05 MED ORDER — CHLORDIAZEPOXIDE HCL 25 MG PO CAPS
50.0000 mg | ORAL_CAPSULE | Freq: Four times a day (QID) | ORAL | Status: AC
  Administered 2011-04-05 (×2): 50 mg via ORAL
  Filled 2011-04-05 (×2): qty 2

## 2011-04-05 MED ORDER — CLONIDINE HCL 0.1 MG PO TABS
0.1000 mg | ORAL_TABLET | Freq: Four times a day (QID) | ORAL | Status: AC
  Administered 2011-04-05 – 2011-04-06 (×7): 0.1 mg via ORAL
  Filled 2011-04-05 (×9): qty 1

## 2011-04-05 MED ORDER — DICYCLOMINE HCL 20 MG PO TABS
20.0000 mg | ORAL_TABLET | ORAL | Status: AC | PRN
  Administered 2011-04-06: 20 mg via ORAL
  Filled 2011-04-05: qty 1

## 2011-04-05 MED ORDER — GABAPENTIN 300 MG PO CAPS
600.0000 mg | ORAL_CAPSULE | Freq: Three times a day (TID) | ORAL | Status: DC
  Administered 2011-04-05 – 2011-04-08 (×9): 600 mg via ORAL
  Filled 2011-04-05 (×17): qty 2

## 2011-04-05 MED ORDER — CHLORDIAZEPOXIDE HCL 25 MG PO CAPS
50.0000 mg | ORAL_CAPSULE | Freq: Every day | ORAL | Status: AC
  Administered 2011-04-08: 50 mg via ORAL
  Filled 2011-04-05: qty 2
  Filled 2011-04-05: qty 1

## 2011-04-05 MED ORDER — CHLORDIAZEPOXIDE HCL 25 MG PO CAPS
50.0000 mg | ORAL_CAPSULE | Freq: Three times a day (TID) | ORAL | Status: AC
  Administered 2011-04-06 (×3): 50 mg via ORAL
  Filled 2011-04-05 (×3): qty 2

## 2011-04-05 MED ORDER — NAPROXEN 500 MG PO TABS
500.0000 mg | ORAL_TABLET | Freq: Two times a day (BID) | ORAL | Status: AC | PRN
  Administered 2011-04-06: 500 mg via ORAL
  Filled 2011-04-05: qty 1

## 2011-04-05 MED ORDER — HYDROXYZINE HCL 25 MG PO TABS: 25.0000 mg | ORAL_TABLET | Freq: Four times a day (QID) | ORAL | Status: DC | PRN

## 2011-04-05 MED ORDER — NICOTINE POLACRILEX 2 MG MT GUM
2.0000 mg | CHEWING_GUM | OROMUCOSAL | Status: DC | PRN
  Administered 2011-04-05 – 2011-04-11 (×4): 2 mg via ORAL
  Filled 2011-04-05: qty 1

## 2011-04-05 MED ORDER — CHLORDIAZEPOXIDE HCL 25 MG PO CAPS
50.0000 mg | ORAL_CAPSULE | ORAL | Status: AC
  Administered 2011-04-07 (×2): 50 mg via ORAL
  Filled 2011-04-05: qty 2
  Filled 2011-04-05: qty 1

## 2011-04-05 MED ORDER — ONDANSETRON 4 MG PO TBDP: 4.0000 mg | ORAL_TABLET | Freq: Four times a day (QID) | ORAL | Status: DC | PRN

## 2011-04-05 MED ORDER — METHOCARBAMOL 500 MG PO TABS: 500.0000 mg | ORAL_TABLET | Freq: Three times a day (TID) | ORAL | Status: AC | PRN

## 2011-04-05 MED ORDER — LOPERAMIDE HCL 2 MG PO CAPS: 2.0000 mg | ORAL_CAPSULE | ORAL | Status: DC | PRN

## 2011-04-05 MED ORDER — CLONIDINE HCL 0.1 MG PO TABS
0.1000 mg | ORAL_TABLET | ORAL | Status: AC
  Administered 2011-04-07 – 2011-04-08 (×4): 0.1 mg via ORAL
  Filled 2011-04-05 (×4): qty 1

## 2011-04-05 MED ORDER — CLONIDINE HCL 0.1 MG PO TABS
0.1000 mg | ORAL_TABLET | Freq: Every day | ORAL | Status: AC
  Administered 2011-04-09 – 2011-04-10 (×2): 0.1 mg via ORAL
  Filled 2011-04-05 (×2): qty 1

## 2011-04-05 NOTE — Progress Notes (Signed)
[facility] Group Notes:  (Counselor/Nursing/MHT/Case Management/Adjunct)  04/05/2011 6:12 PM  Type of Therapy:  Group Therapy at 11:00  Participation Level:  Did Not Attend   [name] 04/05/2011, 6:12 PM

## 2011-04-05 NOTE — Treatment Plan (Signed)
Dean [name] was not seen in AM group.  I was informed that he was suffering from significant withdrawal symptoms and would be unable to attend. Per State Regulation 482.30 This chart was reviewed for medical necessity with respect to the patient's Admission/Duration of stay. Dean [name], [place]  04/05/2011  Next Review Date:  04/08/11

## 2011-04-05 NOTE — Progress Notes (Signed)
Patient notes that he is having "bad withdrawal" symptoms today.  Has requested multiple PRN medications.  He was also started on clonidine protocol today.  He denies suicidal ideation.  States he was on Suboxone 3 times a day prior to admission and wants to be back on it.  He was advised that we do not use that drug here.  He has been monitored per protocol.  Tolerating medications thus far.

## 2011-04-05 NOTE — [facility] Suicide Risk Assessment (Signed)
Suicide Risk Assessment  Admission Assessment      Demographic factors:  See chart.  Current Mental Status:  Patient seen and evaluated. Chart reviewed. Patient stated that his mood was "not good". His affect was mood congruent and anxious.  Sig w/d noted. He denied any current thoughts of self injurious behavior, suicidal ideation or homicidal ideation. He denied any significant depressive signs or symptoms at this time. There were no auditory or visual hallucinations, paranoia, delusional thought processes, or mania noted.  Thought process was linear and goal directed.  No psychomotor agitation or retardation was noted. His speech was normal rate, tone and volume. Eye contact was good. Judgment and insight are fair.  Patient has been up and engaged on the unit.  No acute safety concerns reported from team.  Pt said he "loves himself".  Loss Factors:  Loss Factors: Legal issues;Financial problems / change in socioeconomic status  Historical Factors:  Historical Factors: Family history of mental illness or substance abuse;Impulsivity s/p prison time; denied hx SI/SIB/attempts/plans; B&E in past/other "felonious charges" to "get drug money".  Risk Reduction Factors:  Risk Reduction Factors: Sense of responsibility to family (positive family support); interested in residential Tx  CLINICAL FACTORS: Alcohol Dependence & W/D; Benzodiazepine W/D from Klonopin; Opioid Withdrawal from Suboxone; Mood Disorder NOS; PTSD, per Hx; Cocaine Dependence, in remission  COGNITIVE FEATURES THAT CONTRIBUTE TO RISK: limited insight; impulsivity.  SUICIDE RISK: Patient is currently viewed as a low risk of harm to himself in light of his history and risk factors. There are no acute safety concerns on the unit.    PLAN OF CARE: Pt admitted for crisis stabilization, detox and treatment.  Please see orders, increased taper in light of w/d from alcohol and benzos with seizure history. Medications reviewed with pt and  medication education provided. Will continue q15 minute checks per unit protocol.  No clinical indication for one on one level of observation at this time.  Pt contracting for safety.  Mental health treatment, medication management and continued sobriety will mitigate against the increased risk of harm to self and/or others.  Discussed the importance of recovery with pt, as well as, tools to move forward in a healthy & safe manner.  Pt agreeable with the plan.  Discussed with the team.  Interested in residential Tx.   Lupe Carney 04/05/2011, 12:56 PM

## 2011-04-05 NOTE — Progress Notes (Signed)
Adult Psychosocial Assessment Update Interdisciplinary Team  Previous [facility] admissions/discharges:  Admissions Discharges  Date:08/06/11 Date:  Date: Date:  Date: Date:  Date: Date:  Date: Date:   Changes since the last Psychosocial Assessment (including adherence to outpatient mental health and/or substance abuse treatment, situational issues contributing to decompensation and/or relapse). Patient stated that he had stayed sober for several months after he got out of the hospital but then had knee surgery April 22. He stated that the pain was so bad and the medications weren't working so he started drinking again. He wants detox from alcohol. He stated that mother dispenses his Suboxone and his Klonopin and he does not misuse.             Discharge Plan 1. Will you be returning to the same living situation after discharge?   Yes:X No:      If no, what is your plan?    Lives alone. Mother lives about 2 minutes down the road       2. Would you like a referral for services when you are discharged? Yes:  X   If yes, for what services?  No:       Patient is a patient at [facility] and has an appointment April 4 with Amy.He would like a 28 day program.       Summary and Recommendations (to be completed by the evaluator) Patient is a 31 year old white male with diagnosis of Alcohol Dependence. He is requesting detox from alcohol. He drinks 1/5 to half a gallon of alcohol daily. This has cause financial and legal problems.  Patient will benefit from crisis stabilization, detox, group therapy and psycho-education groups to work on coping skills to deal with stressors and for better understanding of triggers and negative influences of his addiction. Case manager will refer back to [facility] and will explore options with patient on inpatient treatment programs.                       Signature:  Veto Kemps, 04/05/2011 8:59 AM

## 2011-04-06 DIAGNOSIS — F1020 Alcohol dependence, uncomplicated: Secondary | ICD-10-CM

## 2011-04-06 DIAGNOSIS — F10239 Alcohol dependence with withdrawal, unspecified: Secondary | ICD-10-CM

## 2011-04-06 DIAGNOSIS — F1120 Opioid dependence, uncomplicated: Principal | ICD-10-CM
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
## 2011-04-06 NOTE — Progress Notes (Signed)
Patient ID: Dean [name], male   DOB: 07-Oct-1980, 31 y.o.   MRN: [id number] Pt. Was found to be hoarding food in his room Explained to him that if he was hungry food would be provided.

## 2011-04-06 NOTE — Progress Notes (Signed)
Patient ID: Dean [name], male   DOB: [birth year], 31 y.o.   MRN: [id number] [facility] MD Progress Note  04/06/2011 4:16 PM  Diagnosis:  Axis I: alcohol dependence, opioid dependence, benzodiazepine dependence, PTSD  ADL's:  Intact  Sleep:  Yes,  AEB:  Appetite: Pt is eating meals Suicidal Ideation:  Homicidal Ideation:    Subjective:  Pt. Was asleep during my rounds today, but I have seen him up and around on the unit today.  He has attended groups and has stated that he has no intention of stopping Klonopin or his Suboxone use. BP 128/86  Pulse 73  Temp(Src) 97.5 F (36.4 C) (Oral)  Resp 16 Objective: That [name] is unmotivated to stop all addictive substances is worrisome. This raises the question of suitability for referral for rehabilitation treatment and likelihood of relapse upon discharge from [facility]. Sleep:  Number of Hours: 6.25   Vital Signs:Blood pressure 128/86, pulse 73, temperature 97.5 F (36.4 C), temperature source Oral, resp. rate 16.  Lab Results: No results found for this or any previous visit (from the past 48 hour(s)).  Physical Findings: AIMS:  , ,  ,  ,    CIWA:  CIWA-Ar Total: 7  COWS:  COWS Total Score: 3   Treatment Plan Summary: Daily contact with patient to assess and evaluate symptoms and progress in treatment Medication management  Plan: Recommend discussion with his PCP to discuss the suitability of suboxone treatment with klonopin.  He sees Dr. Juanetta Gosling in [place] and Dr. Ronney Asters is his psychiatrist.  [name] 04/06/2011, 4:16 PM

## 2011-04-06 NOTE — Progress Notes (Signed)
Pt reports he is having a difficulty time with detox.  He is having tremors with increased anxiety.  He denies nausea or diarrhea at this time.  He has required several prn meds today.  He attended evening group.  He reports that group was helpful to him.  He denies SI/HI.  He had no complaints of pain.  Safety maintained with q15 minute checks.

## 2011-04-06 NOTE — Discharge Planning (Signed)
Pt present in morning group. Pt admits to having an addiction to alcohol. Pt reports taking klonopin and suboxone and feels that the klonopin is helpful for his anxiety and that the suboxone is helpful for treating his opiate addiction. Pt states that he has no intention of coming off of the klonopin and suboxone and would like to be able to go to a rehab facility for his alcoholism where he can continue to take both. CM explained to pt that there were no known rehab facilities that would allow him to take klonopin and suboxone and pt responded by saying he is still open to going to rehab and would indefinitely began to take klonopin and suboxone again after completing his time at rehab. Pt is currently on disability and lives alone.

## 2011-04-06 NOTE — Progress Notes (Signed)
Patient ID: Dean [name], male   DOB: 04-16-1980, 31 y.o.   MRN: [id number]  Patient was pleasant and cooperative during the assessment.  Pt stated he was having increased anxiety. Explained to the writer that he was diagnosed with anxiety disorder at 53yrs of age.  Pt's increased consumption of ETOH began apprx 3 yrs.  Asked the writer if he could get something ordered for sleep.  Stated that trazodone worked well in the past. After reviewing pt's sleep time and looking at meds, Clinical research associate encouraged pt to take vistaril and librium, and speak with the NP or Dr in the morning if further assistance was needed. Support and encouragement was offered.
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
## 2011-04-06 NOTE — Progress Notes (Signed)
[facility] Group Notes:  (Counselor/Nursing/MHT/Case Management/Adjunct)  04/06/2011 2:28 PM  Type of Therapy:  1:15PM Group Therapy  Participation Level:  Did Not Attend  Dean [name] 04/06/2011, 2:28 PM

## 2011-04-07 MED ORDER — TRAZODONE 25 MG HALF TABLET
75.0000 mg | ORAL_TABLET | Freq: Every day | ORAL | Status: DC
  Administered 2011-04-07 – 2011-04-11 (×5): 75 mg via ORAL
  Filled 2011-04-07 (×6): qty 3

## 2011-04-07 NOTE — Progress Notes (Signed)
04/07/2011         Time: 1415      Group Topic/Focus: The focus of the group is on enhancing the patients' ability to cope with stressors by understanding what coping is, why it is important, the negative effects of stress and developing healthier coping skills. Patients asked to complete a fifteen minute plan, outlining three triggers, three supports, and fifteen coping activities.  Participation Level: Active  Participation Quality: Appropriate and Attentive  Affect: Blunted  Cognitive: Oriented   Additional Comments: Patient complaining of stomach pain, but reports some relief after relaxation exercise.  Kayon Dozier 04/07/2011 3:54 PM

## 2011-04-07 NOTE — Treatment Plan (Signed)
Interdisciplinary Treatment Plan Update (Adult)  Date: 04/07/2011  Time Reviewed: 8:10 AM   Progress in Treatment: Attending groups: Yes Participating in groups: Yes Taking medication as prescribed: Yes Tolerating medication: Yes   Family/Significant othe contact made: Counselor will call if suicide prevention info needed  Patient understands diagnosis:  Yes  As evidenced by asking for help with addictions Discussing patient identified problems/goals with staff:  Yes  See below Medical problems stabilized or resolved:  Yes Denies suicidal/homicidal ideation: Yes  In tx team Issues/concerns per patient self-inventory:  Yes  Poor sleep, withdrawal symptoms, Depression 3, hopelessness 3 Other:  New problem(s) identified: N/A  Reason for Continuation of Hospitalization: Medication stabilization Withdrawal symptoms  Interventions implemented related to continuation of hospitalization:   Additional comments:  Estimated length of stay:2-3 days  Discharge Plan:  States he wants referral to rehab  New goal(s): N/A  Review of initial/current patient goals per problem list:   1.  Goal(s): Safely detox from benzos, alcohol  Met:  No  Target date:3/30  As evidenced by: Decrease in CIWA to 0, stable vitals  2.  Goal (s):Refer to rehab  Met:  No  Target date:4/1  As evidenced by:Bed confirmation  3.  Goal(s):  Met:  No  Target date:  As evidenced by:  4.  Goal(s):  Met:  No  Target date:  As evidenced by:  Attendees: Patient:  Dean [name] 04/07/2011 8:10 AM  Family:     Physician:  Lupe Carney 04/07/2011 8:10 AM   Nursing: Roswell Miners   04/07/2011 8:10 AM   Case Manager:  Richelle Ito, LCSW 04/07/2011 8:10 AM   Counselor:  Ronda Fairly, LCSWA 04/07/2011 8:10 AM   Other: [name]  04/07/2011 8:10 AM  Other:     Other:     Other:      Scribe for Treatment Team:   Ida Rogue, 04/07/2011 8:10 AM

## 2011-04-07 NOTE — Progress Notes (Signed)
Pt behavior is appropriate this shift and he is compliant with scheduled medication protocols.  Stated that he wanted "to get off alcohol" but has no intention of stopping narcotic use.  Explained addiction disease model.  No physical complaints and no prn medications requested.  Denies SI and contracts for safety. Cont to assess and cont 15' checks for safety.

## 2011-04-07 NOTE — Progress Notes (Signed)
[facility] Group Notes:  (Counselor/Nursing/MHT/Case Management/Adjunct)  04/07/2011 12:42 PM  Type of Therapy:  Group Therapy  Participation Level:  Did Not Attend  Dean [name] 04/07/2011, 12:42 PM

## 2011-04-07 NOTE — Progress Notes (Signed)
[facility] Group Notes:  (Counselor/Nursing/MHT/Case Management/Adjunct)  04/07/2011 2:13 PM  Type of Therapy:  1:15PM Group Therapy  Participation Level:  Did Not Attend  Dean [name] 04/07/2011, 2:13 PM

## 2011-04-07 NOTE — Progress Notes (Signed)
[facility] MD Progress Note  04/07/2011 2:39 PM  Diagnosis: Alcohol Dependence & W/D; Benzodiazepine W/D from Klonopin; Opioid Withdrawal from Suboxone; Mood Disorder NOS; PTSD, per Hx; Cocaine Dependence, in remission    ADL's:  Intact  Sleep: Poor   Pt. Met with the treatment team this moring and asked for his trazodone to be restarted. He has also requested an "abstinence based residential rehab program" stating he wants to "be off everything."  Appetite:  Fair  Suicidal Ideation:  denies Homicidal Ideation:  denies  AEB (as evidenced by):  Mental Status Examination/Evaluation: Objective:  Appearance: Casual  Eye Contact::  Good  Speech:  Normal Rate  Volume:  Normal  Mood:  Euthymic  Affect:  Appropriate  Thought Process:  Coherent  Orientation:  Full  Thought Content:  WDL  Suicidal Thoughts:  No  Homicidal Thoughts:  No  Memory:  Immediate;   Good  Judgement:  Intact  Insight:  Good  Psychomotor Activity:  Normal  Concentration:  Good  Recall:  Good  Akathisia:  No  Handed:    AIMS (if indicated):     Assets:  Communication Skills Desire for Improvement  Sleep:  Number of Hours: 5.25    Vital Signs:Blood pressure 121/79, pulse 62, temperature 97.7 F (36.5 C), temperature source Oral, resp. rate 17. Current Medications: Current Facility-Administered Medications  Medication Dose Route Frequency Provider Last Rate Last Dose  . alum & mag hydroxide-simeth (MAALOX/MYLANTA) 200-200-20 MG/5ML suspension 30 mL  30 mL Oral Q4H PRN [name], NP      . chlordiazePOXIDE (LIBRIUM) capsule 25 mg  25 mg Oral Q6H PRN [name], NP   25 mg at 04/07/11 0651  . chlordiazePOXIDE (LIBRIUM) capsule 50 mg  50 mg Oral TID Alyson Kuroski-Mazzei, DO   50 mg at 04/06/11 1727   Followed by  . chlordiazePOXIDE (LIBRIUM) capsule 50 mg  50 mg Oral BH-qamhs Alyson Kuroski-Mazzei, DO   50 mg at 04/07/11 0848   Followed by  . chlordiazePOXIDE (LIBRIUM) capsule 50 mg  50 mg Oral  Daily Alyson Kuroski-Mazzei, DO      . cloNIDine (CATAPRES) tablet 0.1 mg  0.1 mg Oral QID Alyson Kuroski-Mazzei, DO   0.1 mg at 04/06/11 2111   Followed by  . cloNIDine (CATAPRES) tablet 0.1 mg  0.1 mg Oral BH-qamhs Alyson Kuroski-Mazzei, DO   0.1 mg at 04/07/11 0848   Followed by  . cloNIDine (CATAPRES) tablet 0.1 mg  0.1 mg Oral QAC breakfast Alyson Kuroski-Mazzei, DO      . dicyclomine (BENTYL) tablet 20 mg  20 mg Oral Q4H PRN Alyson Kuroski-Mazzei, DO   20 mg at 04/06/11 0630  . DULoxetine (CYMBALTA) DR capsule 60 mg  60 mg Oral Daily [name], NP   60 mg at 04/07/11 0850  . gabapentin (NEURONTIN) capsule 600 mg  600 mg Oral TID Alyson Kuroski-Mazzei, DO   600 mg at 04/07/11 1422  . hydrOXYzine (ATARAX/VISTARIL) tablet 25 mg  25 mg Oral QHS PRN [name], NP   25 mg at 04/06/11 2220  . ibuprofen (ADVIL,MOTRIN) tablet 600 mg  600 mg Oral Q8H PRN [name], NP   600 mg at 04/05/11 0006  . loperamide (IMODIUM) capsule 2-4 mg  2-4 mg Oral PRN [name], NP   2 mg at 04/06/11 1607  . magnesium hydroxide (MILK OF MAGNESIA) suspension 30 mL  30 mL Oral Daily PRN [name], NP      .  methocarbamol (ROBAXIN) tablet 500 mg  500 mg Oral Q8H PRN Alyson Kuroski-Mazzei, DO      . mulitivitamin with minerals tablet 1 tablet  1 tablet Oral Daily [name], NP   1 tablet at 04/07/11 0847  . naproxen (NAPROSYN) tablet 500 mg  500 mg Oral BID PRN Alyson Kuroski-Mazzei, DO   500 mg at 04/06/11 0630  . nicotine polacrilex (NICORETTE) gum 2 mg  2 mg Oral PRN [name], NP   2 mg at 04/05/11 1605  . ondansetron (ZOFRAN-ODT) disintegrating tablet 4 mg  4 mg Oral Q6H PRN [name], NP   4 mg at 04/07/11 1422  . pantoprazole (PROTONIX) EC tablet 40 mg  40 mg Oral Daily [name], NP   40 mg at 04/07/11 0852  . phenytoin (DILANTIN) ER capsule 300 mg  300 mg Oral QHS [name], NP   300 mg at 04/06/11 2111  . thiamine (VITAMIN B-1) tablet 100 mg   100 mg Oral Daily [name], NP   100 mg at 04/07/11 0852  . DISCONTD: hydrOXYzine (ATARAX/VISTARIL) tablet 25 mg  25 mg Oral Q6H PRN Alyson Kuroski-Mazzei, DO      . DISCONTD: loperamide (IMODIUM) capsule 2-4 mg  2-4 mg Oral PRN Alyson Kuroski-Mazzei, DO      . DISCONTD: ondansetron (ZOFRAN-ODT) disintegrating tablet 4 mg  4 mg Oral Q6H PRN Alyson Kuroski-Mazzei, DO        Lab Results: No results found for this or any previous visit (from the past 48 hour(s)).  Physical Findings: AIMS:  , ,  ,  ,    CIWA:  CIWA-Ar Total: 7  COWS:  COWS Total Score: 5   Treatment Plan Summary: Daily contact with patient to assess and evaluate symptoms and progress in treatment Medication management Will restart his trazodone 75mg  .  CM will continue to look for abstinence based program.   Plan:  See above.  Amed Datta 04/07/2011, 2:39 PM

## 2011-04-07 NOTE — Progress Notes (Signed)
Attempted to arouse to administer midday neurontin. Documented "not given" in [facility].

## 2011-04-07 NOTE — Progress Notes (Signed)
Patient ID: Dean [name], male   DOB: [birth year], 31 y.o.   MRN: [id number] Pt is sleeping in bed for much of the afternoon but he is now awake and active on the unit. Pt denies SI/HI and AVH. Pt states that he has been through detox before and plans to get involved with [facility] after d/c. Pt also states that he hopes to find placement in a long term facility. Pt is attending groups and is cooperative with staff. Writer will continue to monitor.

## 2011-04-07 NOTE — Progress Notes (Signed)
Cosigned by Carney Bern, LCSWA 3/28/20134:43 PM

## 2011-04-07 NOTE — Progress Notes (Signed)
Cosigned by Carney Bern, LCSWA 3/28/20134:41 PM

## 2011-04-07 NOTE — Progress Notes (Signed)
Cosigned by Ciella Obi C Breonna Gafford, LCSWA 3/28/20134:41 PM   

## 2011-04-07 NOTE — Discharge Planning (Signed)
[name] attended AM group, good participation.  Talked to him about my unwillingness to refer him to rehab unless he is fully invested in abstinence only.  Told him this based on statements he had made previously about planning to get back on Suboxone and Benzos post d/c.  I helped him understand it had nothing to do with his plan, but how rehabs are set up as abstinence based models, and it hurts others when residents there are either ambivalent or planning on using controlled substances.  He went on to talk about all the problems that medications/drugs/alcohol have caused him, but yet is not convinced that he is dependent.  Told me he would think about it and give me an answer soon.

## 2011-04-07 NOTE — Progress Notes (Signed)
Awake and requesting medication for nausea. Neurontin adminstered as well.

## 2011-04-08 DIAGNOSIS — R112 Nausea with vomiting, unspecified: Secondary | ICD-10-CM | POA: Diagnosis present

## 2011-04-08 DIAGNOSIS — R197 Diarrhea, unspecified: Secondary | ICD-10-CM | POA: Diagnosis present

## 2011-04-08 DIAGNOSIS — F1994 Other psychoactive substance use, unspecified with psychoactive substance-induced mood disorder: Secondary | ICD-10-CM | POA: Diagnosis present

## 2011-04-08 DIAGNOSIS — F1120 Opioid dependence, uncomplicated: Secondary | ICD-10-CM | POA: Diagnosis present

## 2011-04-08 MED ORDER — LOPERAMIDE HCL 2 MG PO CAPS
2.0000 mg | ORAL_CAPSULE | ORAL | Status: DC | PRN
  Administered 2011-04-08 – 2011-04-09 (×5): 2 mg via ORAL

## 2011-04-08 MED ORDER — ONDANSETRON HCL 4 MG PO TABS: 4.0000 mg | ORAL_TABLET | Freq: Three times a day (TID) | ORAL | Status: DC | PRN

## 2011-04-08 MED ORDER — GABAPENTIN 300 MG PO CAPS
600.0000 mg | ORAL_CAPSULE | ORAL | Status: DC
  Administered 2011-04-08 – 2011-04-14 (×18): 600 mg via ORAL
  Filled 2011-04-08 (×16): qty 2
  Filled 2011-04-08: qty 1
  Filled 2011-04-08 (×7): qty 2

## 2011-04-08 NOTE — Progress Notes (Signed)
Sleeping at long intervals.  No acute withdrawal symptoms reported or observed.  Safety maintained via Q 15 minute checks.Marland Kitchen

## 2011-04-08 NOTE — Progress Notes (Signed)
[facility] Group Notes:  (Counselor/Nursing/MHT/Case Management/Adjunct)  04/08/2011 3:24 PM   Type of Therapy:  Processing Group at 11:00 am  Participation Level:  Minimal  Participation Quality:  Resistant  Affect:  Flat  Cognitive:  Oriented  Insight:  Unknown; none shared  Engagement in Group:  None  Engagement in Therapy:  Unknown  Modes of Intervention:  Education, Exploration and limit setting  Summary of Progress/Problems:  Dean [name] was in and out of group room until given limits; when asked to choose a place and stay there patient left group room and did not return. Total time in group was left than half with multiple interruptions. Pt was quiet, resisted eye contact and Appeared flat.  [facility] Group Notes:  (Counselor/Nursing/MHT/Case Management/Adjunct)  04/08/2011 3:24 PM   Type of Therapy:  Counseling Group at 1:15 pm  Participation Level:  Did not attend  Ronda Fairly, LCSWA 04/08/2011 3:24 PM

## 2011-04-08 NOTE — Progress Notes (Signed)
Subjective:[facility] MD Progress Note  04/08/2011 2:35 PM  Diagnosis: Alcohol Dependence & W/D; Benzodiazepine W/D from Klonopin; Opioid Withdrawal from Suboxone; Mood Disorder NOS; PTSD, per Hx; Cocaine Dependence, in remission    ADL's:  Intact  Sleep: Poor Subjective: Dean [name] was resting in bed today during my rounds.  He complained of feeling sick to his stomach after eating the "pork entre'" during lunch and has since had vomiting and diarrhea.  He has been given Maalox, as the Imodium has expired, along with the zofran. Appetite:  Fair  Suicidal Ideation:  denies Homicidal Ideation:  denies  AEB (as evidenced by):  Mental Status Examination/Evaluation: Objective:  Appearance: Casual  Eye Contact::  Good  Speech:  Normal Rate  Volume:  Normal  Mood:  Euthymic  Affect:  Appropriate  Thought Process:  Coherent  Orientation:  Full  Thought Content:  WDL  Suicidal Thoughts:  No  Homicidal Thoughts:  No  Memory:  Immediate;   Good  Judgement:  Intact  Insight:  Good  Psychomotor Activity:  Normal  Concentration:  Good  Recall:  Good  Akathisia:  No  Handed:    AIMS (if indicated):     Assets:  Communication Skills Desire for Improvement  Sleep:  Number of Hours: 5.5    Vital Signs:Blood pressure 128/88 , pulse 62, temperature 97.7 F (36.5 C), temperature source Oral, resp. rate 17. Current Medications: Current Facility-Administered Medications  Medication Dose Route Frequency Provider Last Rate Last Dose  . alum & mag hydroxide-simeth (MAALOX/MYLANTA) 200-200-20 MG/5ML suspension 30 mL  30 mL Oral Q4H PRN [name], NP      . chlordiazePOXIDE (LIBRIUM) capsule 25 mg  25 mg Oral Q6H PRN [name], NP   25 mg at 04/07/11 0651  . chlordiazePOXIDE (LIBRIUM) capsule 50 mg  50 mg Oral TID Alyson Kuroski-Mazzei, DO   50 mg at 04/06/11 1727   Followed by  . chlordiazePOXIDE (LIBRIUM) capsule 50 mg  50 mg Oral BH-qamhs Alyson Kuroski-Mazzei, DO   50 mg at  04/07/11 0848   Followed by  . chlordiazePOXIDE (LIBRIUM) capsule 50 mg  50 mg Oral Daily Alyson Kuroski-Mazzei, DO      . cloNIDine (CATAPRES) tablet 0.1 mg  0.1 mg Oral QID Alyson Kuroski-Mazzei, DO   0.1 mg at 04/06/11 2111   Followed by  . cloNIDine (CATAPRES) tablet 0.1 mg  0.1 mg Oral BH-qamhs Alyson Kuroski-Mazzei, DO   0.1 mg at 04/07/11 0848   Followed by  . cloNIDine (CATAPRES) tablet 0.1 mg  0.1 mg Oral QAC breakfast Alyson Kuroski-Mazzei, DO      . dicyclomine (BENTYL) tablet 20 mg  20 mg Oral Q4H PRN Alyson Kuroski-Mazzei, DO   20 mg at 04/06/11 0630  . DULoxetine (CYMBALTA) DR capsule 60 mg  60 mg Oral Daily [name], NP   60 mg at 04/07/11 0850  . gabapentin (NEURONTIN) capsule 600 mg  600 mg Oral TID Alyson Kuroski-Mazzei, DO   600 mg at 04/07/11 1422  . hydrOXYzine (ATARAX/VISTARIL) tablet 25 mg  25 mg Oral QHS PRN [name], NP   25 mg at 04/06/11 2220  . ibuprofen (ADVIL,MOTRIN) tablet 600 mg  600 mg Oral Q8H PRN [name], NP   600 mg at 04/05/11 0006  . loperamide (IMODIUM) capsule 2-4 mg  2-4 mg Oral PRN [name], NP   2 mg at 04/06/11 1607  . magnesium hydroxide (MILK OF MAGNESIA) suspension 30 mL  30  mL Oral Daily PRN [name], NP      . methocarbamol (ROBAXIN) tablet 500 mg  500 mg Oral Q8H PRN Alyson Kuroski-Mazzei, DO      . mulitivitamin with minerals tablet 1 tablet  1 tablet Oral Daily [name], NP   1 tablet at 04/07/11 0847  . naproxen (NAPROSYN) tablet 500 mg  500 mg Oral BID PRN Alyson Kuroski-Mazzei, DO   500 mg at 04/06/11 0630  . nicotine polacrilex (NICORETTE) gum 2 mg  2 mg Oral PRN [name], NP   2 mg at 04/05/11 1605  . ondansetron (ZOFRAN-ODT) disintegrating tablet 4 mg  4 mg Oral Q6H PRN [name], NP   4 mg at 04/07/11 1422  . pantoprazole (PROTONIX) EC tablet 40 mg  40 mg Oral Daily [name], NP   40 mg at 04/07/11 0852  . phenytoin (DILANTIN) ER capsule 300 mg  300 mg Oral QHS  [name], NP   300 mg at 04/06/11 2111  . thiamine (VITAMIN B-1) tablet 100 mg  100 mg Oral Daily [name], NP   100 mg at 04/07/11 0852  . DISCONTD: hydrOXYzine (ATARAX/VISTARIL) tablet 25 mg  25 mg Oral Q6H PRN Alyson Kuroski-Mazzei, DO      . DISCONTD: loperamide (IMODIUM) capsule 2-4 mg  2-4 mg Oral PRN Alyson Kuroski-Mazzei, DO      . DISCONTD: ondansetron (ZOFRAN-ODT) disintegrating tablet 4 mg  4 mg Oral Q6H PRN Alyson Kuroski-Mazzei, DO        Lab Results: No results found for this or any previous visit (from the past 48 hour(s)).  Physical Findings: AIMS:  , ,  ,  ,    CIWA:  CIWA-Ar Total: 4  COWS:  COWS Total Score: 2   Treatment Plan Summary: Daily contact with patient to assess and evaluate symptoms and progress in treatment Medication management  Plan:  Will reinstate zofran and imodium due to his recent gastric distress.  Otherwise continue current plan of care.  [name] 04/08/2011, 2:35 PM

## 2011-04-08 NOTE — Discharge Planning (Signed)
Dean [name] attended AM group, good participation.  I s ready to get on with the next step, so gave me green light to call [facility].  I gave them info.  Unfortunately, LME not able to give auth today.  Try again on Monday.

## 2011-04-08 NOTE — Progress Notes (Signed)
Out on unit interacting with peers and attending groups. C/O diarrhea that he feels may be r/t protonix.  Will communicate this to provider. Maalox requested and received.Rates depression and hopelessness at 3. Denies SI. Support offered and 15' checks cont for safety

## 2011-04-09 LAB — COMPREHENSIVE METABOLIC PANEL
ALT: 47 U/L (ref 0–53)
AST: 38 U/L — ABNORMAL HIGH (ref 0–37)
Albumin: 3.7 g/dL (ref 3.5–5.2)
Alkaline Phosphatase: 97 U/L (ref 39–117)
BUN: 10 mg/dL (ref 6–23)
CO2: 25 mEq/L (ref 19–32)
Calcium: 9.4 mg/dL (ref 8.4–10.5)
Chloride: 106 mEq/L (ref 96–112)
Creatinine, Ser: 0.69 mg/dL (ref 0.50–1.35)
GFR calc Af Amer: >90 mL/min (ref >90)
GFR calc non Af Amer: >90 mL/min (ref >90)
Glucose, Bld: 103 mg/dL — ABNORMAL HIGH (ref 70–99)
Potassium: 4.2 mEq/L (ref 3.5–5.1)
Sodium: 140 mEq/L (ref 135–145)
Total Bilirubin: 0.2 mg/dL — ABNORMAL LOW (ref 0.3–1.2)
Total Protein: 7.1 g/dL (ref 6.0–8.3)

## 2011-04-09 LAB — PHENYTOIN LEVEL, TOTAL: Phenytoin Lvl: 4 ug/mL — ABNORMAL LOW (ref 10.0–20.0)

## 2011-04-09 NOTE — Progress Notes (Signed)
Patient ID: Dean [name], male   DOB: 10/10/1980, 32 y.o.   MRN: [id number]   Pt has been very flat and depressed on the unit. Pt has also been very agitated due to frequent loose stools, Mallie Darting PA made aware of situation. Mallie Darting PA reported that patient can have up to 16mg  of imodium daily. Pt has received 6mg  of imodium on day shift today. Pt continues to have severe withdrawal symptoms. Pt reported being negative SI/HI, no AH/VH noted.

## 2011-04-09 NOTE — Progress Notes (Signed)
Patient ID: Dean [name], male   DOB: September 10, 1980, 31 y.o.   MRN: [id number]  [facility] Group Notes:  (Counselor/Nursing/MHT/Case Management/Adjunct)  04/09/2011 1:15 PM  Type of Therapy:  Group Therapy, Dance/Movement Therapy   Participation Level:  Did Not Attend   Rhunette Croft

## 2011-04-09 NOTE — Progress Notes (Signed)
Patient ID: Dean [name], male   DOB: 1980/09/17, 31 y.o.   MRN: [id number] The patient is interacting appropriately in the milieu. He is calm and no signs of anxiety or withdrawal are noted until it is brought to his attention. He shared parts of his life that were traumatic and seemed less anxious afterwards. Encouraged to attend groups and become more involved in participation. He wants to feel better, but is not vested in the treatment.

## 2011-04-09 NOTE — Progress Notes (Signed)
  Dean [name] is a 31 y.o. male [id number] 1980/06/17  04/04/2011 Principal Problem:  *Polysubstance dependence including opioid type drug, continuous use Active Problems:  Post traumatic stress disorder (PTSD)  Nausea and vomiting  Diarrhea  Substance induced mood disorder   Mental Status: Alert & oriented mood is anxious denies active SI/HI/AVH.  Subjective/Objective: Reports diarrhea from being off his Klonopin. Says he is only here for alcohol withdrawal. Says he has taken Benzoes for 15 years.    Filed Vitals:   04/09/11 0701  BP: 145/90  Pulse: 73  Temp:   Resp: 16    Lab Results:   BMET    Component Value Date/Time   NA 137 04/03/2011 1013   K 3.9 04/03/2011 1013   CL 98 04/03/2011 1013   CO2 27 04/03/2011 1013   GLUCOSE 112* 04/03/2011 1013   BUN 3* 04/03/2011 1013   CREATININE 0.55 04/03/2011 1013   CALCIUM 9.4 04/03/2011 1013   GFRNONAA >90 04/03/2011 1013   GFRAA >90 04/03/2011 1013    Medications:  Scheduled:     . cloNIDine  0.1 mg Oral BH-qamhs   Followed by  . cloNIDine  0.1 mg Oral QAC breakfast  . DULoxetine  60 mg Oral Daily  . gabapentin  600 mg Oral [id number]  . mulitivitamin with minerals  1 tablet Oral Daily  . pantoprazole  40 mg Oral Daily  . phenytoin  300 mg Oral QHS  . thiamine  100 mg Oral Daily  . traZODone  75 mg Oral QHS  . DISCONTD: gabapentin  600 mg Oral TID     PRN Meds alum & mag hydroxide-simeth, dicyclomine, hydrOXYzine, ibuprofen, loperamide, magnesium hydroxide, methocarbamol, naproxen, nicotine polacrilex, ondansetron  Plan: Immodium ordered for diarrhea.          Continue current plan of care.   Amalee Olsen,MICKIE D. 04/09/2011

## 2011-04-09 NOTE — Progress Notes (Signed)
Patient ID: Dean [name], male   DOB: [birth year], 31 y.o.   MRN: [id number] The patient attended the evening substance abuse group. States he is stlil feeling anxious and reports feeling achy. Tremors noted.

## 2011-04-10 MED ORDER — CLONIDINE HCL 0.1 MG/24HR TD PTWK
0.1000 mg | MEDICATED_PATCH | TRANSDERMAL | Status: DC
  Administered 2011-04-10: 0.1 mg via TRANSDERMAL
  Filled 2011-04-10 (×2): qty 1

## 2011-04-10 MED ORDER — HYDROXYZINE HCL 25 MG PO TABS
25.0000 mg | ORAL_TABLET | Freq: Four times a day (QID) | ORAL | Status: DC | PRN
  Administered 2011-04-10 – 2011-04-12 (×5): 25 mg via ORAL
  Filled 2011-04-10 (×2): qty 1

## 2011-04-10 NOTE — Progress Notes (Signed)
Pt is irritable this am and is refusing groups.  Rated depression and hopelessness at 0 and still is c/o agitatio r/t withdrawals.  No physical complaints. C/O poor sleep and improving appetite.  Provider ordering antianxiety medication and will inform pt. Cont to support and cont 15' checks for safety.

## 2011-04-10 NOTE — Progress Notes (Signed)
Pt states that he can "feel his heart beat in his head" and inquired if his increased BP was dangerous. Reassured pt and applied clonidine patch per provider order and med education given. Pt verbalized understanding.

## 2011-04-10 NOTE — Progress Notes (Signed)
[facility] Group Notes:  (Counselor/Nursing/MHT/Case Management/Adjunct)  04/10/2011 1:15 PM  Type of Therapy:  Group Therapy, Dance/Movement Therapy   Participation Level:  Active  Participation Quality:  Appropriate, sharing, suportive  Affect:  Appropriate and Depressed  Cognitive:  Oriented  Insight:  Good  Engagement in Group:  Limited  Engagement in Therapy:  Good  Modes of Intervention:  Clarification, Problem-solving, Role-play, Socialization and Support  Summary of Progress/Problems: Pt. began group by talking about what memories the Easter holiday brought up, "being drunk" and that it is "depressing". Pt stated that he wants to be sober next Easter. Pt spoke about supports and how he wished he had more true supports. Pt actively discussed what makes a positive healthy support and how to determine if people are true supports of not.    Dean [name]

## 2011-04-10 NOTE — Progress Notes (Signed)
  Dean [name] is a 31 y.o. male [id number] October 06, 1980  04/04/2011 Principal Problem:  *Polysubstance dependence including opioid type drug, continuous use Active Problems:  Post traumatic stress disorder (PTSD)  Nausea and vomiting  Diarrhea  Substance induced mood disorder   Mental Status: Alert & oriented mood is allright and is not suicidal homicidal or psychotic.    Subjective/Objective: Feels he is here for alcohol detox only and wants to continue his Klonopin. Informed me of his elevated BP today.Informed him that the Librium replaced the Klonopin and he could resume Klonopin once discharged if that is his choice.     Filed Vitals:   04/10/11 0627  BP: 150/103  Pulse:   Temp:   Resp:     Lab Results:   BMET    Component Value Date/Time   NA 140 04/09/2011 1938   K 4.2 04/09/2011 1938   CL 106 04/09/2011 1938   CO2 25 04/09/2011 1938   GLUCOSE 103* 04/09/2011 1938   BUN 10 04/09/2011 1938   CREATININE 0.69 04/09/2011 1938   CALCIUM 9.4 04/09/2011 1938   GFRNONAA >90 04/09/2011 1938   GFRAA >90 04/09/2011 1938    Medications:  Scheduled:     . cloNIDine  0.1 mg Oral QAC breakfast  . DULoxetine  60 mg Oral Daily  . gabapentin  600 mg Oral [id number]  . mulitivitamin with minerals  1 tablet Oral Daily  . pantoprazole  40 mg Oral Daily  . phenytoin  300 mg Oral QHS  . thiamine  100 mg Oral Daily  . traZODone  75 mg Oral QHS     PRN Meds alum & mag hydroxide-simeth, dicyclomine, hydrOXYzine, ibuprofen, loperamide, magnesium hydroxide, methocarbamol, naproxen, nicotine polacrilex, ondansetron Plan: continue current plan of care.  Dean [name],Dean D. 04/10/2011

## 2011-04-10 NOTE — Progress Notes (Signed)
Patient ID: Dean [name], male   DOB: 02-12-1980, 31 y.o.   MRN: [id number] Patient denies any complications from detox. Patient did request vistaril to be given with HS medications for anxiety. Patient was given clonidine patch that was placed by previous shift for high blood pressure. Patient states that high blood pressure runs in his family. Patient denies SI/HI/AV. No pain voiced or observed. No behavioral issues noted so far this shift. Patient has been in dayroom for group and to watch movie this evening. Patient appears calm and cooperative. Will continue monitoring patients q15 minutes to ensure patient safety and will continue patient's plan of care. Patient remains safe while on the unit.

## 2011-04-11 MED ORDER — LISINOPRIL 10 MG PO TABS
5.0000 mg | ORAL_TABLET | Freq: Once | ORAL | Status: AC
  Administered 2011-04-11: 5 mg via ORAL
  Filled 2011-04-11: qty 1

## 2011-04-11 MED ORDER — LISINOPRIL 5 MG PO TABS
5.0000 mg | ORAL_TABLET | Freq: Every day | ORAL | Status: DC
  Administered 2011-04-12 – 2011-04-14 (×3): 5 mg via ORAL
  Filled 2011-04-11: qty 1
  Filled 2011-04-11 (×2): qty 14
  Filled 2011-04-11 (×2): qty 1

## 2011-04-11 MED ORDER — CLONIDINE HCL 0.1 MG PO TABS
0.0500 mg | ORAL_TABLET | Freq: Once | ORAL | Status: AC
  Administered 2011-04-11: 0.05 mg via ORAL
  Filled 2011-04-11: qty 1
  Filled 2011-04-11: qty 0.5

## 2011-04-11 NOTE — Discharge Planning (Signed)
Patient seen briefly to inquire whether he has any case management needs today, which he denies.  Per State Regulation 482.30  This chart was reviewed for medical necessity with respect to the patient's Admission/Duration of stay.   Next review due:  04/14/11   Ambrose Mantle, LCSW  04/11/2011  12:17 PM

## 2011-04-11 NOTE — Progress Notes (Signed)
Pt observed in the dayroom watching TV.  He reports his day has been ok.  He denies withdrawal symptoms at this time.  He has a slight headache, but nothing major.  He denies SI/HI at this time.  He says he may discharge to [facility] or [facility], but he doesn't know when.  His BP has been elevated for the past few days.  He is to start Lisinopril 5mg  daily in the AM.  Will continue to monitor BP with a check tonight before bed.  Safety maintained with q15 minute checks.

## 2011-04-11 NOTE — Progress Notes (Signed)
B/P and Pulse  Sitting = 125/87 and 87; Standing = 117/86 and 93

## 2011-04-11 NOTE — Progress Notes (Signed)
Sitting B/P and pulse = 133/75 and 63;  Standing+ 136/91 and 80.  Currently asymptomatic.  Will recheck  In one hour.

## 2011-04-11 NOTE — Progress Notes (Signed)
Pt has been up and has been active while in the milieu today, pt has spoken about still feeling some anxiety, having difficulty sleeping and is concerned about his blood pressure, pt has received medications today without incident, will continue to monitor

## 2011-04-11 NOTE — Progress Notes (Addendum)
Patient complains of headache, bilateral temporal areas; dizziness; and "spots" before eyes.  Blood Pressure and pulse : Sitting=154/110, 62  And Standing= 170/114, 68.  Dr. Theotis Barrio notified.  Patient instructed to remain an bedrest.

## 2011-04-11 NOTE — Progress Notes (Signed)
Blood pressure , lying = 131/86 and  Pulse =53.  Back to sleep immediately after having being awakened for VS check.Dean [name]

## 2011-04-12 DIAGNOSIS — R569 Unspecified convulsions: Secondary | ICD-10-CM | POA: Diagnosis present

## 2011-04-12 LAB — COMPREHENSIVE METABOLIC PANEL
ALT: 62 U/L — ABNORMAL HIGH (ref 0–53)
AST: 43 U/L — ABNORMAL HIGH (ref 0–37)
Albumin: 3.7 g/dL (ref 3.5–5.2)
Alkaline Phosphatase: 95 U/L (ref 39–117)
BUN: 9 mg/dL (ref 6–23)
CO2: 23 mEq/L (ref 19–32)
Calcium: 9.5 mg/dL (ref 8.4–10.5)
Chloride: 103 mEq/L (ref 96–112)
Creatinine, Ser: 0.56 mg/dL (ref 0.50–1.35)
GFR calc Af Amer: >90 mL/min (ref >90)
GFR calc non Af Amer: >90 mL/min (ref >90)
Glucose, Bld: 96 mg/dL (ref 70–99)
Potassium: 3.7 mEq/L (ref 3.5–5.1)
Sodium: 137 mEq/L (ref 135–145)
Total Bilirubin: 0.3 mg/dL (ref 0.3–1.2)
Total Protein: 7.3 g/dL (ref 6.0–8.3)

## 2011-04-12 MED ORDER — SODIUM CHLORIDE 0.9 % IV SOLN: 1000.0000 mg | Freq: Two times a day (BID) | INTRAVENOUS | Status: DC

## 2011-04-12 MED ORDER — LEVETIRACETAM 500 MG PO TABS
500.0000 mg | ORAL_TABLET | Freq: Two times a day (BID) | ORAL | Status: DC
  Administered 2011-04-12 – 2011-04-14 (×5): 500 mg via ORAL
  Filled 2011-04-12: qty 1
  Filled 2011-04-12 (×2): qty 28
  Filled 2011-04-12: qty 1
  Filled 2011-04-12 (×2): qty 28
  Filled 2011-04-12 (×2): qty 1

## 2011-04-12 MED ORDER — LEVETIRACETAM 500 MG PO TABS
1000.0000 mg | ORAL_TABLET | ORAL | Status: AC
  Administered 2011-04-12: 1000 mg via ORAL
  Filled 2011-04-12: qty 2

## 2011-04-12 MED ORDER — HYDROXYZINE HCL 50 MG PO TABS
50.0000 mg | ORAL_TABLET | Freq: Four times a day (QID) | ORAL | Status: DC | PRN
  Administered 2011-04-12 – 2011-04-14 (×3): 50 mg via ORAL
  Filled 2011-04-12: qty 1

## 2011-04-12 NOTE — Progress Notes (Signed)
[facility] Group Notes:  (Counselor/Nursing/MHT/Case Management/Adjunct)  04/12/2011 2:44 PM   Type of Therapy:  Processing Group at 11:00 am  Participation Level:  Did Not attend more than 5 minutes of group   [facility] Group Notes:  (Counselor/Nursing/MHT/Case Management/Adjunct)  04/12/2011 2:44 PM   Type of Therapy:  Counseling Group at 1:15 pm  Participation Level:  Did Not Attend  Ronda Fairly, LCSWA 04/12/2011 2:44 PM

## 2011-04-12 NOTE — Progress Notes (Signed)
[facility] Group Notes:  (Counselor/Nursing/MHT/Case Management/Adjunct)   2:02 PM   Type of Therapy:  Processing Group at 11:00 am on Monday 04/11/11  Participation Level:  Minimal  Participation Quality:  Resistant  Affect:  Blunted  Cognitive:  Oriented  Insight:  None shared  Engagement in Group:  None  Engagement in Therapy:  None  Modes of Intervention:  Exploration and support  Summary of Progress/Problems:  Dean [name] Dean [name]) was resistant to sharing his feelings about diagnosis and ultimately left group early. He later shared with writer that he was dealing with a headache.    [facility] Group Notes:  (Counselor/Nursing/MHT/Case Management/Adjunct)  04/12/2011 2:02 PM   Type of Therapy:  Counseling Group at 1:15 pm on Monday 04/11/11  Participation Level:  Did Not Attend  Ronda Fairly, LCSWA 04/12/2011 2:02 PM

## 2011-04-12 NOTE — Progress Notes (Signed)
[facility] MD Progress Note  04/12/2011 3:23 PM  Diagnosis:  Alcohol dependence, hx of opiate addiction  ADL's:  Intact  Sleep: Fair  Appetite:  Good  Suicidal Ideation:  Denies Homicidal Ideation:  denies  Subjective: Dean [name] is doing well, but unfortunately he reports having a seizure last night.  He was observed in the hall on the floor, surrounded by patients, in the nursing notes for last night.  He was given a dose of the lisinopril last night and his blood pressures are much improved.  Mental Status Examination/Evaluation: Objective:  Appearance: Casual  Eye Contact::  Good  Speech:  Clear and Coherent  Volume:  Normal  Mood:  Euthymic  Affect:  Congruent  Thought Process:  Intact  Orientation:  Full  Thought Content:  WDL  Suicidal Thoughts:  No  Homicidal Thoughts:  No  Memory:  Immediate;   Good  Judgement:  Intact  Insight:  Present  Psychomotor Activity:  Normal  Concentration:  Good  Recall:  Good  Akathisia:  No  Handed:    AIMS (if indicated):     Assets:  Desire for Improvement  Sleep:  Number of Hours: 6    Vital Signs:Blood pressure 124/90, pulse 81, temperature 97.8 F (36.6 C), temperature source Oral, resp. rate 18, SpO2 96.00%. Objective: Pt. Is alert and oriented, states he feels better now that his blood pressure is down.  He denies any problems related to the seizure.  He is eating well and sleeping well.  He denies any post ictal fatigue or incontinence. His mood is calm and his affect is congruent.  He tells me he hasn't seen a neurologist in years, and records are reviewed and support this.   Current Medications: Current Facility-Administered Medications  Medication Dose Route Frequency Provider Last Rate Last Dose  . alum & mag hydroxide-simeth (MAALOX/MYLANTA) 200-200-20 MG/5ML suspension 30 mL  30 mL Oral Q4H PRN Dean Spare, NP   30 mL at 04/08/11 1152  . cloNIDine (CATAPRES - Dosed in mg/24 hr) patch 0.1 mg  0.1 mg Transdermal Weekly  Dean D. Adams, PA   0.1 mg at 04/10/11 1430  . DULoxetine (CYMBALTA) DR capsule 60 mg  60 mg Oral Daily Dean Labrum Readling, MD   60 mg at 04/12/11 0820  . gabapentin (NEURONTIN) capsule 600 mg  600 mg Oral [id number] Dean Labrum Readling, MD   600 mg at 04/12/11 1402  . hydrOXYzine (ATARAX/VISTARIL) tablet 50 mg  50 mg Oral Q6H PRN Dean [name]      . ibuprofen (ADVIL,MOTRIN) tablet 600 mg  600 mg Oral Q8H PRN Dean Spare, NP   600 mg at 04/11/11 2117  . levETIRAcetam (KEPPRA) tablet 500 mg  500 mg Oral BID Dean [name]      . lisinopril (PRINIVIL,ZESTRIL) tablet 5 mg  5 mg Oral Daily Dean [name]   5 mg at 04/12/11 0820  . lisinopril (PRINIVIL,ZESTRIL) tablet 5 mg  5 mg Oral Once Dean Skinner, MD   5 mg at 04/11/11 2229  . loperamide (IMODIUM) capsule 2 mg  2 mg Oral PRN Dean [name]   2 mg at 04/09/11 1156  . magnesium hydroxide (MILK OF MAGNESIA) suspension 30 mL  30 mL Oral Daily PRN Dean Spare, NP      . mulitivitamin with minerals tablet 1 tablet  1 tablet Oral Daily Dean Bacon, MD   1 tablet at 04/12/11 0820  . nicotine polacrilex (NICORETTE) gum 2 mg  2 mg Oral PRN Dean Spare, NP   2 mg at 04/11/11 1814  . ondansetron (ZOFRAN) tablet 4 mg  4 mg Oral Q8H PRN Dean [name]      . thiamine (VITAMIN B-1) tablet 100 mg  100 mg Oral Daily Dean Labrum Readling, MD   100 mg at 04/12/11 0820  . DISCONTD: hydrOXYzine (ATARAX/VISTARIL) tablet 25 mg  25 mg Oral Q6H PRN Dean D. Adams, PA   25 mg at 04/12/11 1105  . DISCONTD: levETIRAcetam (KEPPRA) 1,000 mg in sodium chloride 0.9 % 100 mL IVPB  1,000 mg Intravenous Q12H Dean [name]      . DISCONTD: phenytoin (DILANTIN) ER capsule 300 mg  300 mg Oral QHS Dean Labrum Readling, MD   300 mg at 04/11/11 2117  . DISCONTD: traZODone (DESYREL) tablet 75 mg  75 mg Oral QHS Dean Bacon, MD   75 mg at 04/11/11 2118    Lab Results: No results found for this or any previous visit (from the past 48  hour(s)).  Physical Findings: AIMS:  , ,  ,  ,    CIWA:  CIWA-Ar Total: 2  COWS:  COWS Total Score: 5   Treatment Plan Summary: Consulted Dr. Lyman [name] with neurology.  He recommended stopping dilantin, starting Keppra due to a better side effect profile. Continue the other medications as written. Will continue to monitor Dean [name] for any sequela related to his seizure.  He should be stable for discharge in 24 to 48 hours barring any new events.  Plan: See above.  Dean [name] 04/12/2011, 3:23 PM

## 2011-04-12 NOTE — Progress Notes (Signed)
Pt. Has been in bed intermittently this evening. Reports that he was having some weakness but that it is going away and that he is feeling better.  Pt. Education information was given to to pt.  On the medication Keppra.  Pt. Appreciative. Support given.

## 2011-04-12 NOTE — Progress Notes (Signed)
Around 2300, staff heard a commotion in the hallway.  This pt was lying in the floor outside the dayroom, and appeared to be having a seizure.  Staff got a pillow for his head and kept pt safe until he became aware of his surroundings.  Pt was a little agitated and not able to stay still for vital signs at first, but eventually staff was able to get a reading.  Pt's BP was still elevated along with his pulse.  Results are documented.  Pt stated he was experiencing a little nausea also.  Pt was able to state he was at [facility].  Pt was assisted to his room to bed.  Pt was observed for 15 minutes then another set of vitals were taken.  Pt's BP and pulse had come down to WDL.  MD on call was notified.  No changes to meds were made, but MD ordered if pt had another episode to send him to the ED.  At this time pt is in his bed and appears to be asleep.  Safety checks continue at q15 minutes.

## 2011-04-12 NOTE — Progress Notes (Signed)
Patient ID: Dean [name], male   DOB: [birth year], 31 y.o.   MRN: [id number] Pt has been up for one group then back to bed. Poor interaction with peers. Affect flat sad.  Wanting to know when he would be leaving that he was tired of staying here. Explained to him that  Medication was being adjusted because of his having a seizure last pm.  He requested and received prn vistaril for anxiety. He then went back to room.

## 2011-04-12 NOTE — Progress Notes (Signed)
Dean Dean [name] Progress Note  04/11/2011  3:19 PM  Diagnosis:    ADL's:  Intact  Sleep: Fair  Appetite:  Good  Suicidal Ideation:  denies Homicidal Ideation:  demoes  Subjective: Pt. Stated in morning group that he was interested in going into residential rehab and was much more motivated to succeed than he had been in the past.  Mental Status Examination/Evaluation: Objective:  Appearance: Casual  Eye Contact::  Good  Speech:  Clear and Coherent  Volume:  Normal  Mood:  Euthymic  Affect:  Appropriate  Thought Process:  Linear  Orientation:  Full  Thought Content:  WDL  Suicidal Thoughts:  No  Homicidal Thoughts:  No  Memory:  Immediate;   Good  Judgement:  Good  Insight:  Present  Psychomotor Activity:  Normal  Concentration:  Good  Recall:  Good  Akathisia:  No  Handed:    AIMS (if indicated):     Assets:  Desire for Improvement  Sleep:  Number of Hours: 6    Vital Signs:Blood pressure 124/90, pulse 81, temperature 97.8 F (36.6 C), temperature source Oral, resp. rate 18, SpO2 96.00%. Current Medications: Current Facility-Administered Medications  Medication Dose Route Frequency Provider Last Rate Last Dose  . alum & mag hydroxide-simeth (MAALOX/MYLANTA) 200-200-20 MG/5ML suspension 30 mL  30 mL Oral Q4H PRN Dean Spare, NP   30 mL at 04/08/11 1152  . cloNIDine (CATAPRES - Dosed in mg/24 hr) patch 0.1 mg  0.1 mg Transdermal Weekly Dean D. Adams, PA   0.1 mg at 04/10/11 1430  . DULoxetine (CYMBALTA) DR capsule 60 mg  60 mg Oral Daily Dean Dean [name], Dean [name]   60 mg at 04/12/11 0820  . gabapentin (NEURONTIN) capsule 600 mg  600 mg Oral [id number] Dean Dean [name], Dean [name]   600 mg at 04/12/11 1402  . hydrOXYzine (ATARAX/VISTARIL) tablet 50 mg  50 mg Oral Q6H PRN Dean Spurr, Dean [name]      . ibuprofen (ADVIL,MOTRIN) tablet 600 mg  600 mg Oral Q8H PRN Dean Spare, NP   600 mg at 04/11/11 2117  . levETIRAcetam (KEPPRA) 1,000 mg in sodium chloride 0.9 % 100 mL IVPB  1,000  mg Intravenous Q12H Dean Spurr, Dean [name]      . levETIRAcetam (KEPPRA) tablet 500 mg  500 mg Oral BID Dean Spurr, Dean [name]      . lisinopril (PRINIVIL,ZESTRIL) tablet 5 mg  5 mg Oral Daily Dean Spurr, Dean [name]   5 mg at 04/12/11 0820  . lisinopril (PRINIVIL,ZESTRIL) tablet 5 mg  5 mg Oral Once Dean Dean [name], Dean [name]   5 mg at 04/11/11 2229  . loperamide (IMODIUM) capsule 2 mg  2 mg Oral PRN Dean Spurr, Dean [name]   2 mg at 04/09/11 1156  . magnesium hydroxide (MILK OF MAGNESIA) suspension 30 mL  30 mL Oral Daily PRN Dean Spare, NP      . mulitivitamin with minerals tablet 1 tablet  1 tablet Oral Daily Dean Dean [name], Dean [name]   1 tablet at 04/12/11 0820  . nicotine polacrilex (NICORETTE) gum 2 mg  2 mg Oral PRN Dean Spare, NP   2 mg at 04/11/11 1814  . ondansetron (ZOFRAN) tablet 4 mg  4 mg Oral Q8H PRN Dean Spurr, Dean [name]      . thiamine (VITAMIN B-1) tablet 100 mg  100 mg Oral Daily Dean Dean [name], Dean [name]   100 mg at 04/12/11 0820  . DISCONTD: hydrOXYzine (ATARAX/VISTARIL) tablet 25 mg  25 mg Oral Q6H  PRN Dean D. Adams, PA   25 mg at 04/12/11 1105  . DISCONTD: phenytoin (DILANTIN) ER capsule 300 mg  300 mg Oral QHS Dean Dean [name], Dean [name]   300 mg at 04/11/11 2117  . DISCONTD: traZODone (DESYREL) tablet 75 mg  75 mg Oral QHS Dean Dean [name], Dean [name]   75 mg at 04/11/11 2118    Lab Results: No results found for this or any previous visit (from the past 48 hour(s)).  Physical Findings: AIMS:  , ,  ,  ,    CIWA:  CIWA-Ar Total: 2  COWS:  COWS Total Score: 5   Treatment Plan Summary: Daily contact with patient to assess and evaluate symptoms and progress in treatment Medication management  Plan: Will initiate Lisionopril 5mg  for hypertension.  Dean Dean [name] 04/12/2011, 3:19 PM

## 2011-04-12 NOTE — Discharge Planning (Signed)
Dean [name] was accepted at [facility] today before I found out he had a seizure last night.  Called back, and they rescinded bed offer.  Will reconsider when Dilantin level is at therapeutic level and he has been symptom free.  In the meantime,  Dean [name] is disappointed but has proposed a plan B as he is tired of being locked away with [place].  Plans to return home and follow up with meetings.  Furthermore, he will get himself into [facility] [place]] if he feels like he needs more structure.

## 2011-04-12 NOTE — Progress Notes (Signed)
Pt came to the nurse's station around 2105 c/o headache and "seeing spots" .  BP was elevated.  Evening meds were given at that time along with Ibuprofen for the headache.  Pt was encouraged to go to bed, which he declined to do.  MD on call was notified, and order received to go ahead and give a dose of the Lisinopril 5mg  tonight and put in a consult for the hospitalist in the AM.  Med given, but pt still insists on staying up for awhile.  Safety maintained with q15 minute checks.

## 2011-04-12 NOTE — Progress Notes (Signed)
Dean [name] was given schedule of support groups offered at [facility] which specifically include support for PTSD as per requested by patient as he feels PTSD symptoms are one of his triggers for substance use. [name] 04/12/2011 1:10 PM

## 2011-04-12 NOTE — Progress Notes (Signed)
Recreation Therapy Group Note  Date: 04/12/2011        Time: 1000       Group Topic/Focus: Patient invited to participate in animal assisted therapy. Pets as a coping skill and responsibility were discussed.   Participation Level: Active  Participation Quality: Appropriate and Attentive  Affect: Appropriate  Cognitive: Appropriate and Oriented   Additional Comments: None 

## 2011-04-13 LAB — GLUCOSE, CAPILLARY: Glucose-Capillary: 104 mg/dL — ABNORMAL HIGH (ref 70–99)

## 2011-04-13 MED ORDER — PANTOPRAZOLE SODIUM 40 MG PO TBEC
40.0000 mg | DELAYED_RELEASE_TABLET | Freq: Every day | ORAL | Status: DC
  Filled 2011-04-13: qty 14

## 2011-04-13 MED ORDER — GABAPENTIN 600 MG PO TABS
600.0000 mg | ORAL_TABLET | Freq: Three times a day (TID) | ORAL | Status: DC
  Filled 2011-04-13 (×3): qty 42

## 2011-04-13 MED ORDER — PANTOPRAZOLE SODIUM 40 MG PO TBEC: 40.0000 mg | DELAYED_RELEASE_TABLET | Freq: Every day | ORAL | Status: DC

## 2011-04-13 MED ORDER — GABAPENTIN 300 MG PO CAPS: 600.0000 mg | ORAL_CAPSULE | ORAL | Status: DC

## 2011-04-13 MED ORDER — LISINOPRIL 5 MG PO TABS: 5.0000 mg | ORAL_TABLET | Freq: Every day | ORAL | Status: DC

## 2011-04-13 MED ORDER — LEVETIRACETAM 500 MG PO TABS: 500.0000 mg | ORAL_TABLET | Freq: Two times a day (BID) | ORAL | Status: DC

## 2011-04-13 MED ORDER — CLONIDINE HCL 0.1 MG/24HR TD PTWK: 1.0000 | MEDICATED_PATCH | TRANSDERMAL | Status: DC

## 2011-04-13 MED ORDER — DULOXETINE HCL 60 MG PO CPEP: 60.0000 mg | ORAL_CAPSULE | Freq: Every day | ORAL | Status: DC
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
## 2011-04-13 NOTE — Progress Notes (Signed)
[facility] Group Notes:  (Counselor/Nursing/MHT/Case Management/Adjunct)  04/13/2011 4:12 PM  Type of Therapy:  1:15PM Group Therapy  Participation Level:  Active  Participation Quality:  Appropriate, Attentive and Sharing  Affect:  Appropriate  Cognitive:  Alert and Appropriate  Insight:  Good  Engagement in Group:  Good  Engagement in Therapy:  Good  Modes of Intervention:  Clarification, Education, Support and Exploration  Summary of Progress/Problems: Patient stated his family does not understand his illness. Patient reports no matter what he does in his life, his family always looks at him as if he is a failure. Patient feels like nothing he ever does is good enough for his family. 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 Patient stated the emotions he often experiences are feelings of shame and regret. Patient also described himself as being "broken".   Dean [name] 04/13/2011, 4:12 PM

## 2011-04-13 NOTE — Progress Notes (Signed)
Patient ID: Dean [name], male   DOB: Oct 17, 1980, 31 y.o.   MRN: [id number] -He has been up and about today.  He appears   More relaxed and less tense today. Has had no c/o discomfort. Self inventory depression 0, hopelessness 0, denies SI thoughts.

## 2011-04-13 NOTE — Progress Notes (Signed)
[facility] MD Progress Note  04/13/2011 11:13 AM  Diagnosis:    ADL's:  Intact  Sleep: Good  Appetite:  Good  Suicidal Ideation:  No denies Homicidal Ideation:  No denies  Subjective: Dean [name] states he did well last night slept well and feels much better this morning.  He notes no side effects to the medication changes.  He is disappointed that the CM feels that he won't be accepted to [facility] due to his recent seizure.  He hopes that we could intervene with [facility] to clarify the situation to see if he could still get in.  He remains motivated to live a clean life.  Mental Status Examination/Evaluation: Objective:  Appearance: Neat  Eye Contact::  Good  Speech:  Clear and Coherent  Volume:  Normal  Mood:  Anxious  Affect:  Congruent  Thought Process:  Intact  Orientation:  Full  Thought Content:  WDL  Suicidal Thoughts:  No  Homicidal Thoughts:  No  Memory:  Immediate;   Good  Judgement:  Good  Insight:  Good  Psychomotor Activity:  Normal  Concentration:  Good  Recall:  Good  Akathisia:  No  Handed:    AIMS (if indicated):     Assets:  Desire for Improvement  Sleep:  Number of Hours: 6.5    Vital Signs:Blood pressure 134/87, pulse 83, temperature 98.4 F (36.9 C), temperature source Oral, resp. rate 24, SpO2 96.00%. Current Medications: Current Facility-Administered Medications  Medication Dose Route Frequency Provider Last Rate Last Dose  . alum & mag hydroxide-simeth (MAALOX/MYLANTA) 200-200-20 MG/5ML suspension 30 mL  30 mL Oral Q4H PRN [name], NP   30 mL at 04/08/11 1152  . cloNIDine (CATAPRES - Dosed in mg/24 hr) patch 0.1 mg  0.1 mg Transdermal Weekly [name], PA   0.1 mg at 04/10/11 1430  . DULoxetine (CYMBALTA) DR capsule 60 mg  60 mg Oral Daily [name], MD   60 mg at 04/12/11 0820  . gabapentin (NEURONTIN) capsule 600 mg  600 mg Oral [id number] [name], MD   600 mg at 04/13/11 0809  . hydrOXYzine (ATARAX/VISTARIL) tablet 50 mg  50 mg  Oral Q6H PRN [name], PA-C   50 mg at 04/12/11 2201  . ibuprofen (ADVIL,MOTRIN) tablet 600 mg  600 mg Oral Q8H PRN [name], NP   600 mg at 04/11/11 2117  . levETIRAcetam (KEPPRA) tablet 1,000 mg  1,000 mg Oral NOW [name], PA-C   1,000 mg at 04/12/11 1549  . levETIRAcetam (KEPPRA) tablet 500 mg  500 mg Oral BID [name], PA-C   500 mg at 04/13/11 0809  . lisinopril (PRINIVIL,ZESTRIL) tablet 5 mg  5 mg Oral Daily [name], PA-C   5 mg at 04/13/11 0810  . loperamide (IMODIUM) capsule 2 mg  2 mg Oral PRN [name], PA-C   2 mg at 04/09/11 1156  . magnesium hydroxide (MILK OF MAGNESIA) suspension 30 mL  30 mL Oral Daily PRN [name], NP      . mulitivitamin with minerals tablet 1 tablet  1 tablet Oral Daily [name], MD   1 tablet at 04/13/11 0809  . nicotine polacrilex (NICORETTE) gum 2 mg  2 mg Oral PRN [name], NP   2 mg at 04/11/11 1814  . ondansetron (ZOFRAN) tablet 4 mg  4 mg Oral Q8H PRN [name], PA-C      . thiamine (VITAMIN B-1) tablet 100 mg  100 mg Oral Daily [name], MD   100 mg at 04/13/11 0810  . DISCONTD: hydrOXYzine (ATARAX/VISTARIL) tablet 25 mg  25 mg Oral Q6H PRN [name], PA   25 mg at 04/12/11 1105  . DISCONTD: levETIRAcetam (KEPPRA) 1,000 mg in sodium chloride 0.9 % 100 mL IVPB  1,000 mg Intravenous Q12H [name], PA-C      . DISCONTD: phenytoin (DILANTIN) ER capsule 300 mg  300 mg Oral QHS [name], MD   300 mg at 04/11/11 2117  . DISCONTD: traZODone (DESYREL) tablet 75 mg  75 mg Oral QHS [name], MD   75 mg at 04/11/11 2118    Lab Results:  Results for orders placed during the hospital encounter of 04/04/11 (from the past 48 hour(s))  COMPREHENSIVE METABOLIC PANEL     Status: Abnormal   Collection Time   04/12/11  7:44 PM      Component Value Range Comment   Sodium 137  135 - 145 (mEq/L)    Potassium 3.7  3.5 - 5.1 (mEq/L)    Chloride 103  96 - 112 (mEq/L)    CO2 23  19  - 32 (mEq/L)    Glucose, Bld 96  70 - 99 (mg/dL)    BUN 9  6 - 23 (mg/dL)    Creatinine, Ser 1.61  0.50 - 1.35 (mg/dL)    Calcium 9.5  8.4 - 10.5 (mg/dL)    Total Protein 7.3  6.0 - 8.3 (g/dL)    Albumin 3.7  3.5 - 5.2 (g/dL)    AST 43 (*) 0 - 37 (U/L)    ALT 62 (*) 0 - 53 (U/L)    Alkaline Phosphatase 95  39 - 117 (U/L)    Total Bilirubin 0.3  0.3 - 1.2 (mg/dL)    GFR calc non Af Amer >90  >90 (mL/min)    GFR calc Af Amer >90  >90 (mL/min)     Physical Findings: AIMS:  , ,  ,  ,    CIWA:  CIWA-Ar Total: 2  COWS:  COWS Total Score: 5   Treatment Plan Summary: I will contact [facility] to clarify the recent seizure was due to being off his dilantin while drinking.  He is now on Keppra with no side effects. After the phone consult with neurologist yesterday, Dean [name] is able to continue on with residential rehab which in effect will be much more therapeutic for him.  His motivation is high and Dean [name] wants to capitalize on this to his advantage.  Plan: I did speak with Vernona Rieger at [facility] who is requesting updated information regarding the medication changes and vitals.  The CM will fax this information to [facility] ASAP.  Edlyn Rosenburg 04/13/2011, 11:13 AM

## 2011-04-13 NOTE — Progress Notes (Signed)
[facility] Group Notes:  (Counselor/Nursing/MHT/Case Management/Adjunct)  04/13/2011    Type of Therapy:  Processing Group at 11:00 am on 04/13/2010  Participation Level:  Did Not Attend    Ronda Fairly, [facility] 04/13/2011

## 2011-04-13 NOTE — Treatment Plan (Signed)
Interdisciplinary Treatment Plan Update (Adult)  Date: 04/13/2011  Time Reviewed: 10:15 AM   Progress in Treatment: Attending groups: Yes Participating in groups: Yes Taking medication as prescribed: Yes Tolerating medication: Yes   Family/Significant other contact made:   Patient understands diagnosis:  Yes   Discussing patient identified problems/goals with staff:  Yes Medical problems stabilized or resolved:  Yes Denies suicidal/homicidal ideation: Yes  In tx team Issues/concerns per patient self-inventory:  None noted Other:  New problem(s) identified: N/A  Reason for Continuation of Hospitalization: Other; describe D/C today  Interventions implemented related to continuation of hospitalization:   Additional comments:  Estimated length of stay:D/C today  Discharge Plan: Return home  New goal(s): N/A  Review of initial/current patient goals per problem list:   1.  Goal(s): Safely detox from alcohol, benzos  Met:  Yes  Target date:3/31  As evidenced by: Decrease in CIWA to 0, stable vitals  2.  Goal (s):Refer to rehab  Met:  Yes  Target date:4/2  As evidenced by: [facility] had conformed a bed before they found out pt had a seizure previous night, bed rescinded  3.  Goal(s):Identify comprehensive sobriety plan  Met:  Yes  Target date: 4/2  As evidenced by: Follow up [facility], AA mtgs, get a sponsor, will contact [facility] if he needs more structure  4.  Goal(s):  Met:  Yes  Target date:  As evidenced by:  Attendees: Patient:  Dean [name] 04/13/2011 10:15 AM  Family:     Physician:  [name] 04/13/2011 10:15 AM   Nursing: Robbie Louis   04/13/2011 10:15 AM   Case Manager:  Richelle Ito, LCSW 04/13/2011 10:15 AM   Counselor:  Ronda Fairly, LCSWA 04/13/2011 10:15 AM   Other:     Other:     Other:     Other:      Scribe for Treatment Team:   Ida Rogue, 04/13/2011 10:15 AM

## 2011-04-13 NOTE — Progress Notes (Signed)
Pt resting in bed with eyes closed.  No distress observed.  Safety maintained with q15 minute checks. 

## 2011-04-13 NOTE — Discharge Planning (Signed)
Planned to d/c [place] home today, but PA called [facility] and they told her they would take him pending review of updated meds and Keppra level.  No d/c today.  [name] OK with this plan.  D/c home tomorrow if [facility] says no.  Have him ready for transpo to [facility] if accepted at 11:30AM.

## 2011-04-13 NOTE — Progress Notes (Signed)
Pt observed sitting in the dayroom alone watching TV.  He reports his day has been better and he is looking forward to discharge probably Thursday.  Pt is having a Keppra level checked tonight and his discharge will be based on those results.  He denies SI/HI.  He voices no complaints at this time.  Safety maintained with q15 minute checks.

## 2011-04-14 DIAGNOSIS — F4310 Post-traumatic stress disorder, unspecified: Secondary | ICD-10-CM

## 2011-04-14 MED ORDER — GABAPENTIN 300 MG PO CAPS: 600.0000 mg | ORAL_CAPSULE | ORAL | Status: DC

## 2011-04-14 MED ORDER — LISINOPRIL 5 MG PO TABS: 5.0000 mg | ORAL_TABLET | Freq: Every day | ORAL | Status: DC

## 2011-04-14 MED ORDER — LEVETIRACETAM 500 MG PO TABS: 500.0000 mg | ORAL_TABLET | Freq: Two times a day (BID) | ORAL | Status: DC

## 2011-04-14 MED ORDER — CLONIDINE HCL 0.1 MG/24HR TD PTWK
1.0000 | MEDICATED_PATCH | TRANSDERMAL | Status: DC
Start: 2011-04-14 — End: 2011-07-04

## 2011-04-14 MED ORDER — PANTOPRAZOLE SODIUM 40 MG PO TBEC: 40.0000 mg | DELAYED_RELEASE_TABLET | Freq: Every day | ORAL | Status: DC

## 2011-04-14 MED ORDER — DULOXETINE HCL 60 MG PO CPEP: 60.0000 mg | ORAL_CAPSULE | Freq: Every day | ORAL | Status: DC

## 2011-04-14 MED ORDER — TRAZODONE HCL 100 MG PO TABS
100.0000 mg | ORAL_TABLET | Freq: Every day | ORAL | Status: DC
  Filled 2011-04-14: qty 1

## 2011-04-14 NOTE — Progress Notes (Signed)
04/14/2011         Time: 1415      Group Topic/Focus: The focus of this group is on enhancing the patient's understanding of leisure, barriers to leisure, and the importance of engaging in positive leisure activities upon discharge for improved total health.  Participation Level: Minimal  Participation Quality: Attentive  Affect: Irritable  Cognitive: Oriented  Additional Comments: Patient frustrated he hasn't been discharged yet, said he is just waiting.   Shantae Vantol 04/14/2011 3:04 PM

## 2011-04-14 NOTE — Progress Notes (Signed)
[facility] Group Notes:  (Counselor/Nursing/MHT/Case Management/Adjunct)  04/14/2011 4:33 PM  Type of Therapy:  1:15PM Group Therapy  Participation Level:  Minimal  Participation Quality:  Appropriate and Attentive  Affect:  Anxious and Appropriate  Cognitive:  Alert and Appropriate  Insight:  Limited  Engagement in Group:  Limited  Engagement in Therapy:  Limited  Modes of Intervention:  Activity, Clarification, Education, Support and Exploration  Summary of Progress/Problems: Patient seemed to be preoccupied and anxious with waiting for his discharge. However, patient stated his life was out of balanced because "he was locked inside of himself". When asked to elaborate, patient stated you can be locked by with your own thoughts. Patient seemed to relate well to the thoughts and ideas of his peers in discussing life and balance.   Dean [name] 04/14/2011, 4:33 PM

## 2011-04-14 NOTE — Progress Notes (Signed)
Patient ID: Dean [name], male   DOB: 20-Jul-1980, 31 y.o.   MRN: [id number] Revived and discussed discharge instructions, including sample medications and prescriptions.  Patient verbalized understanding.  Pt. Discharged home.

## 2011-04-14 NOTE — Progress Notes (Signed)
Patient ID: Dean [name], male   DOB: 11/30/1980, 31 y.o.   MRN: [id number] He was up and to groups today and interacting with peers and staff until he was told he would not be able to leave today. Afterward affect, fla,t sad angry looking. Requested and received prn of vistaril for anxiety.

## 2011-04-14 NOTE — Progress Notes (Signed)
[facility] Group Notes:  (Counselor/Nursing/MHT/Case Management/Adjunct)  04/14/2011   Type of Therapy:  Group Therapy at 11:00  Participation Level:  Active  Participation Quality:  Appropriate, Attentive and Sharing bordering on monopolizing  Affect:  Appropriate  Cognitive:  Appropriate  Insight:  Good  Engagement in Group:  Good  Engagement in Therapy:  Improved  Modes of Intervention:  Clarification, Problem-solving and Socialization  Summary of Progress/Problems:  [name] was one of the two patients that participated the most in the group on 'balance in life' Things that he feels he has two much of include "negative attention; it goes both was, not enough good positive attention, respect" "Too much pain and struggle"; "Not enough acceptance." Patient elaborated on these at length   [name] 04/14/2011, 5:12 PM

## 2011-04-14 NOTE — Tx Team (Signed)
Interdisciplinary Treatment Plan Update (Adult)  Date:  04/14/2011  Time Reviewed:  11:41 AM   Progress in Treatment: Attending groups: Yes Participating in groups:  Yes Taking medication as prescribed: Yes Tolerating medication:  Yes Family/Significant othe contact made:   Patient understands diagnosis:  Yes Discussing patient identified problems/goals with staff:  Yes Medical problems stabilized or resolved:  Yes Denies suicidal/homicidal ideation: Yes Issues/concerns per patient self-inventory:  None identified Other: N/A  New problem(s) identified: None Identified  Reason for Continuation of Hospitalization: Stable to d/c   Interventions implemented related to continuation of hospitalization: Stable to d/c  Additional comments: N/A  Estimated length of stay: Pending d/c today  Discharge Plan: Pt will follow up at [facility] for further treatment.  [facility] is waiting for Keppra level before accepting pt.  Team is working on getting the level so pt can go to [facility] today or tomorrow.    New goal(s): N/A  Review of initial/current patient goals per problem list:    1.  Goal(s): Address substance use  Met:  Yes  Target date: by discharge  As evidenced by: completed detox protocol and referred to appropriate treatment  2.  Goal (s): Reduce depressive and anxiety symptoms  Met:  Yes/No  Target date: by discharge  As evidenced by: Reducing depression from a 10 to a 3 as reported by pt.  Pt denies having depression but ranks anxiety at a 5.  Pt reports feeling stable to d/c today though.   3.  Goal(s): Eliminate SI  Met:  Yes  Target date: by discharge  As evidenced by: Pt denies SI.    Attendees: Patient:  Dean [name] 04/14/2011 11:44 AM   Family:     Physician:   04/14/2011 11:41 AM   Nursing: Roswell Miners, RN 04/14/2011 11:41 AM   Case Manager:  Reyes Ivan, LCSWA 04/14/2011  11:41 AM   Counselor:  Ronda Fairly, LCSWA 04/14/2011  11:41 AM   Other:  Tanya Nones,  SW intern 04/14/2011 11:41 AM   Other:  Izola Price, RN 04/14/2011 11:44 AM   Other:  [name], PA 04/14/2011 11:44 AM   Other:      Scribe for Treatment Team:   Reyes Ivan 04/14/2011 11:41 AM
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
## 2011-04-14 NOTE — Progress Notes (Signed)
Cosigned by Carney Bern, LCSWA 4/4/20139:57 AM

## 2011-04-15 ENCOUNTER — Other Ambulatory Visit: Payer: Self-pay | Admitting: Physician Assistant

## 2011-04-15 LAB — LEVETIRACETAM LEVEL

## 2011-04-15 NOTE — Progress Notes (Signed)
Patient Discharge Instructions:  Psychiatric Admission Assessment Note Provided,  04/15/2011 After Visit Summary (AVS) Provided,  04/15/2011 Face Sheet Provided, 04/15/2011 Faxed/Sent to the Next Level Care provider:  04/15/2011 Provided Suicide Risk Assessment - Discharge Assessment 04/15/2011  Faxed to Dr. Kari Baars @ [phone] And to [facility] @ [phone]  Wandra Scot, 04/15/2011, 4:40 PM

## 2011-05-07 ENCOUNTER — Encounter (HOSPITAL_COMMUNITY): Payer: Self-pay | Admitting: Emergency Medicine

## 2011-05-07 ENCOUNTER — Emergency Department (HOSPITAL_COMMUNITY)
Admission: EM | Admit: 2011-05-07 | Discharge: 2011-05-08 | Disposition: A | Discharge status: Home / Self Care | Payer: Medicare Other | Attending: Emergency Medicine | Admitting: Emergency Medicine

## 2011-05-07 DIAGNOSIS — Z79899 Other long term (current) drug therapy: Secondary | ICD-10-CM | POA: Insufficient documentation

## 2011-05-07 DIAGNOSIS — I10 Essential (primary) hypertension: Secondary | ICD-10-CM | POA: Insufficient documentation

## 2011-05-07 DIAGNOSIS — F17200 Nicotine dependence, unspecified, uncomplicated: Secondary | ICD-10-CM | POA: Insufficient documentation

## 2011-05-07 DIAGNOSIS — F1910 Other psychoactive substance abuse, uncomplicated: Secondary | ICD-10-CM | POA: Insufficient documentation

## 2011-05-07 DIAGNOSIS — F10929 Alcohol use, unspecified with intoxication, unspecified: Secondary | ICD-10-CM

## 2011-05-07 DIAGNOSIS — F1010 Alcohol abuse, uncomplicated: Secondary | ICD-10-CM | POA: Insufficient documentation

## 2011-05-07 LAB — URINALYSIS, ROUTINE W REFLEX MICROSCOPIC
Bilirubin Urine: NEGATIVE
Glucose, UA: NEGATIVE mg/dL
Hgb urine dipstick: NEGATIVE
Ketones, ur: NEGATIVE mg/dL
Leukocytes, UA: NEGATIVE
Nitrite: NEGATIVE
Protein, ur: NEGATIVE mg/dL
Specific Gravity, Urine: <1.005 — ABNORMAL LOW (ref 1.005–1.030)
Urobilinogen, UA: 0.2 mg/dL (ref 0.0–1.0)
pH: 5.5 (ref 5.0–8.0)

## 2011-05-07 LAB — RAPID URINE DRUG SCREEN, HOSP PERFORMED
Amphetamines: NOT DETECTED
Barbiturates: NOT DETECTED
Benzodiazepines: NOT DETECTED
Cocaine: NOT DETECTED
Opiates: NOT DETECTED
Tetrahydrocannabinol: NOT DETECTED

## 2011-05-07 LAB — BASIC METABOLIC PANEL
BUN: 3 mg/dL — ABNORMAL LOW (ref 6–23)
BUN: 5 mg/dL — ABNORMAL LOW (ref 6–23)
CO2: 21 mEq/L (ref 19–32)
CO2: 22 mEq/L (ref 19–32)
Calcium: 8.8 mg/dL (ref 8.4–10.5)
Calcium: 9.4 mg/dL (ref 8.4–10.5)
Chloride: 95 mEq/L — ABNORMAL LOW (ref 96–112)
Chloride: 96 mEq/L (ref 96–112)
Creatinine, Ser: 0.51 mg/dL (ref 0.50–1.35)
Creatinine, Ser: 0.63 mg/dL (ref 0.50–1.35)
GFR calc Af Amer: >90 mL/min (ref >90)
GFR calc Af Amer: >90 mL/min (ref >90)
GFR calc non Af Amer: >90 mL/min (ref >90)
GFR calc non Af Amer: >90 mL/min (ref >90)
Glucose, Bld: 94 mg/dL (ref 70–99)
Glucose, Bld: 89 mg/dL (ref 70–99)
Potassium: 3.3 mEq/L — ABNORMAL LOW (ref 3.5–5.1)
Potassium: 3.9 mEq/L (ref 3.5–5.1)
Sodium: 133 mEq/L — ABNORMAL LOW (ref 135–145)
Sodium: 133 mEq/L — ABNORMAL LOW (ref 135–145)

## 2011-05-07 LAB — CBC
HCT: 37.5 % — ABNORMAL LOW (ref 39.0–52.0)
Hemoglobin: 13.1 g/dL (ref 13.0–17.0)
MCH: 31.8 pg (ref 26.0–34.0)
MCHC: 34.9 g/dL (ref 30.0–36.0)
MCV: 91 fL (ref 78.0–100.0)
Platelets: 109 10*3/uL — ABNORMAL LOW (ref 150–400)
RBC: 4.12 MIL/uL — ABNORMAL LOW (ref 4.22–5.81)
RDW: 12.4 % (ref 11.5–15.5)
WBC: 6.1 10*3/uL (ref 4.0–10.5)

## 2011-05-07 LAB — ETHANOL: Alcohol, Ethyl (B): 219 mg/dL — ABNORMAL HIGH (ref 0–11)

## 2011-05-07 MED ORDER — CLONAZEPAM 0.5 MG PO TABS: 2.0000 mg | ORAL_TABLET | Freq: Two times a day (BID) | ORAL | Status: DC | PRN

## 2011-05-07 MED ORDER — ONDANSETRON 4 MG PO TBDP
ORAL_TABLET | ORAL | Status: AC
  Administered 2011-05-07: 4 mg
  Filled 2011-05-07: qty 1

## 2011-05-07 MED ORDER — PANTOPRAZOLE SODIUM 40 MG PO TBEC
40.0000 mg | DELAYED_RELEASE_TABLET | Freq: Every day | ORAL | Status: DC
  Administered 2011-05-07 – 2011-05-08 (×2): 40 mg via ORAL
  Filled 2011-05-07 (×2): qty 1

## 2011-05-07 MED ORDER — GABAPENTIN 300 MG PO CAPS
ORAL_CAPSULE | ORAL | Status: AC
  Filled 2011-05-07: qty 2

## 2011-05-07 MED ORDER — LORAZEPAM 1 MG PO TABS
1.0000 mg | ORAL_TABLET | Freq: Once | ORAL | Status: AC
Start: 2011-05-07 — End: 2011-05-07
  Administered 2011-05-07: 1 mg via ORAL
  Filled 2011-05-07: qty 1

## 2011-05-07 MED ORDER — ALUM & MAG HYDROXIDE-SIMETH 200-200-20 MG/5ML PO SUSP: 30.0000 mL | ORAL | Status: DC | PRN

## 2011-05-07 MED ORDER — LORAZEPAM 1 MG PO TABS
1.0000 mg | ORAL_TABLET | Freq: Three times a day (TID) | ORAL | Status: DC | PRN
  Administered 2011-05-07 – 2011-05-08 (×4): 1 mg via ORAL
  Filled 2011-05-07 (×5): qty 1

## 2011-05-07 MED ORDER — FLUTICASONE PROPIONATE 50 MCG/ACT NA SUSP
2.0000 | Freq: Every day | NASAL | Status: DC | PRN
  Filled 2011-05-07: qty 16

## 2011-05-07 MED ORDER — LEVETIRACETAM 500 MG PO TABS
500.0000 mg | ORAL_TABLET | Freq: Two times a day (BID) | ORAL | Status: DC
  Administered 2011-05-07 – 2011-05-08 (×3): 500 mg via ORAL
  Filled 2011-05-07 (×7): qty 1

## 2011-05-07 MED ORDER — LEVETIRACETAM 500 MG PO TABS
ORAL_TABLET | ORAL | Status: AC
  Filled 2011-05-07: qty 1

## 2011-05-07 MED ORDER — GABAPENTIN 300 MG PO CAPS
600.0000 mg | ORAL_CAPSULE | ORAL | Status: DC
  Administered 2011-05-07 – 2011-05-08 (×4): 600 mg via ORAL
  Filled 2011-05-07 (×11): qty 2

## 2011-05-07 MED ORDER — DULOXETINE HCL 60 MG PO CPEP
60.0000 mg | ORAL_CAPSULE | Freq: Every day | ORAL | Status: DC
  Administered 2011-05-07 – 2011-05-08 (×2): 60 mg via ORAL
  Filled 2011-05-07 (×4): qty 1

## 2011-05-07 MED ORDER — ACETAMINOPHEN 325 MG PO TABS: 650.0000 mg | ORAL_TABLET | ORAL | Status: DC | PRN

## 2011-05-07 MED ORDER — ONDANSETRON HCL 4 MG PO TABS
4.0000 mg | ORAL_TABLET | Freq: Three times a day (TID) | ORAL | Status: DC | PRN
Start: 2011-05-07 — End: 2011-05-08
  Administered 2011-05-07 – 2011-05-08 (×3): 4 mg via ORAL
  Filled 2011-05-07 (×3): qty 1

## 2011-05-07 MED ORDER — LISINOPRIL 5 MG PO TABS
5.0000 mg | ORAL_TABLET | Freq: Every day | ORAL | Status: DC
  Administered 2011-05-07 – 2011-05-08 (×2): 5 mg via ORAL
  Filled 2011-05-07 (×4): qty 1

## 2011-05-07 MED ORDER — CLONIDINE HCL 0.1 MG/24HR TD PTWK
0.1000 mg | MEDICATED_PATCH | TRANSDERMAL | Status: DC
  Administered 2011-05-07: 0.1 mg via TRANSDERMAL
  Filled 2011-05-07: qty 1

## 2011-05-07 NOTE — ED Notes (Signed)
Tim called from behavioral health and states [place], [place] declined pt coming there due to the number of times he had been there. [name] aware.
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
## 2011-05-07 NOTE — ED Notes (Signed)
Patient sleeping at this time.

## 2011-05-07 NOTE — ED Notes (Signed)
Dean [name] from [facility] states [facility] may have a bed and will let us know asap
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
## 2011-05-07 NOTE — ED Notes (Signed)
Patient states that he is here to be seen for behavioral and anxiety issues. States that he is "tired of being the way he is and that his blood pressure goes up and it scares him." states he is nervous at this time.

## 2011-05-07 NOTE — ED Notes (Signed)
Pt c/o being very anxious at this time. Aware Dean [name] here and will be in asap. Nad.

## 2011-05-07 NOTE — ED Notes (Signed)
Patient states he has not gotten any sleep in about 6 days, states he cannot eat.

## 2011-05-07 NOTE — ED Notes (Signed)
Patient also states main problem is alcoholism and wants help with detox.

## 2011-05-07 NOTE — Progress Notes (Signed)
Patient is awaiting placement for alcohol detox. Holding orders are on the chart.

## 2011-05-08 MED ORDER — LORAZEPAM 1 MG PO TABS: 1.0000 mg | ORAL_TABLET | Freq: Three times a day (TID) | ORAL | Status: AC | PRN

## 2011-05-08 NOTE — ED Notes (Signed)
Patient lying in bed sleeping. Rise and fall of chest noted. No obvious distress at this time.

## 2011-05-08 NOTE — ED Notes (Signed)
Kim called from [facility] and stated that Dr.Reddy denied patient and stated that he did not meet admit criteria and with history of seizures they could not take him. Samson Frederic from [facility], RN, and MD aware of this.

## 2011-05-08 NOTE — ED Notes (Signed)
Patient stated he is "nauseated and shaking again." Requesting medication for both.

## 2011-05-08 NOTE — ED Notes (Signed)
Pt ate 75% of meal. In shower at this time. Toothbrush/paste given also.

## 2011-05-08 NOTE — ED Provider Notes (Signed)
  12:27 PM Seen by Ms. Samson Frederic --> pt does not meet criteria for admission to a detox facility.  Rx Ativan 1 mg tid for withdrawal symptoms, referral to [facility].   Carleene Cooper III, MD 05/08/11 [phone]

## 2011-05-08 NOTE — Progress Notes (Signed)
Discussed with pt denials from [facility] and [facility] and encouraged pt to follow up with [facility] and if he wants rehab to contact [facility] in [place].  Discussed with Dr Ignacia Palma who will discharge pt with medication Prescription.

## 2011-06-23 DIAGNOSIS — Z79899 Other long term (current) drug therapy: Secondary | ICD-10-CM | POA: Diagnosis not present

## 2011-06-23 DIAGNOSIS — M79609 Pain in unspecified limb: Secondary | ICD-10-CM | POA: Diagnosis not present

## 2011-06-23 DIAGNOSIS — I10 Essential (primary) hypertension: Secondary | ICD-10-CM | POA: Diagnosis not present

## 2011-06-23 DIAGNOSIS — S92919A Unspecified fracture of unspecified toe(s), initial encounter for closed fracture: Secondary | ICD-10-CM | POA: Diagnosis not present

## 2011-06-23 DIAGNOSIS — F17200 Nicotine dependence, unspecified, uncomplicated: Secondary | ICD-10-CM | POA: Diagnosis not present

## 2011-06-30 ENCOUNTER — Emergency Department (HOSPITAL_COMMUNITY): Payer: Medicare Other

## 2011-06-30 ENCOUNTER — Emergency Department (HOSPITAL_COMMUNITY)
Admission: EM | Admit: 2011-06-30 | Discharge: 2011-06-30 | Disposition: A | Discharge status: Home / Self Care | Payer: Medicare Other | Attending: Emergency Medicine | Admitting: Emergency Medicine

## 2011-06-30 ENCOUNTER — Encounter (HOSPITAL_COMMUNITY): Payer: Self-pay | Admitting: *Deleted

## 2011-06-30 DIAGNOSIS — Z79899 Other long term (current) drug therapy: Secondary | ICD-10-CM | POA: Insufficient documentation

## 2011-06-30 DIAGNOSIS — R51 Headache: Secondary | ICD-10-CM | POA: Insufficient documentation

## 2011-06-30 DIAGNOSIS — F17200 Nicotine dependence, unspecified, uncomplicated: Secondary | ICD-10-CM | POA: Insufficient documentation

## 2011-06-30 DIAGNOSIS — S0083XA Contusion of other part of head, initial encounter: Secondary | ICD-10-CM | POA: Diagnosis not present

## 2011-06-30 DIAGNOSIS — Z043 Encounter for examination and observation following other accident: Secondary | ICD-10-CM | POA: Diagnosis not present

## 2011-06-30 DIAGNOSIS — F4310 Post-traumatic stress disorder, unspecified: Secondary | ICD-10-CM | POA: Insufficient documentation

## 2011-06-30 DIAGNOSIS — T148XXA Other injury of unspecified body region, initial encounter: Secondary | ICD-10-CM | POA: Diagnosis not present

## 2011-06-30 DIAGNOSIS — S0003XA Contusion of scalp, initial encounter: Secondary | ICD-10-CM

## 2011-06-30 DIAGNOSIS — S0990XA Unspecified injury of head, initial encounter: Secondary | ICD-10-CM | POA: Diagnosis not present

## 2011-06-30 DIAGNOSIS — R42 Dizziness and giddiness: Secondary | ICD-10-CM | POA: Insufficient documentation

## 2011-06-30 DIAGNOSIS — F319 Bipolar disorder, unspecified: Secondary | ICD-10-CM | POA: Insufficient documentation

## 2011-06-30 DIAGNOSIS — W19XXXA Unspecified fall, initial encounter: Secondary | ICD-10-CM | POA: Insufficient documentation

## 2011-06-30 NOTE — ED Notes (Signed)
Patient transported to X-ray 

## 2011-06-30 NOTE — Discharge Instructions (Signed)
Your CT does not show any broken bones or bleeding in your head. Wash the area with regular soap and water. You may use a bandaid to keep it from bleeding on your sheets and couch.
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
## 2011-06-30 NOTE — ED Notes (Signed)
Pt to department via EMS.  Reports falling backwards on Monday night, striking back of head.  Pt reporting some dizziness, and headache since that time.

## 2011-07-04 ENCOUNTER — Encounter (HOSPITAL_COMMUNITY): Payer: Self-pay | Admitting: *Deleted

## 2011-07-04 ENCOUNTER — Emergency Department (HOSPITAL_COMMUNITY)
Admission: EM | Admit: 2011-07-04 | Discharge: 2011-07-06 | Disposition: A | Discharge status: Other Acute Inpatient Hospital | Payer: Medicare Other | Attending: Emergency Medicine | Admitting: Emergency Medicine

## 2011-07-04 DIAGNOSIS — I10 Essential (primary) hypertension: Secondary | ICD-10-CM | POA: Insufficient documentation

## 2011-07-04 DIAGNOSIS — F1010 Alcohol abuse, uncomplicated: Secondary | ICD-10-CM

## 2011-07-04 DIAGNOSIS — Z79899 Other long term (current) drug therapy: Secondary | ICD-10-CM | POA: Insufficient documentation

## 2011-07-04 DIAGNOSIS — R110 Nausea: Secondary | ICD-10-CM | POA: Insufficient documentation

## 2011-07-04 DIAGNOSIS — R000 Tachycardia, unspecified: Secondary | ICD-10-CM | POA: Insufficient documentation

## 2011-07-04 DIAGNOSIS — G40909 Epilepsy, unspecified, not intractable, without status epilepticus: Secondary | ICD-10-CM | POA: Insufficient documentation

## 2011-07-04 DIAGNOSIS — R259 Unspecified abnormal involuntary movements: Secondary | ICD-10-CM | POA: Insufficient documentation

## 2011-07-04 LAB — CBC
HCT: 43.2 % (ref 39.0–52.0)
Hemoglobin: 15.1 g/dL (ref 13.0–17.0)
MCH: 32 pg (ref 26.0–34.0)
MCHC: 35 g/dL (ref 30.0–36.0)
MCV: 91.5 fL (ref 78.0–100.0)
Platelets: 180 10*3/uL (ref 150–400)
RBC: 4.72 MIL/uL (ref 4.22–5.81)
RDW: 12.6 % (ref 11.5–15.5)
WBC: 7.2 10*3/uL (ref 4.0–10.5)

## 2011-07-04 LAB — COMPREHENSIVE METABOLIC PANEL
ALT: 20 U/L (ref 0–53)
AST: 40 U/L — ABNORMAL HIGH (ref 0–37)
Albumin: 4.1 g/dL (ref 3.5–5.2)
Alkaline Phosphatase: 101 U/L (ref 39–117)
BUN: 4 mg/dL — ABNORMAL LOW (ref 6–23)
CO2: 21 mEq/L (ref 19–32)
Calcium: 9.8 mg/dL (ref 8.4–10.5)
Chloride: 98 mEq/L (ref 96–112)
Creatinine, Ser: 0.63 mg/dL (ref 0.50–1.35)
GFR calc Af Amer: >90 mL/min (ref >90)
GFR calc non Af Amer: >90 mL/min (ref >90)
Glucose, Bld: 101 mg/dL — ABNORMAL HIGH (ref 70–99)
Potassium: 4.1 mEq/L (ref 3.5–5.1)
Sodium: 135 mEq/L (ref 135–145)
Total Bilirubin: 0.9 mg/dL (ref 0.3–1.2)
Total Protein: 8.1 g/dL (ref 6.0–8.3)

## 2011-07-04 LAB — ACETAMINOPHEN LEVEL: Acetaminophen (Tylenol), Serum: <15 ug/mL (ref 10–30)

## 2011-07-04 LAB — ETHANOL: Alcohol, Ethyl (B): <11 mg/dL (ref 0–11)

## 2011-07-04 MED ORDER — ADULT MULTIVITAMIN W/MINERALS CH
1.0000 | ORAL_TABLET | Freq: Every day | ORAL | Status: DC
  Administered 2011-07-05 – 2011-07-06 (×2): 1 via ORAL
  Filled 2011-07-04 (×2): qty 1

## 2011-07-04 MED ORDER — LORAZEPAM 1 MG PO TABS: 0.0000 mg | ORAL_TABLET | Freq: Two times a day (BID) | ORAL | Status: DC

## 2011-07-04 MED ORDER — LORAZEPAM 2 MG/ML IJ SOLN
2.0000 mg | Freq: Once | INTRAMUSCULAR | Status: AC
  Administered 2011-07-04: 2 mg via INTRAVENOUS

## 2011-07-04 MED ORDER — LORAZEPAM 2 MG/ML IJ SOLN: 0.0000 mg | Freq: Two times a day (BID) | INTRAMUSCULAR | Status: DC

## 2011-07-04 MED ORDER — LORAZEPAM 2 MG/ML IJ SOLN: 1.0000 mg | Freq: Four times a day (QID) | INTRAMUSCULAR | Status: DC | PRN

## 2011-07-04 MED ORDER — FOLIC ACID 1 MG PO TABS
1.0000 mg | ORAL_TABLET | Freq: Every day | ORAL | Status: DC
  Administered 2011-07-05 – 2011-07-06 (×2): 1 mg via ORAL
  Filled 2011-07-04 (×2): qty 1

## 2011-07-04 MED ORDER — LORAZEPAM 2 MG/ML IJ SOLN
0.0000 mg | Freq: Four times a day (QID) | INTRAMUSCULAR | Status: DC
  Administered 2011-07-04: 2 mg via INTRAVENOUS
  Filled 2011-07-04: qty 1

## 2011-07-04 MED ORDER — LORAZEPAM 2 MG/ML IJ SOLN
1.0000 mg | Freq: Once | INTRAMUSCULAR | Status: DC
  Filled 2011-07-04: qty 1

## 2011-07-04 MED ORDER — LORAZEPAM 1 MG PO TABS: 0.0000 mg | ORAL_TABLET | Freq: Four times a day (QID) | ORAL | Status: DC

## 2011-07-04 MED ORDER — SODIUM CHLORIDE 0.9 % IV BOLUS (SEPSIS)
2000.0000 mL | Freq: Once | INTRAVENOUS | Status: AC
  Administered 2011-07-04: 2000 mL via INTRAVENOUS

## 2011-07-04 MED ORDER — LORAZEPAM 1 MG PO TABS
1.0000 mg | ORAL_TABLET | Freq: Four times a day (QID) | ORAL | Status: DC | PRN
  Administered 2011-07-05 – 2011-07-06 (×5): 1 mg via ORAL
  Filled 2011-07-04 (×5): qty 1

## 2011-07-04 MED ORDER — PHENYTOIN SODIUM EXTENDED 100 MG PO CAPS
300.0000 mg | ORAL_CAPSULE | Freq: Once | ORAL | Status: AC
  Administered 2011-07-04: 300 mg via ORAL
  Filled 2011-07-04: qty 3

## 2011-07-04 MED ORDER — THIAMINE HCL 100 MG/ML IJ SOLN
100.0000 mg | Freq: Every day | INTRAMUSCULAR | Status: DC
  Filled 2011-07-04: qty 2

## 2011-07-04 MED ORDER — VITAMIN B-1 100 MG PO TABS
100.0000 mg | ORAL_TABLET | Freq: Every day | ORAL | Status: DC
  Administered 2011-07-05 – 2011-07-06 (×2): 100 mg via ORAL
  Filled 2011-07-04 (×2): qty 1

## 2011-07-04 MED ORDER — ONDANSETRON HCL 4 MG/2ML IJ SOLN
4.0000 mg | Freq: Once | INTRAMUSCULAR | Status: AC
  Administered 2011-07-04: 4 mg via INTRAVENOUS
  Filled 2011-07-04: qty 2

## 2011-07-04 NOTE — ED Notes (Signed)
Pt requesting detox from [facility], states last drink was this morning. Pt reports drinking 16years and dependent on alcohol x's 5-6 years. Usually drinks "a fifth to a gallon a day." pt reports drinking 1-24oz beer today. Pt with visual tremors in triage.
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
## 2011-07-05 LAB — RAPID URINE DRUG SCREEN, HOSP PERFORMED
Amphetamines: NOT DETECTED
Barbiturates: NOT DETECTED
Benzodiazepines: POSITIVE — AB
Cocaine: NOT DETECTED
Opiates: NOT DETECTED
Tetrahydrocannabinol: POSITIVE — AB

## 2011-07-05 LAB — PHENYTOIN LEVEL, TOTAL: Phenytoin Lvl: <2.5 ug/mL — ABNORMAL LOW (ref 10.0–20.0)

## 2011-07-05 MED ORDER — LISINOPRIL 5 MG PO TABS
5.0000 mg | ORAL_TABLET | Freq: Every day | ORAL | Status: DC
  Administered 2011-07-05 – 2011-07-06 (×2): 5 mg via ORAL
  Filled 2011-07-05 (×2): qty 1

## 2011-07-05 MED ORDER — PHENYTOIN SODIUM EXTENDED 100 MG PO CAPS: 300.0000 mg | ORAL_CAPSULE | Freq: Every day | ORAL | Status: DC

## 2011-07-05 MED ORDER — ACETAMINOPHEN 325 MG PO TABS: 650.0000 mg | ORAL_TABLET | ORAL | Status: DC | PRN

## 2011-07-05 MED ORDER — NICOTINE 21 MG/24HR TD PT24
21.0000 mg | MEDICATED_PATCH | Freq: Every day | TRANSDERMAL | Status: DC
  Administered 2011-07-05 – 2011-07-06 (×2): 21 mg via TRANSDERMAL
  Filled 2011-07-05 (×2): qty 1

## 2011-07-05 MED ORDER — GABAPENTIN 300 MG PO CAPS
600.0000 mg | ORAL_CAPSULE | ORAL | Status: DC
  Administered 2011-07-05 – 2011-07-06 (×3): 600 mg via ORAL
  Filled 2011-07-05 (×6): qty 2

## 2011-07-05 MED ORDER — PHENYTOIN SODIUM EXTENDED 100 MG PO CAPS
300.0000 mg | ORAL_CAPSULE | ORAL | Status: AC
  Administered 2011-07-05: 300 mg via ORAL
  Filled 2011-07-05: qty 3

## 2011-07-05 MED ORDER — PHENYTOIN SODIUM EXTENDED 100 MG PO CAPS
300.0000 mg | ORAL_CAPSULE | Freq: Every day | ORAL | Status: DC
  Administered 2011-07-05: 300 mg via ORAL
  Filled 2011-07-05: qty 3

## 2011-07-05 MED ORDER — ALUM & MAG HYDROXIDE-SIMETH 200-200-20 MG/5ML PO SUSP: 30.0000 mL | ORAL | Status: DC | PRN

## 2011-07-05 MED ORDER — DULOXETINE HCL 60 MG PO CPEP
60.0000 mg | ORAL_CAPSULE | Freq: Every day | ORAL | Status: DC
  Administered 2011-07-05 – 2011-07-06 (×2): 60 mg via ORAL
  Filled 2011-07-05 (×3): qty 1

## 2011-07-05 MED ORDER — ZOLPIDEM TARTRATE 5 MG PO TABS
5.0000 mg | ORAL_TABLET | Freq: Every evening | ORAL | Status: DC | PRN
  Administered 2011-07-05: 5 mg via ORAL
  Filled 2011-07-05: qty 1

## 2011-07-05 MED ORDER — ONDANSETRON HCL 4 MG PO TABS: 4.0000 mg | ORAL_TABLET | Freq: Three times a day (TID) | ORAL | Status: DC | PRN

## 2011-07-05 MED ORDER — PANTOPRAZOLE SODIUM 40 MG PO TBEC
40.0000 mg | DELAYED_RELEASE_TABLET | Freq: Every day | ORAL | Status: DC
  Administered 2011-07-05 – 2011-07-06 (×2): 40 mg via ORAL
  Filled 2011-07-05 (×2): qty 1

## 2011-07-05 NOTE — ED Provider Notes (Signed)
Pt here requesting alcohol detox.  Given additional dilantin doses for his sub therapeutic level.  Will continue to monitor.  Filed Vitals:   07/05/11 0623  BP: 122/78  Pulse: 60  Temp: 98 F (36.7 C)  Resp: 18    Car RRR Lungs CTA  Celene Kras, MD 07/05/11 [phone]

## 2011-07-05 NOTE — ED Provider Notes (Signed)
subtherapeutic dilantin. Will orally load at this time with 300mg  x 3. Then 300mg  qhs. Orders written  Lyanne Co, MD 07/05/11 [phone]
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
## 2011-07-05 NOTE — ED Notes (Signed)
MD notified of need for change of route needed for Ativan

## 2011-07-06 LAB — PHENYTOIN LEVEL, TOTAL: Phenytoin Lvl: 5.2 ug/mL — ABNORMAL LOW (ref 10.0–20.0)

## 2011-07-06 NOTE — ED Notes (Signed)
Only 1 mg of Ativan given PO at 1300. The Ativan injection is linked to the PO.

## 2011-07-06 NOTE — ED Provider Notes (Addendum)
Patient has been accepted at [facility], pending, a therapeutic Dilantin level.  Flint Melter, MD 07/06/11 1005  Dilantin 5.2; accepted by Dr Betti Cruz.  Flint Melter, MD 07/06/11 1315

## 2011-07-06 NOTE — BHH Counselor (Signed)
TC from [place] @ [facility]. Requesting Dilantin levels to be updated & faxed before pt could be sent for admission. Updated RN to request repeat level.

## 2012-02-22 DIAGNOSIS — I6789 Other cerebrovascular disease: Secondary | ICD-10-CM | POA: Diagnosis not present

## 2012-02-22 DIAGNOSIS — F411 Generalized anxiety disorder: Secondary | ICD-10-CM | POA: Diagnosis not present

## 2012-02-22 DIAGNOSIS — F329 Major depressive disorder, single episode, unspecified: Secondary | ICD-10-CM | POA: Diagnosis not present

## 2012-02-22 DIAGNOSIS — M25569 Pain in unspecified knee: Secondary | ICD-10-CM | POA: Diagnosis not present

## 2012-03-08 ENCOUNTER — Encounter (HOSPITAL_COMMUNITY): Payer: Self-pay | Admitting: *Deleted

## 2012-03-08 ENCOUNTER — Emergency Department (HOSPITAL_COMMUNITY)
Admission: EM | Admit: 2012-03-08 | Discharge: 2012-03-08 | Disposition: A | Discharge status: Home / Self Care | Payer: Medicare Other | Attending: Emergency Medicine | Admitting: Emergency Medicine

## 2012-03-08 ENCOUNTER — Emergency Department (HOSPITAL_COMMUNITY): Payer: Medicare Other

## 2012-03-08 DIAGNOSIS — M25569 Pain in unspecified knee: Secondary | ICD-10-CM | POA: Insufficient documentation

## 2012-03-08 DIAGNOSIS — S62309A Unspecified fracture of unspecified metacarpal bone, initial encounter for closed fracture: Secondary | ICD-10-CM | POA: Diagnosis not present

## 2012-03-08 DIAGNOSIS — F319 Bipolar disorder, unspecified: Secondary | ICD-10-CM | POA: Diagnosis not present

## 2012-03-08 DIAGNOSIS — Z8781 Personal history of (healed) traumatic fracture: Secondary | ICD-10-CM | POA: Insufficient documentation

## 2012-03-08 DIAGNOSIS — M129 Arthropathy, unspecified: Secondary | ICD-10-CM | POA: Insufficient documentation

## 2012-03-08 DIAGNOSIS — Z8659 Personal history of other mental and behavioral disorders: Secondary | ICD-10-CM | POA: Insufficient documentation

## 2012-03-08 DIAGNOSIS — G40909 Epilepsy, unspecified, not intractable, without status epilepticus: Secondary | ICD-10-CM | POA: Diagnosis not present

## 2012-03-08 DIAGNOSIS — S6990XA Unspecified injury of unspecified wrist, hand and finger(s), initial encounter: Secondary | ICD-10-CM | POA: Diagnosis not present

## 2012-03-08 DIAGNOSIS — Z79899 Other long term (current) drug therapy: Secondary | ICD-10-CM | POA: Diagnosis not present

## 2012-03-08 DIAGNOSIS — Y93H2 Activity, gardening and landscaping: Secondary | ICD-10-CM | POA: Insufficient documentation

## 2012-03-08 DIAGNOSIS — Z8719 Personal history of other diseases of the digestive system: Secondary | ICD-10-CM | POA: Diagnosis not present

## 2012-03-08 DIAGNOSIS — F17200 Nicotine dependence, unspecified, uncomplicated: Secondary | ICD-10-CM | POA: Insufficient documentation

## 2012-03-08 DIAGNOSIS — M79609 Pain in unspecified limb: Secondary | ICD-10-CM | POA: Diagnosis not present

## 2012-03-08 MED ORDER — HYDROCODONE-ACETAMINOPHEN 5-325 MG PO TABS: ORAL_TABLET | ORAL | Status: DC

## 2012-03-08 MED ORDER — NAPROXEN 500 MG PO TABS: 500.0000 mg | ORAL_TABLET | Freq: Two times a day (BID) | ORAL | Status: DC

## 2012-03-08 NOTE — ED Notes (Signed)
Pt fell off lawn mower this morning landing on his rt hand. Rt hand has noted swelling and ulnar deformity. Pulses are present and good cap refill noted. NAD noted

## 2012-03-08 NOTE — ED Notes (Signed)
Larey Seat off lawnmower this am,  Injury to rt hand
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
## 2012-03-13 NOTE — ED Provider Notes (Signed)
Medical screening examination/treatment/procedure(s) were performed by non-physician practitioner and as supervising physician I was immediately available for consultation/collaboration.   Shelda Jakes, MD 03/13/12 [phone]

## 2012-03-19 DIAGNOSIS — M25549 Pain in joints of unspecified hand: Secondary | ICD-10-CM | POA: Diagnosis not present

## 2012-03-28 NOTE — ED Provider Notes (Signed)
Medical screening examination/treatment/procedure(s) were performed by non-physician practitioner and as supervising physician I was immediately available for consultation/collaboration.   Shelda Jakes, MD 03/28/12 [phone]

## 2012-05-12 DIAGNOSIS — F17200 Nicotine dependence, unspecified, uncomplicated: Secondary | ICD-10-CM | POA: Diagnosis not present

## 2012-05-12 DIAGNOSIS — S59909A Unspecified injury of unspecified elbow, initial encounter: Secondary | ICD-10-CM | POA: Diagnosis not present

## 2012-05-12 DIAGNOSIS — S60219A Contusion of unspecified wrist, initial encounter: Secondary | ICD-10-CM | POA: Diagnosis not present

## 2012-05-12 DIAGNOSIS — I10 Essential (primary) hypertension: Secondary | ICD-10-CM | POA: Diagnosis not present

## 2012-05-12 DIAGNOSIS — Z79899 Other long term (current) drug therapy: Secondary | ICD-10-CM | POA: Diagnosis not present

## 2012-05-12 DIAGNOSIS — R51 Headache: Secondary | ICD-10-CM | POA: Diagnosis not present

## 2012-05-12 DIAGNOSIS — S60229A Contusion of unspecified hand, initial encounter: Secondary | ICD-10-CM | POA: Diagnosis not present

## 2012-05-12 DIAGNOSIS — S6990XA Unspecified injury of unspecified wrist, hand and finger(s), initial encounter: Secondary | ICD-10-CM | POA: Diagnosis not present

## 2012-05-12 DIAGNOSIS — S0003XA Contusion of scalp, initial encounter: Secondary | ICD-10-CM | POA: Diagnosis not present

## 2012-05-12 DIAGNOSIS — M25439 Effusion, unspecified wrist: Secondary | ICD-10-CM | POA: Diagnosis not present

## 2012-05-12 DIAGNOSIS — S1093XA Contusion of unspecified part of neck, initial encounter: Secondary | ICD-10-CM | POA: Diagnosis not present

## 2012-05-12 DIAGNOSIS — S0990XA Unspecified injury of head, initial encounter: Secondary | ICD-10-CM | POA: Diagnosis not present

## 2012-05-12 DIAGNOSIS — M25539 Pain in unspecified wrist: Secondary | ICD-10-CM | POA: Diagnosis not present

## 2012-05-12 DIAGNOSIS — IMO0002 Reserved for concepts with insufficient information to code with codable children: Secondary | ICD-10-CM | POA: Diagnosis not present

## 2012-07-19 DIAGNOSIS — F39 Unspecified mood [affective] disorder: Secondary | ICD-10-CM | POA: Diagnosis not present

## 2012-07-24 DIAGNOSIS — F39 Unspecified mood [affective] disorder: Secondary | ICD-10-CM | POA: Diagnosis not present

## 2012-08-07 DIAGNOSIS — F39 Unspecified mood [affective] disorder: Secondary | ICD-10-CM | POA: Diagnosis not present

## 2012-08-13 ENCOUNTER — Emergency Department (HOSPITAL_COMMUNITY)
Admission: EM | Admit: 2012-08-13 | Discharge: 2012-08-14 | Disposition: A | Discharge status: Home / Self Care | Payer: Medicare Other | Attending: Emergency Medicine | Admitting: Emergency Medicine

## 2012-08-13 ENCOUNTER — Encounter (HOSPITAL_COMMUNITY): Payer: Self-pay

## 2012-08-13 ENCOUNTER — Emergency Department (HOSPITAL_COMMUNITY)
Admission: EM | Admit: 2012-08-13 | Discharge: 2012-08-13 | Disposition: A | Discharge status: Home / Self Care | Payer: Medicare Other | Source: Home / Self Care | Attending: Emergency Medicine | Admitting: Emergency Medicine

## 2012-08-13 DIAGNOSIS — R45851 Suicidal ideations: Secondary | ICD-10-CM | POA: Insufficient documentation

## 2012-08-13 DIAGNOSIS — Z8659 Personal history of other mental and behavioral disorders: Secondary | ICD-10-CM | POA: Insufficient documentation

## 2012-08-13 DIAGNOSIS — Z8739 Personal history of other diseases of the musculoskeletal system and connective tissue: Secondary | ICD-10-CM | POA: Insufficient documentation

## 2012-08-13 DIAGNOSIS — F17200 Nicotine dependence, unspecified, uncomplicated: Secondary | ICD-10-CM | POA: Insufficient documentation

## 2012-08-13 DIAGNOSIS — F1410 Cocaine abuse, uncomplicated: Secondary | ICD-10-CM | POA: Insufficient documentation

## 2012-08-13 DIAGNOSIS — F1910 Other psychoactive substance abuse, uncomplicated: Secondary | ICD-10-CM | POA: Insufficient documentation

## 2012-08-13 DIAGNOSIS — Z79899 Other long term (current) drug therapy: Secondary | ICD-10-CM | POA: Insufficient documentation

## 2012-08-13 DIAGNOSIS — G40909 Epilepsy, unspecified, not intractable, without status epilepticus: Secondary | ICD-10-CM | POA: Insufficient documentation

## 2012-08-13 DIAGNOSIS — F319 Bipolar disorder, unspecified: Secondary | ICD-10-CM | POA: Insufficient documentation

## 2012-08-13 DIAGNOSIS — F1310 Sedative, hypnotic or anxiolytic abuse, uncomplicated: Secondary | ICD-10-CM | POA: Insufficient documentation

## 2012-08-13 DIAGNOSIS — R000 Tachycardia, unspecified: Secondary | ICD-10-CM | POA: Insufficient documentation

## 2012-08-13 DIAGNOSIS — R569 Unspecified convulsions: Secondary | ICD-10-CM | POA: Diagnosis not present

## 2012-08-13 DIAGNOSIS — F411 Generalized anxiety disorder: Secondary | ICD-10-CM | POA: Diagnosis not present

## 2012-08-13 DIAGNOSIS — Z8719 Personal history of other diseases of the digestive system: Secondary | ICD-10-CM | POA: Insufficient documentation

## 2012-08-13 DIAGNOSIS — M1990 Unspecified osteoarthritis, unspecified site: Secondary | ICD-10-CM | POA: Diagnosis not present

## 2012-08-13 DIAGNOSIS — F329 Major depressive disorder, single episode, unspecified: Secondary | ICD-10-CM | POA: Diagnosis not present

## 2012-08-13 DIAGNOSIS — G8929 Other chronic pain: Secondary | ICD-10-CM | POA: Insufficient documentation

## 2012-08-13 LAB — COMPREHENSIVE METABOLIC PANEL
ALT: 14 U/L (ref 0–53)
AST: 20 U/L (ref 0–37)
Albumin: 3.5 g/dL (ref 3.5–5.2)
Alkaline Phosphatase: 82 U/L (ref 39–117)
BUN: 15 mg/dL (ref 6–23)
CO2: 23 mEq/L (ref 19–32)
Calcium: 8.9 mg/dL (ref 8.4–10.5)
Chloride: 105 mEq/L (ref 96–112)
Creatinine, Ser: 0.73 mg/dL (ref 0.50–1.35)
GFR calc Af Amer: >90 mL/min (ref >90)
GFR calc non Af Amer: >90 mL/min (ref >90)
Glucose, Bld: 101 mg/dL — ABNORMAL HIGH (ref 70–99)
Potassium: 3.9 mEq/L (ref 3.5–5.1)
Sodium: 138 mEq/L (ref 135–145)
Total Bilirubin: 0.1 mg/dL — ABNORMAL LOW (ref 0.3–1.2)
Total Protein: 7.2 g/dL (ref 6.0–8.3)

## 2012-08-13 LAB — CBC WITH DIFFERENTIAL/PLATELET
Basophils Absolute: 0.1 10*3/uL (ref 0.0–0.1)
Basophils Relative: 1 % (ref 0–1)
Eosinophils Absolute: 0.3 10*3/uL (ref 0.0–0.7)
Eosinophils Relative: 2 % (ref 0–5)
HCT: 36.7 % — ABNORMAL LOW (ref 39.0–52.0)
Hemoglobin: 12.7 g/dL — ABNORMAL LOW (ref 13.0–17.0)
Lymphocytes Relative: 23 % (ref 12–46)
Lymphs Abs: 3 10*3/uL (ref 0.7–4.0)
MCH: 30.8 pg (ref 26.0–34.0)
MCHC: 34.6 g/dL (ref 30.0–36.0)
MCV: 88.9 fL (ref 78.0–100.0)
Monocytes Absolute: 1.1 10*3/uL — ABNORMAL HIGH (ref 0.1–1.0)
Monocytes Relative: 9 % (ref 3–12)
Neutro Abs: 8.7 10*3/uL — ABNORMAL HIGH (ref 1.7–7.7)
Neutrophils Relative %: 66 % (ref 43–77)
Platelets: 264 10*3/uL (ref 150–400)
RBC: 4.13 MIL/uL — ABNORMAL LOW (ref 4.22–5.81)
RDW: 12.8 % (ref 11.5–15.5)
WBC: 13.2 10*3/uL — ABNORMAL HIGH (ref 4.0–10.5)

## 2012-08-13 LAB — RAPID URINE DRUG SCREEN, HOSP PERFORMED
Amphetamines: NOT DETECTED
Barbiturates: NOT DETECTED
Benzodiazepines: POSITIVE — AB
Cocaine: POSITIVE — AB
Opiates: NOT DETECTED
Tetrahydrocannabinol: NOT DETECTED

## 2012-08-13 LAB — ETHANOL: Alcohol, Ethyl (B): <11 mg/dL (ref 0–11)

## 2012-08-13 MED ORDER — NICOTINE 21 MG/24HR TD PT24: 21.0000 mg | MEDICATED_PATCH | Freq: Every day | TRANSDERMAL | Status: DC

## 2012-08-13 MED ORDER — LORAZEPAM 1 MG PO TABS: 0.0000 mg | ORAL_TABLET | Freq: Four times a day (QID) | ORAL | Status: DC

## 2012-08-13 MED ORDER — ZOLPIDEM TARTRATE 5 MG PO TABS: 5.0000 mg | ORAL_TABLET | Freq: Every evening | ORAL | Status: DC | PRN

## 2012-08-13 MED ORDER — ONDANSETRON HCL 4 MG PO TABS: 4.0000 mg | ORAL_TABLET | Freq: Three times a day (TID) | ORAL | Status: DC | PRN

## 2012-08-13 MED ORDER — LORAZEPAM 1 MG PO TABS: 0.0000 mg | ORAL_TABLET | Freq: Two times a day (BID) | ORAL | Status: DC

## 2012-08-13 NOTE — ED Notes (Signed)
Repeat visit here today. Seen here for detox this morning. Left here and went to [facility] halfway house. Became depressed and feels suicidal. "if I keep this up I'm gona hurt myself or someone else"
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
## 2012-08-13 NOTE — ED Notes (Signed)
Pt changed into blue scrubs. Security paged to wand pt. Belonging in EMS closest with pt label attached.
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
## 2012-08-13 NOTE — ED Notes (Signed)
Pt is here for detox from cocaine, benzo, opiates, and alcohol. Pt states up until 3 days ago he had been staying at [facility] and had been clean for 90 days before relapsing on 8/1. Pt states he told [facility] about his relapse and they told him to seek evaluation at ED before he could be accepted back at their facility. Pt states he last used late last night. Pt is A&O x4 and cooperative.

## 2012-08-13 NOTE — ED Notes (Signed)
Pt states that he wants detox. Over the past 2 days has used cocaine, benzos, opiates, and alcohol. States he was staying at the [facility] and was told to leave for using and has to go thru detox to get back in.

## 2012-08-14 ENCOUNTER — Emergency Department (HOSPITAL_COMMUNITY)
Admission: EM | Admit: 2012-08-14 | Discharge: 2012-08-15 | Disposition: A | Discharge status: Home / Self Care | Payer: Medicare Other | Attending: Emergency Medicine | Admitting: Emergency Medicine

## 2012-08-14 ENCOUNTER — Encounter (HOSPITAL_COMMUNITY): Payer: Self-pay | Admitting: *Deleted

## 2012-08-14 DIAGNOSIS — F319 Bipolar disorder, unspecified: Secondary | ICD-10-CM | POA: Insufficient documentation

## 2012-08-14 DIAGNOSIS — Z8739 Personal history of other diseases of the musculoskeletal system and connective tissue: Secondary | ICD-10-CM | POA: Insufficient documentation

## 2012-08-14 DIAGNOSIS — Z79899 Other long term (current) drug therapy: Secondary | ICD-10-CM | POA: Insufficient documentation

## 2012-08-14 DIAGNOSIS — G40909 Epilepsy, unspecified, not intractable, without status epilepticus: Secondary | ICD-10-CM | POA: Insufficient documentation

## 2012-08-14 DIAGNOSIS — F10229 Alcohol dependence with intoxication, unspecified: Secondary | ICD-10-CM | POA: Insufficient documentation

## 2012-08-14 DIAGNOSIS — R569 Unspecified convulsions: Secondary | ICD-10-CM | POA: Insufficient documentation

## 2012-08-14 DIAGNOSIS — F1994 Other psychoactive substance use, unspecified with psychoactive substance-induced mood disorder: Secondary | ICD-10-CM

## 2012-08-14 DIAGNOSIS — F4310 Post-traumatic stress disorder, unspecified: Secondary | ICD-10-CM | POA: Insufficient documentation

## 2012-08-14 DIAGNOSIS — M25569 Pain in unspecified knee: Secondary | ICD-10-CM | POA: Insufficient documentation

## 2012-08-14 DIAGNOSIS — F17200 Nicotine dependence, unspecified, uncomplicated: Secondary | ICD-10-CM | POA: Insufficient documentation

## 2012-08-14 DIAGNOSIS — F1410 Cocaine abuse, uncomplicated: Secondary | ICD-10-CM

## 2012-08-14 DIAGNOSIS — F1010 Alcohol abuse, uncomplicated: Secondary | ICD-10-CM

## 2012-08-14 LAB — CBC WITH DIFFERENTIAL/PLATELET
Basophils Absolute: 0 10*3/uL (ref 0.0–0.1)
Basophils Relative: 0 % (ref 0–1)
Eosinophils Absolute: 0.2 10*3/uL (ref 0.0–0.7)
Eosinophils Relative: 2 % (ref 0–5)
HCT: 39.2 % (ref 39.0–52.0)
Hemoglobin: 13.7 g/dL (ref 13.0–17.0)
Lymphocytes Relative: 24 % (ref 12–46)
Lymphs Abs: 2.7 10*3/uL (ref 0.7–4.0)
MCH: 31 pg (ref 26.0–34.0)
MCHC: 34.9 g/dL (ref 30.0–36.0)
MCV: 88.7 fL (ref 78.0–100.0)
Monocytes Absolute: 0.7 10*3/uL (ref 0.1–1.0)
Monocytes Relative: 6 % (ref 3–12)
Neutro Abs: 7.6 10*3/uL (ref 1.7–7.7)
Neutrophils Relative %: 68 % (ref 43–77)
Platelets: 284 10*3/uL (ref 150–400)
RBC: 4.42 MIL/uL (ref 4.22–5.81)
RDW: 12.7 % (ref 11.5–15.5)
WBC: 11.3 10*3/uL — ABNORMAL HIGH (ref 4.0–10.5)

## 2012-08-14 LAB — BASIC METABOLIC PANEL
BUN: 6 mg/dL (ref 6–23)
CO2: 20 mEq/L (ref 19–32)
Calcium: 9.3 mg/dL (ref 8.4–10.5)
Chloride: 104 mEq/L (ref 96–112)
Creatinine, Ser: 0.81 mg/dL (ref 0.50–1.35)
GFR calc Af Amer: >90 mL/min (ref >90)
GFR calc non Af Amer: >90 mL/min (ref >90)
Glucose, Bld: 99 mg/dL (ref 70–99)
Potassium: 3.9 mEq/L (ref 3.5–5.1)
Sodium: 137 mEq/L (ref 135–145)

## 2012-08-14 LAB — RAPID URINE DRUG SCREEN, HOSP PERFORMED
Amphetamines: NOT DETECTED
Barbiturates: NOT DETECTED
Benzodiazepines: POSITIVE — AB
Cocaine: POSITIVE — AB
Opiates: NOT DETECTED
Tetrahydrocannabinol: NOT DETECTED

## 2012-08-14 LAB — ETHANOL: Alcohol, Ethyl (B): 119 mg/dL — ABNORMAL HIGH (ref 0–11)

## 2012-08-14 NOTE — ED Notes (Signed)
Patient upset that he is being discharged.  Patient irate and belligerent during discharge instructions.  Patient instructed to follow up with outpatient rehab.  Patient continually said "I tried to come in the right way.  If I had come in drunk or high and showing my ass, I would have gotten some help."  Patient ambulatory with steady gait; discharged in stable condition.

## 2012-08-14 NOTE — ED Provider Notes (Signed)
Dr. Jacky Kindle, telepsychiatrist, deems patient safe for discharge.   Dean Seamen, MD 08/14/12 [phone]

## 2012-08-14 NOTE — ED Notes (Signed)
Brought in for evaluation by [facility]

## 2012-08-14 NOTE — ED Notes (Signed)
States he has tried to get help and been turned away twice

## 2012-08-14 NOTE — ED Notes (Signed)
Pt denies SI/HI, states that he needs help staying clean
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
## 2012-08-14 NOTE — ED Notes (Signed)
Telepsych completed; awaiting assessment.
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
## 2012-08-15 NOTE — ED Notes (Signed)
Discharge instructions reviewed with pt, questions answered. Pt verbalized understanding.  

## 2012-09-24 DIAGNOSIS — G894 Chronic pain syndrome: Secondary | ICD-10-CM | POA: Diagnosis not present

## 2012-09-24 DIAGNOSIS — Z79899 Other long term (current) drug therapy: Secondary | ICD-10-CM | POA: Diagnosis not present

## 2012-10-02 ENCOUNTER — Emergency Department (HOSPITAL_COMMUNITY)
Admission: EM | Admit: 2012-10-02 | Discharge: 2012-10-02 | Disposition: A | Discharge status: Home / Self Care | Payer: Medicare Other | Attending: Emergency Medicine | Admitting: Emergency Medicine

## 2012-10-02 ENCOUNTER — Encounter (HOSPITAL_COMMUNITY): Payer: Self-pay

## 2012-10-02 DIAGNOSIS — G40909 Epilepsy, unspecified, not intractable, without status epilepticus: Secondary | ICD-10-CM | POA: Insufficient documentation

## 2012-10-02 DIAGNOSIS — F17200 Nicotine dependence, unspecified, uncomplicated: Secondary | ICD-10-CM | POA: Insufficient documentation

## 2012-10-02 DIAGNOSIS — R002 Palpitations: Secondary | ICD-10-CM | POA: Insufficient documentation

## 2012-10-02 DIAGNOSIS — F4310 Post-traumatic stress disorder, unspecified: Secondary | ICD-10-CM | POA: Insufficient documentation

## 2012-10-02 DIAGNOSIS — R42 Dizziness and giddiness: Secondary | ICD-10-CM | POA: Insufficient documentation

## 2012-10-02 DIAGNOSIS — Z8719 Personal history of other diseases of the digestive system: Secondary | ICD-10-CM | POA: Diagnosis not present

## 2012-10-02 DIAGNOSIS — T7840XA Allergy, unspecified, initial encounter: Secondary | ICD-10-CM

## 2012-10-02 DIAGNOSIS — Z79899 Other long term (current) drug therapy: Secondary | ICD-10-CM | POA: Insufficient documentation

## 2012-10-02 DIAGNOSIS — F319 Bipolar disorder, unspecified: Secondary | ICD-10-CM | POA: Insufficient documentation

## 2012-10-02 DIAGNOSIS — L509 Urticaria, unspecified: Secondary | ICD-10-CM

## 2012-10-02 DIAGNOSIS — L272 Dermatitis due to ingested food: Secondary | ICD-10-CM | POA: Diagnosis not present

## 2012-10-02 DIAGNOSIS — M129 Arthropathy, unspecified: Secondary | ICD-10-CM | POA: Insufficient documentation

## 2012-10-02 DIAGNOSIS — L500 Allergic urticaria: Secondary | ICD-10-CM | POA: Diagnosis not present

## 2012-10-02 MED ORDER — EPINEPHRINE 0.3 MG/0.3ML IJ SOAJ: 0.3000 mg | Freq: Once | INTRAMUSCULAR | Status: DC

## 2012-10-02 MED ORDER — DIPHENHYDRAMINE HCL 50 MG/ML IJ SOLN: 50.0000 mg | Freq: Once | INTRAMUSCULAR | Status: DC

## 2012-10-02 MED ORDER — PREDNISONE 20 MG PO TABS: 60.0000 mg | ORAL_TABLET | Freq: Every day | ORAL | Status: DC

## 2012-10-02 MED ORDER — DIPHENHYDRAMINE HCL 25 MG PO CAPS
50.0000 mg | ORAL_CAPSULE | Freq: Once | ORAL | Status: AC
  Administered 2012-10-02: 50 mg via ORAL
  Filled 2012-10-02: qty 2

## 2012-10-02 MED ORDER — FAMOTIDINE IN NACL 20-0.9 MG/50ML-% IV SOLN: 20.0000 mg | Freq: Once | INTRAVENOUS | Status: DC

## 2012-10-02 MED ORDER — METHYLPREDNISOLONE SODIUM SUCC 125 MG IJ SOLR: 125.0000 mg | Freq: Once | INTRAMUSCULAR | Status: DC

## 2012-10-02 MED ORDER — DIPHENHYDRAMINE HCL 50 MG/ML IJ SOLN: 25.0000 mg | Freq: Once | INTRAMUSCULAR | Status: DC

## 2012-10-02 MED ORDER — FAMOTIDINE 20 MG PO TABS
40.0000 mg | ORAL_TABLET | Freq: Once | ORAL | Status: AC
  Administered 2012-10-02: 40 mg via ORAL
  Filled 2012-10-02: qty 2

## 2012-10-02 MED ORDER — PREDNISONE 50 MG PO TABS
60.0000 mg | ORAL_TABLET | Freq: Once | ORAL | Status: AC
  Administered 2012-10-02: 60 mg via ORAL
  Filled 2012-10-02: qty 1

## 2012-10-02 MED ORDER — DIPHENHYDRAMINE HCL 50 MG/ML IJ SOLN
INTRAMUSCULAR | Status: AC
  Filled 2012-10-02: qty 1

## 2012-10-02 NOTE — ED Notes (Signed)
Patient with no complaints at this time. Respirations even and unlabored. Skin warm/dry. Discharge instructions reviewed with patient at this time. Patient given opportunity to voice concerns/ask questions. Patient discharged at this time and left Emergency Department with steady gait.   
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
## 2012-10-02 NOTE — ED Notes (Signed)
Pt reports [place] [employer] ago.  Reports approx 5 min after eating started breaking out in rash, feeling dizzy, AND heart racing.  Denies any SOB or difficulty swallowing.

## 2012-10-09 DIAGNOSIS — M5412 Radiculopathy, cervical region: Secondary | ICD-10-CM | POA: Diagnosis not present

## 2012-10-09 DIAGNOSIS — G40909 Epilepsy, unspecified, not intractable, without status epilepticus: Secondary | ICD-10-CM | POA: Diagnosis not present

## 2012-10-09 DIAGNOSIS — G5600 Carpal tunnel syndrome, unspecified upper limb: Secondary | ICD-10-CM | POA: Diagnosis not present

## 2012-10-09 DIAGNOSIS — Z79899 Other long term (current) drug therapy: Secondary | ICD-10-CM | POA: Diagnosis not present

## 2012-10-09 DIAGNOSIS — G894 Chronic pain syndrome: Secondary | ICD-10-CM | POA: Diagnosis not present

## 2012-10-09 DIAGNOSIS — M542 Cervicalgia: Secondary | ICD-10-CM | POA: Diagnosis not present

## 2012-10-09 DIAGNOSIS — M549 Dorsalgia, unspecified: Secondary | ICD-10-CM | POA: Diagnosis not present

## 2012-10-15 DIAGNOSIS — M5412 Radiculopathy, cervical region: Secondary | ICD-10-CM | POA: Diagnosis not present

## 2012-10-15 DIAGNOSIS — G609 Hereditary and idiopathic neuropathy, unspecified: Secondary | ICD-10-CM | POA: Diagnosis not present

## 2012-11-08 DIAGNOSIS — F4310 Post-traumatic stress disorder, unspecified: Secondary | ICD-10-CM | POA: Diagnosis not present

## 2013-01-29 DIAGNOSIS — M25519 Pain in unspecified shoulder: Secondary | ICD-10-CM | POA: Diagnosis not present

## 2013-01-29 DIAGNOSIS — M542 Cervicalgia: Secondary | ICD-10-CM | POA: Diagnosis not present

## 2013-01-29 DIAGNOSIS — Z79899 Other long term (current) drug therapy: Secondary | ICD-10-CM | POA: Diagnosis not present

## 2013-01-29 DIAGNOSIS — G894 Chronic pain syndrome: Secondary | ICD-10-CM | POA: Diagnosis not present

## 2013-01-30 DIAGNOSIS — M5412 Radiculopathy, cervical region: Secondary | ICD-10-CM | POA: Diagnosis not present

## 2013-01-30 DIAGNOSIS — G40909 Epilepsy, unspecified, not intractable, without status epilepticus: Secondary | ICD-10-CM | POA: Diagnosis not present

## 2013-01-30 DIAGNOSIS — G5600 Carpal tunnel syndrome, unspecified upper limb: Secondary | ICD-10-CM | POA: Diagnosis not present

## 2013-01-30 DIAGNOSIS — M542 Cervicalgia: Secondary | ICD-10-CM | POA: Diagnosis not present

## 2013-02-01 ENCOUNTER — Other Ambulatory Visit (HOSPITAL_COMMUNITY): Payer: Self-pay | Admitting: Nurse Practitioner

## 2013-02-01 DIAGNOSIS — M542 Cervicalgia: Secondary | ICD-10-CM

## 2013-02-01 DIAGNOSIS — G8929 Other chronic pain: Secondary | ICD-10-CM

## 2013-02-06 ENCOUNTER — Ambulatory Visit (HOSPITAL_COMMUNITY)
Admission: RE | Admit: 2013-02-06 | Discharge: 2013-02-06 | Disposition: A | Discharge status: Home / Self Care | Payer: Medicare Other | Source: Ambulatory Visit | Attending: Nurse Practitioner | Admitting: Nurse Practitioner

## 2013-02-06 DIAGNOSIS — R209 Unspecified disturbances of skin sensation: Secondary | ICD-10-CM | POA: Diagnosis not present

## 2013-02-06 DIAGNOSIS — M4802 Spinal stenosis, cervical region: Secondary | ICD-10-CM | POA: Diagnosis not present

## 2013-02-06 DIAGNOSIS — M542 Cervicalgia: Secondary | ICD-10-CM

## 2013-02-06 DIAGNOSIS — G8929 Other chronic pain: Secondary | ICD-10-CM

## 2013-02-06 DIAGNOSIS — IMO0002 Reserved for concepts with insufficient information to code with codable children: Secondary | ICD-10-CM | POA: Diagnosis not present

## 2013-02-06 DIAGNOSIS — M47812 Spondylosis without myelopathy or radiculopathy, cervical region: Secondary | ICD-10-CM | POA: Insufficient documentation

## 2013-02-06 DIAGNOSIS — M5020 Other cervical disc displacement, unspecified cervical region: Secondary | ICD-10-CM | POA: Insufficient documentation

## 2013-02-19 DIAGNOSIS — M5412 Radiculopathy, cervical region: Secondary | ICD-10-CM | POA: Diagnosis not present

## 2013-02-19 DIAGNOSIS — Z79899 Other long term (current) drug therapy: Secondary | ICD-10-CM | POA: Diagnosis not present

## 2013-02-19 DIAGNOSIS — G894 Chronic pain syndrome: Secondary | ICD-10-CM | POA: Diagnosis not present

## 2013-02-19 DIAGNOSIS — M542 Cervicalgia: Secondary | ICD-10-CM | POA: Diagnosis not present

## 2013-02-21 DIAGNOSIS — M542 Cervicalgia: Secondary | ICD-10-CM | POA: Diagnosis not present

## 2013-02-21 DIAGNOSIS — Z79899 Other long term (current) drug therapy: Secondary | ICD-10-CM | POA: Diagnosis not present

## 2013-02-21 DIAGNOSIS — G894 Chronic pain syndrome: Secondary | ICD-10-CM | POA: Diagnosis not present

## 2013-02-21 DIAGNOSIS — M25519 Pain in unspecified shoulder: Secondary | ICD-10-CM | POA: Diagnosis not present

## 2013-03-12 DIAGNOSIS — M5412 Radiculopathy, cervical region: Secondary | ICD-10-CM | POA: Diagnosis not present

## 2013-03-12 DIAGNOSIS — Z79899 Other long term (current) drug therapy: Secondary | ICD-10-CM | POA: Diagnosis not present

## 2013-03-12 DIAGNOSIS — G894 Chronic pain syndrome: Secondary | ICD-10-CM | POA: Diagnosis not present

## 2013-03-12 DIAGNOSIS — M25519 Pain in unspecified shoulder: Secondary | ICD-10-CM | POA: Diagnosis not present

## 2013-03-13 DIAGNOSIS — G894 Chronic pain syndrome: Secondary | ICD-10-CM | POA: Diagnosis not present

## 2013-03-13 DIAGNOSIS — M542 Cervicalgia: Secondary | ICD-10-CM | POA: Diagnosis not present

## 2013-03-13 DIAGNOSIS — Z79899 Other long term (current) drug therapy: Secondary | ICD-10-CM | POA: Diagnosis not present

## 2013-03-13 DIAGNOSIS — M5412 Radiculopathy, cervical region: Secondary | ICD-10-CM | POA: Diagnosis not present

## 2013-04-02 ENCOUNTER — Other Ambulatory Visit: Payer: Self-pay | Admitting: Neurosurgery

## 2013-04-02 DIAGNOSIS — M5030 Other cervical disc degeneration, unspecified cervical region: Secondary | ICD-10-CM | POA: Diagnosis not present

## 2013-04-02 DIAGNOSIS — M5020 Other cervical disc displacement, unspecified cervical region: Secondary | ICD-10-CM | POA: Diagnosis not present

## 2013-04-02 DIAGNOSIS — M47812 Spondylosis without myelopathy or radiculopathy, cervical region: Secondary | ICD-10-CM

## 2013-04-02 DIAGNOSIS — Z6834 Body mass index (BMI) 34.0-34.9, adult: Secondary | ICD-10-CM | POA: Diagnosis not present

## 2013-04-02 DIAGNOSIS — M542 Cervicalgia: Secondary | ICD-10-CM | POA: Diagnosis not present

## 2013-04-11 ENCOUNTER — Other Ambulatory Visit: Payer: Medicare Other

## 2013-04-18 DIAGNOSIS — M25519 Pain in unspecified shoulder: Secondary | ICD-10-CM | POA: Diagnosis not present

## 2013-04-18 DIAGNOSIS — M5412 Radiculopathy, cervical region: Secondary | ICD-10-CM | POA: Diagnosis not present

## 2013-04-18 DIAGNOSIS — G894 Chronic pain syndrome: Secondary | ICD-10-CM | POA: Diagnosis not present

## 2013-04-18 DIAGNOSIS — Z79899 Other long term (current) drug therapy: Secondary | ICD-10-CM | POA: Diagnosis not present

## 2013-04-19 DIAGNOSIS — Z79899 Other long term (current) drug therapy: Secondary | ICD-10-CM | POA: Diagnosis not present

## 2013-04-19 DIAGNOSIS — G894 Chronic pain syndrome: Secondary | ICD-10-CM | POA: Diagnosis not present

## 2013-04-22 ENCOUNTER — Other Ambulatory Visit: Payer: Medicare Other

## 2013-04-22 ENCOUNTER — Ambulatory Visit
Admission: RE | Admit: 2013-04-22 | Discharge: 2013-04-22 | Disposition: A | Discharge status: Home / Self Care | Payer: Medicare Other | Source: Ambulatory Visit | Attending: Neurosurgery | Admitting: Neurosurgery

## 2013-04-22 ENCOUNTER — Other Ambulatory Visit: Payer: Self-pay | Admitting: Neurosurgery

## 2013-04-22 DIAGNOSIS — M47812 Spondylosis without myelopathy or radiculopathy, cervical region: Secondary | ICD-10-CM

## 2013-04-22 DIAGNOSIS — M5030 Other cervical disc degeneration, unspecified cervical region: Secondary | ICD-10-CM | POA: Diagnosis not present

## 2013-04-24 DIAGNOSIS — M5412 Radiculopathy, cervical region: Secondary | ICD-10-CM | POA: Diagnosis not present

## 2013-04-24 DIAGNOSIS — M5020 Other cervical disc displacement, unspecified cervical region: Secondary | ICD-10-CM | POA: Diagnosis not present

## 2013-04-24 DIAGNOSIS — M47812 Spondylosis without myelopathy or radiculopathy, cervical region: Secondary | ICD-10-CM | POA: Diagnosis not present

## 2013-04-24 DIAGNOSIS — M5030 Other cervical disc degeneration, unspecified cervical region: Secondary | ICD-10-CM | POA: Diagnosis not present

## 2013-04-25 ENCOUNTER — Other Ambulatory Visit: Payer: Self-pay | Admitting: Neurosurgery

## 2013-04-25 DIAGNOSIS — M47812 Spondylosis without myelopathy or radiculopathy, cervical region: Secondary | ICD-10-CM

## 2013-04-29 ENCOUNTER — Other Ambulatory Visit: Payer: Medicare Other

## 2013-04-29 DIAGNOSIS — F411 Generalized anxiety disorder: Secondary | ICD-10-CM | POA: Diagnosis not present

## 2013-04-29 DIAGNOSIS — N400 Enlarged prostate without lower urinary tract symptoms: Secondary | ICD-10-CM | POA: Diagnosis not present

## 2013-04-30 ENCOUNTER — Other Ambulatory Visit (HOSPITAL_COMMUNITY): Payer: Self-pay | Admitting: Pulmonary Disease

## 2013-04-30 DIAGNOSIS — R599 Enlarged lymph nodes, unspecified: Secondary | ICD-10-CM | POA: Diagnosis not present

## 2013-05-02 ENCOUNTER — Ambulatory Visit (HOSPITAL_COMMUNITY)
Admission: RE | Admit: 2013-05-02 | Discharge: 2013-05-02 | Disposition: A | Discharge status: Home / Self Care | Payer: Medicare Other | Source: Ambulatory Visit | Attending: Pulmonary Disease | Admitting: Pulmonary Disease

## 2013-05-02 ENCOUNTER — Encounter (HOSPITAL_COMMUNITY): Payer: Self-pay

## 2013-05-02 DIAGNOSIS — R599 Enlarged lymph nodes, unspecified: Secondary | ICD-10-CM

## 2013-05-02 DIAGNOSIS — R079 Chest pain, unspecified: Secondary | ICD-10-CM | POA: Diagnosis not present

## 2013-05-02 DIAGNOSIS — R911 Solitary pulmonary nodule: Secondary | ICD-10-CM | POA: Diagnosis not present

## 2013-05-02 DIAGNOSIS — K7689 Other specified diseases of liver: Secondary | ICD-10-CM | POA: Insufficient documentation

## 2013-05-02 DIAGNOSIS — R918 Other nonspecific abnormal finding of lung field: Secondary | ICD-10-CM | POA: Diagnosis not present

## 2013-05-02 MED ORDER — IOHEXOL 300 MG/ML  SOLN
80.0000 mL | Freq: Once | INTRAMUSCULAR | Status: AC | PRN
  Administered 2013-05-02: 80 mL via INTRAVENOUS

## 2013-05-06 ENCOUNTER — Ambulatory Visit
Admission: RE | Admit: 2013-05-06 | Discharge: 2013-05-06 | Disposition: A | Discharge status: Home / Self Care | Payer: Medicare Other | Source: Ambulatory Visit | Attending: Neurosurgery | Admitting: Neurosurgery

## 2013-05-06 DIAGNOSIS — M542 Cervicalgia: Secondary | ICD-10-CM | POA: Diagnosis not present

## 2013-05-06 DIAGNOSIS — M47812 Spondylosis without myelopathy or radiculopathy, cervical region: Secondary | ICD-10-CM

## 2013-05-06 MED ORDER — DEXAMETHASONE SODIUM PHOSPHATE 4 MG/ML IJ SOLN: 4.0000 mg | Freq: Once | INTRAMUSCULAR | Status: DC

## 2013-05-06 MED ORDER — IOHEXOL 300 MG/ML  SOLN: 1.0000 mL | Freq: Once | INTRAMUSCULAR | Status: AC | PRN

## 2013-05-06 NOTE — Discharge Instructions (Signed)

## 2013-05-08 DIAGNOSIS — R5383 Other fatigue: Secondary | ICD-10-CM | POA: Diagnosis not present

## 2013-05-08 DIAGNOSIS — R5381 Other malaise: Secondary | ICD-10-CM | POA: Diagnosis not present

## 2013-05-08 DIAGNOSIS — R6889 Other general symptoms and signs: Secondary | ICD-10-CM | POA: Diagnosis not present

## 2013-05-08 DIAGNOSIS — R404 Transient alteration of awareness: Secondary | ICD-10-CM | POA: Diagnosis not present

## 2013-06-12 DIAGNOSIS — R300 Dysuria: Secondary | ICD-10-CM | POA: Diagnosis not present

## 2013-06-12 DIAGNOSIS — M545 Low back pain, unspecified: Secondary | ICD-10-CM | POA: Diagnosis not present

## 2013-06-12 DIAGNOSIS — F411 Generalized anxiety disorder: Secondary | ICD-10-CM | POA: Diagnosis not present

## 2013-06-13 ENCOUNTER — Other Ambulatory Visit: Payer: Self-pay | Admitting: Pulmonary Disease

## 2013-06-13 DIAGNOSIS — M542 Cervicalgia: Secondary | ICD-10-CM

## 2013-06-18 ENCOUNTER — Ambulatory Visit
Admission: RE | Admit: 2013-06-18 | Discharge: 2013-06-18 | Disposition: A | Discharge status: Home / Self Care | Payer: Medicare Other | Source: Ambulatory Visit | Attending: Pulmonary Disease | Admitting: Pulmonary Disease

## 2013-06-18 DIAGNOSIS — M542 Cervicalgia: Secondary | ICD-10-CM

## 2013-06-18 DIAGNOSIS — M5020 Other cervical disc displacement, unspecified cervical region: Secondary | ICD-10-CM | POA: Diagnosis not present

## 2013-06-18 MED ORDER — TRIAMCINOLONE ACETONIDE 40 MG/ML IJ SUSP (RADIOLOGY)
60.0000 mg | Freq: Once | INTRAMUSCULAR | Status: AC
  Administered 2013-06-18: 60 mg via EPIDURAL

## 2013-06-18 MED ORDER — IOHEXOL 300 MG/ML  SOLN
1.0000 mL | Freq: Once | INTRAMUSCULAR | Status: AC | PRN
  Administered 2013-06-18: 1 mL via EPIDURAL

## 2013-06-25 DIAGNOSIS — M5412 Radiculopathy, cervical region: Secondary | ICD-10-CM | POA: Diagnosis not present

## 2013-06-25 DIAGNOSIS — M5020 Other cervical disc displacement, unspecified cervical region: Secondary | ICD-10-CM | POA: Diagnosis not present

## 2013-06-25 DIAGNOSIS — M5030 Other cervical disc degeneration, unspecified cervical region: Secondary | ICD-10-CM | POA: Diagnosis not present

## 2013-06-25 DIAGNOSIS — M47812 Spondylosis without myelopathy or radiculopathy, cervical region: Secondary | ICD-10-CM | POA: Diagnosis not present

## 2013-07-09 ENCOUNTER — Ambulatory Visit: Payer: Medicare Other | Admitting: Urology

## 2013-07-18 DIAGNOSIS — R030 Elevated blood-pressure reading, without diagnosis of hypertension: Secondary | ICD-10-CM | POA: Diagnosis not present

## 2013-07-18 DIAGNOSIS — Z79899 Other long term (current) drug therapy: Secondary | ICD-10-CM | POA: Diagnosis not present

## 2013-07-31 DIAGNOSIS — R030 Elevated blood-pressure reading, without diagnosis of hypertension: Secondary | ICD-10-CM | POA: Diagnosis not present

## 2013-07-31 DIAGNOSIS — Z79899 Other long term (current) drug therapy: Secondary | ICD-10-CM | POA: Diagnosis not present

## 2013-07-31 DIAGNOSIS — G894 Chronic pain syndrome: Secondary | ICD-10-CM | POA: Diagnosis not present

## 2013-08-06 DIAGNOSIS — R52 Pain, unspecified: Secondary | ICD-10-CM | POA: Diagnosis not present

## 2013-08-06 DIAGNOSIS — R0609 Other forms of dyspnea: Secondary | ICD-10-CM | POA: Diagnosis not present

## 2013-09-04 ENCOUNTER — Encounter (HOSPITAL_COMMUNITY): Payer: Self-pay | Admitting: Emergency Medicine

## 2013-09-04 ENCOUNTER — Emergency Department (HOSPITAL_COMMUNITY)
Admission: EM | Admit: 2013-09-04 | Discharge: 2013-09-04 | Disposition: A | Discharge status: Home / Self Care | Payer: Medicare Other | Attending: Emergency Medicine | Admitting: Emergency Medicine

## 2013-09-04 DIAGNOSIS — Z202 Contact with and (suspected) exposure to infections with a predominantly sexual mode of transmission: Secondary | ICD-10-CM | POA: Diagnosis not present

## 2013-09-04 DIAGNOSIS — F4310 Post-traumatic stress disorder, unspecified: Secondary | ICD-10-CM | POA: Diagnosis not present

## 2013-09-04 DIAGNOSIS — F17200 Nicotine dependence, unspecified, uncomplicated: Secondary | ICD-10-CM | POA: Insufficient documentation

## 2013-09-04 DIAGNOSIS — G40909 Epilepsy, unspecified, not intractable, without status epilepticus: Secondary | ICD-10-CM | POA: Insufficient documentation

## 2013-09-04 DIAGNOSIS — Z79899 Other long term (current) drug therapy: Secondary | ICD-10-CM | POA: Diagnosis not present

## 2013-09-04 DIAGNOSIS — A599 Trichomoniasis, unspecified: Secondary | ICD-10-CM | POA: Diagnosis not present

## 2013-09-04 DIAGNOSIS — F319 Bipolar disorder, unspecified: Secondary | ICD-10-CM | POA: Insufficient documentation

## 2013-09-04 DIAGNOSIS — Z792 Long term (current) use of antibiotics: Secondary | ICD-10-CM | POA: Diagnosis not present

## 2013-09-04 DIAGNOSIS — M129 Arthropathy, unspecified: Secondary | ICD-10-CM | POA: Diagnosis not present

## 2013-09-04 DIAGNOSIS — Z791 Long term (current) use of non-steroidal anti-inflammatories (NSAID): Secondary | ICD-10-CM | POA: Insufficient documentation

## 2013-09-04 DIAGNOSIS — Z87828 Personal history of other (healed) physical injury and trauma: Secondary | ICD-10-CM | POA: Insufficient documentation

## 2013-09-04 MED ORDER — METRONIDAZOLE 500 MG PO TABS: 500.0000 mg | ORAL_TABLET | Freq: Two times a day (BID) | ORAL | Status: DC

## 2013-09-04 NOTE — ED Notes (Signed)
PT was told by his girlfriend she was treated trich. And he wants tx. PT denies any symptoms.

## 2013-09-04 NOTE — Discharge Instructions (Signed)
Trichomonas Test The trichomonas test is done to diagnose trichomoniasis, an infection caused by an organism called Trichomonas. Trichomoniasis is a sexually transmitted infection (STI). In women, it causes vaginal infections. In men, it can cause the tube that carries urine (urethra) to become inflamed (urethritis). You may have this test as a part of a routine screening for STIs or if you have symptoms of trichomoniasis. To perform the test, your health care provider will take a sample of discharge. The sample is taken from the vagina or cervix in women and from the urethra in men. A urine sample can also be used for testing. RESULTS It is your responsibility to obtain your test results. Ask the lab or department performing the test when and how you will get your results. Contact your health care provider to discuss any questions you have about your results.  Meaning of Negative Test Results A negative test means you do not have trichomoniasis. Follow your health care provider's directions about any follow-up testing.  Meaning of Positive Test Results A positive test result means you have an active infection that needs to be treated with antibiotic medicine. All your current sexual partners must also be treated or it is likely you will get reinfected.  If your test is positive, your health care provider will start you on medicine and may advise you to:  Not have sexual intercourse until your infection has cleared up.  Use a latex condom properly every time you have sexual intercourse.  Limit the number of sexual partners you have. The more partners you have, the greater your risk of contracting trichomoniasis or another STI.  Tell all sexual partners about your infection so they can also be treated and to prevent reinfection. Document Released: 01/30/2004 Document Revised: 05/13/2013 Document Reviewed: 01/08/2013 [facility] Patient Information 2015 [place], [place]. This information is not  intended to replace advice given to you by your health care provider. Make sure you discuss any questions you have with your health care provider.  Safe Sex Safe sex is about reducing the risk of giving or getting a sexually transmitted disease (STD). STDs are spread through sexual contact involving the genitals, mouth, or rectum. Some STDs can be cured and others cannot. Safe sex can also prevent unintended pregnancies.  WHAT ARE SOME SAFE SEX PRACTICES?  Limit your sexual activity to only one partner who is having sex with only you.  Talk to your partner about his or her past partners, past STDs, and drug use.  Use a condom every time you have sexual intercourse. This includes vaginal, oral, and anal sexual activity. Both females and males should wear condoms during oral sex. Only use latex or polyurethane condoms and water-based lubricants. Using petroleum-based lubricants or oils to lubricate a condom will weaken the condom and increase the chance that it will break. The condom should be in place from the beginning to the end of sexual activity. Wearing a condom reduces, but does not completely eliminate, your risk of getting or giving an STD. STDs can be spread by contact with infected body fluids and skin.  Get vaccinated for hepatitis B and HPV.  Avoid alcohol and recreational drugs, which can affect your judgment. You may forget to use a condom or participate in high-risk sex.  For females, avoid douching after sexual intercourse. Douching can spread an infection farther into the reproductive tract.  Check your body for signs of sores, blisters, rashes, or unusual discharge. See your health care provider if you notice  any of these signs.  Avoid sexual contact if you have symptoms of an infection or are being treated for an STD. If you or your partner has herpes, avoid sexual contact when blisters are present. Use condoms at all other times.  If you are at risk of being infected with  HIV, it is recommended that you take a prescription medicine daily to prevent HIV infection. This is called pre-exposure prophylaxis (PrEP). You are considered at risk if:  You are a man who has sex with other men (MSM).  You are a heterosexual man or woman who is sexually active with more than one partner.  You take drugs by injection.  You are sexually active with a partner who has HIV.  Talk with your health care provider about whether you are at high risk of being infected with HIV. If you choose to begin PrEP, you should first be tested for HIV. You should then be tested every 3 months for as long as you are taking PrEP.  See your health care provider for regular screenings, exams, and tests for other STDs. Before having sex with a new partner, each of you should be screened for STDs and should talk about the results with each other. WHAT ARE THE BENEFITS OF SAFE SEX?   There is less chance of getting or giving an STD.  You can prevent unwanted or unintended pregnancies.  By discussing safe sex concerns with your partner, you may increase feelings of intimacy, comfort, trust, and honesty between the two of you. Document Released: 02/04/2004 Document Revised: 05/13/2013 Document Reviewed: 06/20/2011 [facility] Patient Information 2015 [place], [place]. This information is not intended to replace advice given to you by your health care provider. Make sure you discuss any questions you have with your health care provider.
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
## 2013-09-06 NOTE — ED Provider Notes (Signed)
Medical screening examination/treatment/procedure(s) were performed by non-physician practitioner and as supervising physician I was immediately available for consultation/collaboration.    Chucky Homes, MD 09/06/13 1331 

## 2013-10-22 ENCOUNTER — Ambulatory Visit: Payer: Medicare Other | Admitting: Urology

## 2013-11-06 DIAGNOSIS — R52 Pain, unspecified: Secondary | ICD-10-CM | POA: Diagnosis not present

## 2013-11-10 ENCOUNTER — Encounter (HOSPITAL_COMMUNITY): Payer: Self-pay

## 2013-11-10 ENCOUNTER — Emergency Department (HOSPITAL_COMMUNITY): Payer: Medicare Other

## 2013-11-10 ENCOUNTER — Emergency Department (HOSPITAL_COMMUNITY)
Admission: EM | Admit: 2013-11-10 | Discharge: 2013-11-10 | Disposition: A | Discharge status: Home / Self Care | Payer: Medicare Other | Attending: Emergency Medicine | Admitting: Emergency Medicine

## 2013-11-10 DIAGNOSIS — Z792 Long term (current) use of antibiotics: Secondary | ICD-10-CM | POA: Diagnosis not present

## 2013-11-10 DIAGNOSIS — G43909 Migraine, unspecified, not intractable, without status migrainosus: Secondary | ICD-10-CM | POA: Insufficient documentation

## 2013-11-10 DIAGNOSIS — Z720 Tobacco use: Secondary | ICD-10-CM | POA: Diagnosis not present

## 2013-11-10 DIAGNOSIS — Z79899 Other long term (current) drug therapy: Secondary | ICD-10-CM | POA: Insufficient documentation

## 2013-11-10 DIAGNOSIS — Z8781 Personal history of (healed) traumatic fracture: Secondary | ICD-10-CM | POA: Diagnosis not present

## 2013-11-10 DIAGNOSIS — Z791 Long term (current) use of non-steroidal anti-inflammatories (NSAID): Secondary | ICD-10-CM | POA: Diagnosis not present

## 2013-11-10 DIAGNOSIS — K088 Other specified disorders of teeth and supporting structures: Secondary | ICD-10-CM | POA: Diagnosis not present

## 2013-11-10 DIAGNOSIS — K0889 Other specified disorders of teeth and supporting structures: Secondary | ICD-10-CM

## 2013-11-10 DIAGNOSIS — F319 Bipolar disorder, unspecified: Secondary | ICD-10-CM | POA: Insufficient documentation

## 2013-11-10 DIAGNOSIS — R079 Chest pain, unspecified: Secondary | ICD-10-CM | POA: Insufficient documentation

## 2013-11-10 DIAGNOSIS — R0789 Other chest pain: Secondary | ICD-10-CM | POA: Diagnosis not present

## 2013-11-10 DIAGNOSIS — M1990 Unspecified osteoarthritis, unspecified site: Secondary | ICD-10-CM | POA: Diagnosis not present

## 2013-11-10 DIAGNOSIS — S299XXA Unspecified injury of thorax, initial encounter: Secondary | ICD-10-CM | POA: Diagnosis not present

## 2013-11-10 LAB — CBC WITH DIFFERENTIAL/PLATELET
Basophils Absolute: 0.1 10*3/uL (ref 0.0–0.1)
Basophils Relative: 1 % (ref 0–1)
Eosinophils Absolute: 0 10*3/uL (ref 0.0–0.7)
Eosinophils Relative: 1 % (ref 0–5)
HCT: 43.2 % (ref 39.0–52.0)
Hemoglobin: 14.9 g/dL (ref 13.0–17.0)
Lymphocytes Relative: 30 % (ref 12–46)
Lymphs Abs: 1.7 10*3/uL (ref 0.7–4.0)
MCH: 31.1 pg (ref 26.0–34.0)
MCHC: 34.5 g/dL (ref 30.0–36.0)
MCV: 90.2 fL (ref 78.0–100.0)
Monocytes Absolute: 0.2 10*3/uL (ref 0.1–1.0)
Monocytes Relative: 4 % (ref 3–12)
Neutro Abs: 3.7 10*3/uL (ref 1.7–7.7)
Neutrophils Relative %: 64 % (ref 43–77)
Platelets: 149 10*3/uL — ABNORMAL LOW (ref 150–400)
RBC: 4.79 MIL/uL (ref 4.22–5.81)
RDW: 12.3 % (ref 11.5–15.5)
WBC: 5.7 10*3/uL (ref 4.0–10.5)

## 2013-11-10 LAB — BASIC METABOLIC PANEL
Anion gap: 17 — ABNORMAL HIGH (ref 5–15)
BUN: 5 mg/dL — ABNORMAL LOW (ref 6–23)
CO2: 26 mEq/L (ref 19–32)
Calcium: 7.7 mg/dL — ABNORMAL LOW (ref 8.4–10.5)
Chloride: 97 mEq/L (ref 96–112)
Creatinine, Ser: 0.71 mg/dL (ref 0.50–1.35)
GFR calc Af Amer: >90 mL/min (ref >90)
GFR calc non Af Amer: >90 mL/min (ref >90)
Glucose, Bld: 95 mg/dL (ref 70–99)
Potassium: 3.7 mEq/L (ref 3.7–5.3)
Sodium: 140 mEq/L (ref 137–147)

## 2013-11-10 LAB — TROPONIN I: Troponin I: <0.3 ng/mL (ref <0.30)

## 2013-11-10 MED ORDER — OXYCODONE-ACETAMINOPHEN 5-325 MG PO TABS: 1.0000 | ORAL_TABLET | ORAL | Status: DC | PRN

## 2013-11-10 MED ORDER — AMOXICILLIN 500 MG PO CAPS: 1000.0000 mg | ORAL_CAPSULE | Freq: Two times a day (BID) | ORAL | Status: DC

## 2013-11-10 MED ORDER — OXYCODONE-ACETAMINOPHEN 5-325 MG PO TABS
1.0000 | ORAL_TABLET | Freq: Once | ORAL | Status: AC
  Administered 2013-11-10: 1 via ORAL
  Filled 2013-11-10: qty 1

## 2013-11-10 MED ORDER — AMOXICILLIN 250 MG PO CAPS
1000.0000 mg | ORAL_CAPSULE | Freq: Once | ORAL | Status: AC
  Administered 2013-11-10: 1000 mg via ORAL
  Filled 2013-11-10: qty 4

## 2013-11-10 MED ORDER — ASPIRIN 81 MG PO CHEW
324.0000 mg | CHEWABLE_TABLET | Freq: Once | ORAL | Status: AC
  Administered 2013-11-10: 324 mg via ORAL
  Filled 2013-11-10: qty 4

## 2013-11-10 NOTE — ED Notes (Signed)
Pt verbalized understanding of d/c instructions.  Verified with Frazier RichardsJ. Cruise RN  Esig pad not working.

## 2013-11-10 NOTE — ED Notes (Signed)
Woke up yesterday with pain left chest. Has had a cough for 2 days. Also had dental pain left lower widsdom tooth.

## 2013-11-10 NOTE — Discharge Instructions (Signed)
You need to see a dentist to take care of your teeth. A dentist may need to refer you to an oral surgeon.   Chest Pain (Nonspecific) It is often hard to give a specific diagnosis for the cause of chest pain. There is always a chance that your pain could be related to something serious, such as a heart attack or a blood clot in the lungs. You need to follow up with your health care provider for further evaluation. CAUSES   Heartburn.  Pneumonia or bronchitis.  Anxiety or stress.  Inflammation around your heart (pericarditis) or lung (pleuritis or pleurisy).  A blood clot in the lung.  A collapsed lung (pneumothorax). It can develop suddenly on its own (spontaneous pneumothorax) or from trauma to the chest.  Shingles infection (herpes zoster virus). The chest wall is composed of bones, muscles, and cartilage. Any of these can be the source of the pain.  The bones can be bruised by injury.  The muscles or cartilage can be strained by coughing or overwork.  The cartilage can be affected by inflammation and become sore (costochondritis). DIAGNOSIS  Lab tests or other studies may be needed to find the cause of your pain. Your health care provider may have you take a test called an ambulatory electrocardiogram (ECG). An ECG records your heartbeat patterns over a 24-hour period. You may also have other tests, such as:  Transthoracic echocardiogram (TTE). During echocardiography, sound waves are used to evaluate how blood flows through your heart.  Transesophageal echocardiogram (TEE).  Cardiac monitoring. This allows your health care provider to monitor your heart rate and rhythm in real time.  Holter monitor. This is a portable device that records your heartbeat and can help diagnose heart arrhythmias. It allows your health care provider to track your heart activity for several days, if needed.  Stress tests by exercise or by giving medicine that makes the heart beat faster. TREATMENT    Treatment depends on what may be causing your chest pain. Treatment may include:  Acid blockers for heartburn.  Anti-inflammatory medicine.  Pain medicine for inflammatory conditions.  Antibiotics if an infection is present.  You may be advised to change lifestyle habits. This includes stopping smoking and avoiding alcohol, caffeine, and chocolate.  You may be advised to keep your head raised (elevated) when sleeping. This reduces the chance of acid going backward from your stomach into your esophagus. Most of the time, nonspecific chest pain will improve within 2-3 days with rest and mild pain medicine.  HOME CARE INSTRUCTIONS   If antibiotics were prescribed, take them as directed. Finish them even if you start to feel better.  For the next few days, avoid physical activities that bring on chest pain. Continue physical activities as directed.  Do not use any tobacco products, including cigarettes, chewing tobacco, or electronic cigarettes.  Avoid drinking alcohol.  Only take medicine as directed by your health care provider.  Follow your health care provider's suggestions for further testing if your chest pain does not go away.  Keep any follow-up appointments you made. If you do not go to an appointment, you could develop lasting (chronic) problems with pain. If there is any problem keeping an appointment, call to reschedule. SEEK MEDICAL CARE IF:   Your chest pain does not go away, even after treatment.  You have a rash with blisters on your chest.  You have a fever. SEEK IMMEDIATE MEDICAL CARE IF:   You have increased chest pain or pain  that spreads to your arm, neck, jaw, back, or abdomen.  You have shortness of breath.  You have an increasing cough, or you cough up blood.  You have severe back or abdominal pain.  You feel nauseous or vomit.  You have severe weakness.  You faint.  You have chills. This is an emergency. Do not wait to see if the pain will  go away. Get medical help at once. Call your local emergency services (911 in [place] Do not drive yourself to the hospital. MAKE SURE YOU:   Understand these instructions.  Will watch your condition.  Will get help right away if you are not doing well or get worse. Document Released: 10/06/2004 Document Revised: 01/01/2013 Document Reviewed: 08/02/2007 [facility] Patient Information 2015 [place], [place]. This information is not intended to replace advice given to you by your health care provider. Make sure you discuss any questions you have with your health care provider.  Dental Pain A tooth ache may be caused by cavities (tooth decay). Cavities expose the nerve of the tooth to air and hot or cold temperatures. It may come from an infection or abscess (also called a boil or furuncle) around your tooth. It is also often caused by dental caries (tooth decay). This causes the pain you are having. DIAGNOSIS  Your caregiver can diagnose this problem by exam. TREATMENT   If caused by an infection, it may be treated with medications which kill germs (antibiotics) and pain medications as prescribed by your caregiver. Take medications as directed.  Only take over-the-counter or prescription medicines for pain, discomfort, or fever as directed by your caregiver.  Whether the tooth ache today is caused by infection or dental disease, you should see your dentist as soon as possible for further care. SEEK MEDICAL CARE IF: The exam and treatment you received today has been provided on an emergency basis only. This is not a substitute for complete medical or dental care. If your problem worsens or new problems (symptoms) appear, and you are unable to meet with your dentist, call or return to this location. SEEK IMMEDIATE MEDICAL CARE IF:   You have a fever.  You develop redness and swelling of your face, jaw, or neck.  You are unable to open your mouth.  You have severe pain uncontrolled by pain  medicine. MAKE SURE YOU:   Understand these instructions.  Will watch your condition.  Will get help right away if you are not doing well or get worse. Document Released: 12/27/2004 Document Revised: 03/21/2011 Document Reviewed: 08/15/2007 [facility] Patient Information 2015 [place], [place]. This information is not intended to replace advice given to you by your health care provider. Make sure you discuss any questions you have with your health care provider.  Amoxicillin capsules or tablets What is this medicine? AMOXICILLIN (a mox i SIL in) is a penicillin antibiotic. It is used to treat certain kinds of bacterial infections. It will not work for colds, flu, or other viral infections. This medicine may be used for other purposes; ask your health care provider or pharmacist if you have questions. COMMON BRAND NAME(S): Amoxil, Moxilin, Sumox, Trimox What should I tell my health care provider before I take this medicine? They need to know if you have any of these conditions: -asthma -kidney disease -an unusual or allergic reaction to amoxicillin, other penicillins, cephalosporin antibiotics, other medicines, foods, dyes, or preservatives -pregnant or trying to get pregnant -breast-feeding How should I use this medicine? Take this medicine by mouth with  a glass of water. Follow the directions on your prescription label. You may take this medicine with food or on an empty stomach. Take your medicine at regular intervals. Do not take your medicine more often than directed. Take all of your medicine as directed even if you think your are better. Do not skip doses or stop your medicine early. Talk to your pediatrician regarding the use of this medicine in children. While this drug may be prescribed for selected conditions, precautions do apply. Overdosage: If you think you have taken too much of this medicine contact a poison control center or emergency room at once. NOTE: This medicine is only  for you. Do not share this medicine with others. What if I miss a dose? If you miss a dose, take it as soon as you can. If it is almost time for your next dose, take only that dose. Do not take double or extra doses. What may interact with this medicine? -amiloride -birth control pills -chloramphenicol -macrolides -probenecid -sulfonamides -tetracyclines This list may not describe all possible interactions. Give your health care provider a list of all the medicines, herbs, non-prescription drugs, or dietary supplements you use. Also tell them if you smoke, drink alcohol, or use illegal drugs. Some items may interact with your medicine. What should I watch for while using this medicine? Tell your doctor or health care professional if your symptoms do not improve in 2 or 3 days. Take all of the doses of your medicine as directed. Do not skip doses or stop your medicine early. If you are diabetic, you may get a false positive result for sugar in your urine with certain brands of urine tests. Check with your doctor. Do not treat diarrhea with over-the-counter products. Contact your doctor if you have diarrhea that lasts more than 2 days or if the diarrhea is severe and watery. What side effects may I notice from receiving this medicine? Side effects that you should report to your doctor or health care professional as soon as possible: -allergic reactions like skin rash, itching or hives, swelling of the face, lips, or tongue -breathing problems -dark urine -redness, blistering, peeling or loosening of the skin, including inside the mouth -seizures -severe or watery diarrhea -trouble passing urine or change in the amount of urine -unusual bleeding or bruising -unusually weak or tired -yellowing of the eyes or skin Side effects that usually do not require medical attention (report to your doctor or health care professional if they continue or are bothersome): -dizziness -headache -stomach  upset -trouble sleeping This list may not describe all possible side effects. Call your doctor for medical advice about side effects. You may report side effects to FDA at 1-800-FDA-1088. Where should I keep my medicine? Keep out of the reach of children. Store between 68 and 77 degrees F (20 and 25 degrees C). Keep bottle closed tightly. Throw away any unused medicine after the expiration date. NOTE: This sheet is a summary. It may not cover all possible information. If you have questions about this medicine, talk to your doctor, pharmacist, or health care provider.  2015, Elsevier/Gold Standard. (2007-03-20 14:10:59)   Emergency Department Resource Guide Dental Care: Organization         Address  Phone  Notes  [facility] [address], [place] [phone] Accepts children up to age 34 who are enrolled in [place] or [place] Health Choice; pregnant women with a Medicaid card; and children  who have applied for Medicaid or [place] Health Choice, but were declined, whose parents can pay a reduced fee at time of service.  [facility] [place]  [address], [place] [phone] Accepts children up to age 34 who are enrolled in [place] or [place] Health Choice; pregnant women with a Medicaid card; and children who have applied for Medicaid or  Health Choice, but were declined, whose parents can pay a reduced fee at time of service.  [facility]  [address], [place] [phone] Patients are seen by appointment only. Walk-ins are not accepted. [facility] will see patients 34 years of age and older. Monday - Tuesday (8am-5pm) Most Wednesdays (8:30-5pm) $30 per visit, cash only  [facility]  [address], [facility] [place] [place] [phone] Patients are seen by appointment only. Walk-ins are not accepted. [facility] will see  patients 34 years of age and older. One Wednesday Evening (Monthly: Volunteer Based).  $30 per visit, cash only  [facility]  [phone] for adults; Children under age 594, call Graduate Pediatric Dentistry at [phone]. Children aged 604-14, please call [phone] to request a pediatric application.  Dental services are provided in all areas of dental care including fillings, crowns and bridges, complete and partial dentures, implants, gum treatment, root canals, and extractions. Preventive care is also provided. Treatment is provided to both adults and children. Patients are selected via a lottery and there is often a waiting list.   [facility] [address], [place]  [phone] [email or web address]   [facility] [address], [place], [place] [phone], Ext. [phone] Second and Fourth Thursday of each month, opens at 6:30 AM; Clinic ends at 9 AM.  Patients are seen on a first-come first-served basis, and a limited number are seen during each clinic.   [facility]  [address], [place], [place] [phone]) [phone]   Eligibility Requirements You must have lived in [place], [place], or [place] counties for at least the last three months.   You cannot be eligible for state or federal sponsored [place]healthcare insurance, including [employer]eterans [employer], [place]icaid, or [employer]icare.   You generally cannot be eligible for healthcare insurance through your employer.    How to apply: Eligibility screenings are held every Tuesday and Wednesday afternoon from 1:00 pm until 4:00 pm. You do not need an appointment for the interview!  [facility] [address], [place], [place] [phone]   [facility]  [phone]   [facility]  [phone]   [facility]  [phone]    Behavioral Health Resources in the Community: Intensive Outpatient  Programs Organization         Address  Phone  Notes  [facility] [address], [place], [place] [phone]   [facility] [address], [place], [place] [phone]   [facility] [address], [place], [place]  [phone]   [facility] [address],  [place], [place] [phone] or [phone]   Substance Abuse Resources Organization         Address  Phone  Notes  [facility]  [phone]   [facility]  [phone]   [facility]  [phone]   [facility]  [phone]   Residential &  Outpatient Substance Abuse Program  [phone]   Psychological Services Organization         Address  Phone  Notes  [facility]  [phone]   [facility]  [phone]   [facility] [phone] [address] [place] [phone] or [phone]    Mobile Crisis Teams Organization         Address  Phone  Notes  [facility], Mobile Crisis Care Unit  [phone]   [facility]  [address] [place], [place] [phone]   Doristine Locks [address] [place] [phone]    Self-Help/Support Groups Organization         Address  Phone             Notes  [facility] [place] - variety of support groups  [phone]- I7437963 Call for more information  Narcotics Anonymous (NA), [facility] [address], [employer] [place]  2 meetings at this location   Statistician         Address  Phone  Notes  [facility] [address],    [place]  [phone]   [facility]  [address], [place] [address], [place], [place] [phone]   [facility] [address] [place], [place] [phone] Admissions: 8am-3pm M-F  [facility] [address]    [place], [place]  [phone]   [facility] [address], [place], [place] [phone]   [facility] [address],  [place], [place] [phone]   [facility] - Intensive Outpatient [address], [place], [place] [phone]   [facility] ([facility] [address]  [place], [place] [phone] or [phone]   [facility] ([facility]) [address] [place], [place] [phone] Accepts Medicaid  [facility] [address],  [place] [phone] Substance Abuse/Addiction Treatment   [facility] Organization         Address  Phone  Notes  [facility]  [phone]   Angie Fava, PhD [address] [place], [place]   [phone] or [phone]   [facility]   [address] [place], [place] [phone]   [facility] [address] [place], [place] [phone] Insurance/Medicaid/sponsorship through [facility] and Families [address]                                    [place], [place] [phone] Therapy/tele-psych/case  [facility] [address], [place] [phone]    Dr. Lolly Mustache  [phone]   [facility]  [employer] [facility] Dept. 1) [address] [place] 2) [address] [place] 3)  [address], [place] [phone]  [phone]   [facility] Child Abuse Hotline ([phone] or [phone] (After Hours)
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
## 2013-11-10 NOTE — ED Notes (Signed)
Patient now at nurse's station requesting Dilantin, [name] made aware. No orders given, patient made aware.

## 2013-11-11 ENCOUNTER — Emergency Department (HOSPITAL_COMMUNITY): Payer: Medicare Other

## 2013-11-11 ENCOUNTER — Encounter (HOSPITAL_COMMUNITY): Payer: Self-pay | Admitting: *Deleted

## 2013-11-11 ENCOUNTER — Emergency Department (HOSPITAL_COMMUNITY)
Admission: EM | Admit: 2013-11-11 | Discharge: 2013-11-11 | Disposition: A | Discharge status: Home / Self Care | Payer: Medicare Other | Attending: Emergency Medicine | Admitting: Emergency Medicine

## 2013-11-11 DIAGNOSIS — Z792 Long term (current) use of antibiotics: Secondary | ICD-10-CM | POA: Diagnosis not present

## 2013-11-11 DIAGNOSIS — F319 Bipolar disorder, unspecified: Secondary | ICD-10-CM | POA: Insufficient documentation

## 2013-11-11 DIAGNOSIS — K2970 Gastritis, unspecified, without bleeding: Secondary | ICD-10-CM | POA: Diagnosis not present

## 2013-11-11 DIAGNOSIS — Z79899 Other long term (current) drug therapy: Secondary | ICD-10-CM | POA: Diagnosis not present

## 2013-11-11 DIAGNOSIS — S20211D Contusion of right front wall of thorax, subsequent encounter: Secondary | ICD-10-CM | POA: Diagnosis not present

## 2013-11-11 DIAGNOSIS — R109 Unspecified abdominal pain: Secondary | ICD-10-CM | POA: Insufficient documentation

## 2013-11-11 DIAGNOSIS — K920 Hematemesis: Secondary | ICD-10-CM | POA: Diagnosis not present

## 2013-11-11 DIAGNOSIS — Z720 Tobacco use: Secondary | ICD-10-CM | POA: Diagnosis not present

## 2013-11-11 DIAGNOSIS — G40909 Epilepsy, unspecified, not intractable, without status epilepticus: Secondary | ICD-10-CM | POA: Diagnosis not present

## 2013-11-11 DIAGNOSIS — R079 Chest pain, unspecified: Secondary | ICD-10-CM | POA: Diagnosis present

## 2013-11-11 DIAGNOSIS — S20212D Contusion of left front wall of thorax, subsequent encounter: Secondary | ICD-10-CM | POA: Insufficient documentation

## 2013-11-11 DIAGNOSIS — W5519XD Other contact with horse, subsequent encounter: Secondary | ICD-10-CM | POA: Diagnosis not present

## 2013-11-11 DIAGNOSIS — Z87828 Personal history of other (healed) physical injury and trauma: Secondary | ICD-10-CM | POA: Insufficient documentation

## 2013-11-11 DIAGNOSIS — M1990 Unspecified osteoarthritis, unspecified site: Secondary | ICD-10-CM | POA: Diagnosis not present

## 2013-11-11 DIAGNOSIS — R52 Pain, unspecified: Secondary | ICD-10-CM

## 2013-11-11 DIAGNOSIS — Z791 Long term (current) use of non-steroidal anti-inflammatories (NSAID): Secondary | ICD-10-CM | POA: Insufficient documentation

## 2013-11-11 MED ORDER — ONDANSETRON HCL 4 MG/2ML IJ SOLN
4.0000 mg | Freq: Once | INTRAMUSCULAR | Status: AC
  Administered 2013-11-11: 4 mg via INTRAVENOUS
  Filled 2013-11-11: qty 2

## 2013-11-11 MED ORDER — IOHEXOL 300 MG/ML  SOLN
50.0000 mL | Freq: Once | INTRAMUSCULAR | Status: AC | PRN
  Administered 2013-11-11: 50 mL via ORAL

## 2013-11-11 MED ORDER — IOHEXOL 300 MG/ML  SOLN
100.0000 mL | Freq: Once | INTRAMUSCULAR | Status: AC | PRN
Start: 2013-11-11 — End: 2013-11-11
  Administered 2013-11-11: 100 mL via INTRAVENOUS

## 2013-11-11 MED ORDER — ONDANSETRON HCL 4 MG/2ML IJ SOLN
4.0000 mg | Freq: Once | INTRAMUSCULAR | Status: AC
Start: 2013-11-11 — End: 2013-11-11
  Administered 2013-11-11: 4 mg via INTRAVENOUS
  Filled 2013-11-11: qty 2

## 2013-11-11 MED ORDER — ONDANSETRON 4 MG PO TBDP: ORAL_TABLET | ORAL | Status: DC

## 2013-11-11 MED ORDER — SODIUM CHLORIDE 0.9 % IJ SOLN
INTRAMUSCULAR | Status: AC
  Administered 2013-11-11: 10 mL
  Filled 2013-11-11: qty 60

## 2013-11-11 NOTE — Discharge Instructions (Signed)
Follow up with your md as needed °

## 2013-11-11 NOTE — ED Notes (Signed)
Up to bathroom without difficulty
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
## 2013-11-11 NOTE — ED Notes (Signed)
C/o chest pain with shortness of breath

## 2013-11-11 NOTE — ED Notes (Signed)
nad noted prior to dc. Dc instructions reviewed and explained. Voiced understanding. 1 rx given. Ambulated out without difficulty.

## 2013-11-12 ENCOUNTER — Emergency Department (HOSPITAL_COMMUNITY): Payer: Medicare Other

## 2013-11-12 ENCOUNTER — Encounter (HOSPITAL_COMMUNITY): Payer: Self-pay | Admitting: Emergency Medicine

## 2013-11-12 ENCOUNTER — Observation Stay (HOSPITAL_COMMUNITY): Payer: Medicare Other

## 2013-11-12 ENCOUNTER — Observation Stay (HOSPITAL_COMMUNITY)
Admission: EM | Admit: 2013-11-12 | Discharge: 2013-11-14 | Disposition: A | Discharge status: Home / Self Care | Payer: Medicare Other | Attending: Pulmonary Disease | Admitting: Pulmonary Disease

## 2013-11-12 DIAGNOSIS — Z79899 Other long term (current) drug therapy: Secondary | ICD-10-CM | POA: Diagnosis not present

## 2013-11-12 DIAGNOSIS — K047 Periapical abscess without sinus: Secondary | ICD-10-CM

## 2013-11-12 DIAGNOSIS — S0990XA Unspecified injury of head, initial encounter: Secondary | ICD-10-CM | POA: Diagnosis not present

## 2013-11-12 DIAGNOSIS — Z720 Tobacco use: Secondary | ICD-10-CM | POA: Diagnosis not present

## 2013-11-12 DIAGNOSIS — R569 Unspecified convulsions: Secondary | ICD-10-CM

## 2013-11-12 DIAGNOSIS — F319 Bipolar disorder, unspecified: Secondary | ICD-10-CM | POA: Insufficient documentation

## 2013-11-12 DIAGNOSIS — R748 Abnormal levels of other serum enzymes: Secondary | ICD-10-CM | POA: Insufficient documentation

## 2013-11-12 DIAGNOSIS — F4310 Post-traumatic stress disorder, unspecified: Secondary | ICD-10-CM | POA: Diagnosis not present

## 2013-11-12 DIAGNOSIS — E871 Hypo-osmolality and hyponatremia: Secondary | ICD-10-CM

## 2013-11-12 DIAGNOSIS — I4891 Unspecified atrial fibrillation: Secondary | ICD-10-CM | POA: Diagnosis not present

## 2013-11-12 DIAGNOSIS — Y929 Unspecified place or not applicable: Secondary | ICD-10-CM | POA: Insufficient documentation

## 2013-11-12 DIAGNOSIS — W228XXA Striking against or struck by other objects, initial encounter: Secondary | ICD-10-CM | POA: Diagnosis not present

## 2013-11-12 DIAGNOSIS — Y939 Activity, unspecified: Secondary | ICD-10-CM | POA: Diagnosis not present

## 2013-11-12 DIAGNOSIS — D696 Thrombocytopenia, unspecified: Secondary | ICD-10-CM

## 2013-11-12 DIAGNOSIS — M1990 Unspecified osteoarthritis, unspecified site: Secondary | ICD-10-CM | POA: Insufficient documentation

## 2013-11-12 DIAGNOSIS — K760 Fatty (change of) liver, not elsewhere classified: Secondary | ICD-10-CM | POA: Diagnosis not present

## 2013-11-12 DIAGNOSIS — K7689 Other specified diseases of liver: Secondary | ICD-10-CM | POA: Diagnosis not present

## 2013-11-12 DIAGNOSIS — R7989 Other specified abnormal findings of blood chemistry: Secondary | ICD-10-CM | POA: Diagnosis not present

## 2013-11-12 DIAGNOSIS — Z5181 Encounter for therapeutic drug level monitoring: Secondary | ICD-10-CM

## 2013-11-12 DIAGNOSIS — G40909 Epilepsy, unspecified, not intractable, without status epilepticus: Secondary | ICD-10-CM | POA: Diagnosis not present

## 2013-11-12 DIAGNOSIS — R945 Abnormal results of liver function studies: Secondary | ICD-10-CM | POA: Insufficient documentation

## 2013-11-12 DIAGNOSIS — S0083XA Contusion of other part of head, initial encounter: Secondary | ICD-10-CM | POA: Diagnosis not present

## 2013-11-12 DIAGNOSIS — Z792 Long term (current) use of antibiotics: Secondary | ICD-10-CM | POA: Insufficient documentation

## 2013-11-12 DIAGNOSIS — Z791 Long term (current) use of non-steroidal anti-inflammatories (NSAID): Secondary | ICD-10-CM | POA: Insufficient documentation

## 2013-11-12 DIAGNOSIS — E876 Hypokalemia: Secondary | ICD-10-CM

## 2013-11-12 DIAGNOSIS — M47812 Spondylosis without myelopathy or radiculopathy, cervical region: Secondary | ICD-10-CM | POA: Diagnosis not present

## 2013-11-12 DIAGNOSIS — F419 Anxiety disorder, unspecified: Secondary | ICD-10-CM | POA: Diagnosis present

## 2013-11-12 DIAGNOSIS — I517 Cardiomegaly: Secondary | ICD-10-CM | POA: Diagnosis not present

## 2013-11-12 DIAGNOSIS — Z23 Encounter for immunization: Secondary | ICD-10-CM | POA: Diagnosis not present

## 2013-11-12 DIAGNOSIS — R410 Disorientation, unspecified: Secondary | ICD-10-CM | POA: Diagnosis not present

## 2013-11-12 LAB — COMPREHENSIVE METABOLIC PANEL
ALT: 269 U/L — ABNORMAL HIGH (ref 0–53)
AST: 582 U/L — ABNORMAL HIGH (ref 0–37)
Albumin: 4 g/dL (ref 3.5–5.2)
Alkaline Phosphatase: 117 U/L (ref 39–117)
BUN: 6 mg/dL (ref 6–23)
CO2: 16 mEq/L — ABNORMAL LOW (ref 19–32)
Calcium: 8.6 mg/dL (ref 8.4–10.5)
Chloride: 89 mEq/L — ABNORMAL LOW (ref 96–112)
Creatinine, Ser: 0.74 mg/dL (ref 0.50–1.35)
GFR calc Af Amer: >90 mL/min (ref >90)
GFR calc non Af Amer: >90 mL/min (ref >90)
Glucose, Bld: 84 mg/dL (ref 70–99)
Potassium: 3.5 mEq/L — ABNORMAL LOW (ref 3.7–5.3)
Sodium: 135 mEq/L — ABNORMAL LOW (ref 137–147)
Total Bilirubin: 1.2 mg/dL (ref 0.3–1.2)
Total Protein: 7.6 g/dL (ref 6.0–8.3)

## 2013-11-12 LAB — RAPID URINE DRUG SCREEN, HOSP PERFORMED
Amphetamines: NOT DETECTED
Barbiturates: POSITIVE — AB
Benzodiazepines: NOT DETECTED
Cocaine: NOT DETECTED
Opiates: NOT DETECTED
Tetrahydrocannabinol: NOT DETECTED

## 2013-11-12 LAB — CBC WITH DIFFERENTIAL/PLATELET
Basophils Absolute: 0.1 10*3/uL (ref 0.0–0.1)
Basophils Relative: 1 % (ref 0–1)
Eosinophils Absolute: 0.1 10*3/uL (ref 0.0–0.7)
Eosinophils Relative: 2 % (ref 0–5)
HCT: 41.4 % (ref 39.0–52.0)
Hemoglobin: 14.4 g/dL (ref 13.0–17.0)
Lymphocytes Relative: 29 % (ref 12–46)
Lymphs Abs: 1.8 10*3/uL (ref 0.7–4.0)
MCH: 31.2 pg (ref 26.0–34.0)
MCHC: 34.8 g/dL (ref 30.0–36.0)
MCV: 89.8 fL (ref 78.0–100.0)
Monocytes Absolute: 0.3 10*3/uL (ref 0.1–1.0)
Monocytes Relative: 4 % (ref 3–12)
Neutro Abs: 3.8 10*3/uL (ref 1.7–7.7)
Neutrophils Relative %: 63 % (ref 43–77)
Platelets: 102 10*3/uL — ABNORMAL LOW (ref 150–400)
RBC: 4.61 MIL/uL (ref 4.22–5.81)
RDW: 12 % (ref 11.5–15.5)
WBC: 6 10*3/uL (ref 4.0–10.5)

## 2013-11-12 LAB — URINALYSIS, ROUTINE W REFLEX MICROSCOPIC
Glucose, UA: NEGATIVE mg/dL
Hgb urine dipstick: NEGATIVE
Ketones, ur: 15 mg/dL — AB
Leukocytes, UA: NEGATIVE
Nitrite: NEGATIVE
Protein, ur: NEGATIVE mg/dL
Specific Gravity, Urine: 1.01 (ref 1.005–1.030)
Urobilinogen, UA: 1 mg/dL (ref 0.0–1.0)
pH: 6.5 (ref 5.0–8.0)

## 2013-11-12 LAB — PROTIME-INR
INR: 0.97 (ref 0.00–1.49)
Prothrombin Time: 13 seconds (ref 11.6–15.2)

## 2013-11-12 LAB — TROPONIN I
Troponin I: <0.3 ng/mL (ref <0.30)
Troponin I: <0.3 ng/mL (ref <0.30)

## 2013-11-12 LAB — ETHANOL: Alcohol, Ethyl (B): 60 mg/dL — ABNORMAL HIGH (ref 0–11)

## 2013-11-12 LAB — MAGNESIUM: Magnesium: 2.1 mg/dL (ref 1.5–2.5)

## 2013-11-12 LAB — PHENYTOIN LEVEL, TOTAL: Phenytoin Lvl: <2.5 ug/mL — ABNORMAL LOW (ref 10.0–20.0)

## 2013-11-12 LAB — MRSA PCR SCREENING: MRSA by PCR: NEGATIVE

## 2013-11-12 LAB — TSH: TSH: 2.36 u[IU]/mL (ref 0.350–4.500)

## 2013-11-12 LAB — FERRITIN: Ferritin: 2118 ng/mL — ABNORMAL HIGH (ref 22–322)

## 2013-11-12 MED ORDER — ONDANSETRON HCL 4 MG/2ML IJ SOLN
4.0000 mg | Freq: Once | INTRAMUSCULAR | Status: AC
  Administered 2013-11-12: 4 mg via INTRAVENOUS

## 2013-11-12 MED ORDER — BUPRENORPHINE HCL-NALOXONE HCL 8-2 MG SL SUBL
1.0000 | SUBLINGUAL_TABLET | Freq: Every day | SUBLINGUAL | Status: DC
  Administered 2013-11-12 – 2013-11-14 (×3): 1 via SUBLINGUAL
  Filled 2013-11-12 (×3): qty 1

## 2013-11-12 MED ORDER — ONDANSETRON HCL 4 MG/2ML IJ SOLN: 4.0000 mg | Freq: Four times a day (QID) | INTRAMUSCULAR | Status: DC | PRN

## 2013-11-12 MED ORDER — ONDANSETRON HCL 4 MG/2ML IJ SOLN
4.0000 mg | Freq: Once | INTRAMUSCULAR | Status: DC
Start: 2013-11-12 — End: 2013-11-12

## 2013-11-12 MED ORDER — DILTIAZEM HCL 25 MG/5ML IV SOLN
25.0000 mg | Freq: Once | INTRAVENOUS | Status: AC
  Administered 2013-11-12: 25 mg via INTRAVENOUS
  Filled 2013-11-12: qty 5

## 2013-11-12 MED ORDER — DULOXETINE HCL 30 MG PO CPEP
30.0000 mg | ORAL_CAPSULE | Freq: Every day | ORAL | Status: DC
Start: 2013-11-12 — End: 2013-11-14
  Administered 2013-11-12 – 2013-11-14 (×3): 30 mg via ORAL
  Filled 2013-11-12 (×3): qty 1

## 2013-11-12 MED ORDER — CLONAZEPAM 0.5 MG PO TABS
0.5000 mg | ORAL_TABLET | Freq: Three times a day (TID) | ORAL | Status: DC | PRN
  Administered 2013-11-13 – 2013-11-14 (×4): 0.5 mg via ORAL
  Filled 2013-11-12 (×4): qty 1

## 2013-11-12 MED ORDER — BUPRENORPHINE HCL-NALOXONE HCL 8-2 MG SL FILM
1.0000 | ORAL_FILM | Freq: Every day | SUBLINGUAL | Status: DC
  Filled 2013-11-12 (×2): qty 1

## 2013-11-12 MED ORDER — FLECAINIDE ACETATE 100 MG PO TABS
300.0000 mg | ORAL_TABLET | Freq: Once | ORAL | Status: AC
  Administered 2013-11-12: 300 mg via ORAL
  Filled 2013-11-12: qty 3

## 2013-11-12 MED ORDER — ONDANSETRON HCL 4 MG/2ML IJ SOLN
INTRAMUSCULAR | Status: AC
  Filled 2013-11-12: qty 2

## 2013-11-12 MED ORDER — SODIUM CHLORIDE 0.9 % IV SOLN
INTRAVENOUS | Status: DC
  Administered 2013-11-12: 15:00:00 via INTRAVENOUS

## 2013-11-12 MED ORDER — PHENYTOIN SODIUM 50 MG/ML IJ SOLN
1000.0000 mg | Freq: Once | INTRAMUSCULAR | Status: AC
  Administered 2013-11-12: 1000 mg via INTRAVENOUS
  Filled 2013-11-12: qty 20

## 2013-11-12 MED ORDER — DILTIAZEM HCL 25 MG/5ML IV SOLN
20.0000 mg | Freq: Once | INTRAVENOUS | Status: AC
  Administered 2013-11-12: 20 mg via INTRAVENOUS
  Filled 2013-11-12: qty 5

## 2013-11-12 MED ORDER — ONDANSETRON HCL 4 MG PO TABS: 4.0000 mg | ORAL_TABLET | Freq: Four times a day (QID) | ORAL | Status: DC | PRN

## 2013-11-12 MED ORDER — DILTIAZEM HCL 100 MG IV SOLR
5.0000 mg/h | Freq: Once | INTRAVENOUS | Status: AC
  Administered 2013-11-12: 5 mg/h via INTRAVENOUS
  Filled 2013-11-12: qty 100

## 2013-11-12 MED ORDER — LEVETIRACETAM IN NACL 1000 MG/100ML IV SOLN
1000.0000 mg | Freq: Two times a day (BID) | INTRAVENOUS | Status: DC
  Filled 2013-11-12: qty 100

## 2013-11-12 MED ORDER — NICOTINE 21 MG/24HR TD PT24
21.0000 mg | MEDICATED_PATCH | Freq: Every day | TRANSDERMAL | Status: DC
  Administered 2013-11-12 – 2013-11-14 (×3): 21 mg via TRANSDERMAL
  Filled 2013-11-12 (×3): qty 1

## 2013-11-12 MED ORDER — CLONAZEPAM 0.5 MG PO TABS
1.0000 mg | ORAL_TABLET | Freq: Three times a day (TID) | ORAL | Status: DC | PRN
  Administered 2013-11-12: 1 mg via ORAL
  Filled 2013-11-12: qty 2

## 2013-11-12 MED ORDER — LORAZEPAM 2 MG/ML IJ SOLN
1.0000 mg | Freq: Once | INTRAMUSCULAR | Status: AC
  Administered 2013-11-12: 1 mg via INTRAVENOUS
  Filled 2013-11-12: qty 1

## 2013-11-12 MED ORDER — BUPRENORPHINE HCL-NALOXONE HCL 4-1 MG SL FILM: 2.0000 | ORAL_FILM | Freq: Every day | SUBLINGUAL | Status: DC

## 2013-11-12 MED ORDER — GABAPENTIN 400 MG PO CAPS
400.0000 mg | ORAL_CAPSULE | Freq: Three times a day (TID) | ORAL | Status: DC
  Administered 2013-11-12 – 2013-11-14 (×7): 400 mg via ORAL
  Filled 2013-11-12 (×7): qty 1

## 2013-11-12 MED ORDER — PNEUMOCOCCAL VAC POLYVALENT 25 MCG/0.5ML IJ INJ
0.5000 mL | INJECTION | INTRAMUSCULAR | Status: AC
  Administered 2013-11-13: 0.5 mL via INTRAMUSCULAR
  Filled 2013-11-12: qty 0.5

## 2013-11-12 MED ORDER — INFLUENZA VAC SPLIT QUAD 0.5 ML IM SUSY
0.5000 mL | PREFILLED_SYRINGE | INTRAMUSCULAR | Status: AC
  Administered 2013-11-13: 0.5 mL via INTRAMUSCULAR
  Filled 2013-11-12: qty 0.5

## 2013-11-12 MED ORDER — MELOXICAM 7.5 MG PO TABS
7.5000 mg | ORAL_TABLET | Freq: Two times a day (BID) | ORAL | Status: DC
  Administered 2013-11-12 (×2): 7.5 mg via ORAL
  Filled 2013-11-12 (×3): qty 1

## 2013-11-12 MED ORDER — ALUM & MAG HYDROXIDE-SIMETH 200-200-20 MG/5ML PO SUSP: 30.0000 mL | Freq: Four times a day (QID) | ORAL | Status: DC | PRN

## 2013-11-12 MED ORDER — LORAZEPAM 1 MG PO TABS
1.0000 mg | ORAL_TABLET | Freq: Once | ORAL | Status: AC
  Administered 2013-11-12: 1 mg via ORAL
  Filled 2013-11-12: qty 1

## 2013-11-12 MED ORDER — GI COCKTAIL ~~LOC~~
30.0000 mL | Freq: Once | ORAL | Status: AC
  Administered 2013-11-12: 30 mL via ORAL
  Filled 2013-11-12: qty 30

## 2013-11-12 MED ORDER — DILTIAZEM HCL 100 MG IV SOLR
5.0000 mg/h | INTRAVENOUS | Status: DC
  Filled 2013-11-12: qty 100

## 2013-11-12 MED ORDER — AMOXICILLIN 250 MG PO CAPS
500.0000 mg | ORAL_CAPSULE | Freq: Three times a day (TID) | ORAL | Status: DC
  Administered 2013-11-12 – 2013-11-14 (×6): 500 mg via ORAL
  Filled 2013-11-12 (×7): qty 2
  Filled 2013-11-12 (×3): qty 1
  Filled 2013-11-12 (×2): qty 2
  Filled 2013-11-12: qty 1
  Filled 2013-11-12: qty 2

## 2013-11-12 MED ORDER — ACETAMINOPHEN 325 MG PO TABS: 650.0000 mg | ORAL_TABLET | Freq: Four times a day (QID) | ORAL | Status: DC | PRN

## 2013-11-12 MED ORDER — PHENYTOIN SODIUM EXTENDED 100 MG PO CAPS: 300.0000 mg | ORAL_CAPSULE | Freq: Every day | ORAL | Status: DC

## 2013-11-12 MED ORDER — ACETAMINOPHEN 650 MG RE SUPP: 650.0000 mg | Freq: Four times a day (QID) | RECTAL | Status: DC | PRN

## 2013-11-12 MED ORDER — LEVETIRACETAM 500 MG PO TABS
500.0000 mg | ORAL_TABLET | Freq: Two times a day (BID) | ORAL | Status: DC
  Administered 2013-11-12 – 2013-11-14 (×5): 500 mg via ORAL
  Filled 2013-11-12 (×5): qty 1

## 2013-11-12 MED ORDER — CLONAZEPAM 0.5 MG PO TABS
0.5000 mg | ORAL_TABLET | Freq: Two times a day (BID) | ORAL | Status: DC
  Administered 2013-11-12: 0.5 mg via ORAL
  Filled 2013-11-12: qty 1
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
## 2013-11-12 NOTE — ED Notes (Signed)
Dr Preston FleetingGlick wants the pt to have the dilantin and also wants the HR below 110 before going to CT.

## 2013-11-12 NOTE — Progress Notes (Signed)
  Echocardiogram 2D Echocardiogram has been performed.  Dean [name] 11/12/2013, 3:15 PM
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
## 2013-11-12 NOTE — Progress Notes (Signed)
IV access infiltrated. Noted that Pt has had 5 IV's in the last 24 hours and has poor venous access with last IV being obtained after multiple attempts by multiple nurses. Midlevel care provider notified and IV left out at this time. Pt tolerating PO without difficulty and is on PO medications.

## 2013-11-12 NOTE — ED Notes (Signed)
Pt c/o seeing spots and shaking x 3 hours.

## 2013-11-13 DIAGNOSIS — G40909 Epilepsy, unspecified, not intractable, without status epilepticus: Secondary | ICD-10-CM | POA: Diagnosis not present

## 2013-11-13 DIAGNOSIS — R569 Unspecified convulsions: Secondary | ICD-10-CM | POA: Diagnosis not present

## 2013-11-13 DIAGNOSIS — K7010 Alcoholic hepatitis without ascites: Secondary | ICD-10-CM | POA: Diagnosis not present

## 2013-11-13 LAB — CBC
HCT: 41.4 % (ref 39.0–52.0)
Hemoglobin: 14.2 g/dL (ref 13.0–17.0)
MCH: 31 pg (ref 26.0–34.0)
MCHC: 34.3 g/dL (ref 30.0–36.0)
MCV: 90.4 fL (ref 78.0–100.0)
Platelets: 67 10*3/uL — ABNORMAL LOW (ref 150–400)
RBC: 4.58 MIL/uL (ref 4.22–5.81)
RDW: 12.2 % (ref 11.5–15.5)
WBC: 3.6 10*3/uL — ABNORMAL LOW (ref 4.0–10.5)

## 2013-11-13 LAB — COMPREHENSIVE METABOLIC PANEL
ALT: 258 U/L — ABNORMAL HIGH (ref 0–53)
AST: 653 U/L — ABNORMAL HIGH (ref 0–37)
Albumin: 3.4 g/dL — ABNORMAL LOW (ref 3.5–5.2)
Alkaline Phosphatase: 120 U/L — ABNORMAL HIGH (ref 39–117)
Anion gap: 14 (ref 5–15)
BUN: 6 mg/dL (ref 6–23)
CO2: 26 mEq/L (ref 19–32)
Calcium: 8.9 mg/dL (ref 8.4–10.5)
Chloride: 96 mEq/L (ref 96–112)
Creatinine, Ser: 0.7 mg/dL (ref 0.50–1.35)
GFR calc Af Amer: >90 mL/min (ref >90)
GFR calc non Af Amer: >90 mL/min (ref >90)
Glucose, Bld: 116 mg/dL — ABNORMAL HIGH (ref 70–99)
Potassium: 3.4 mEq/L — ABNORMAL LOW (ref 3.7–5.3)
Sodium: 136 mEq/L — ABNORMAL LOW (ref 137–147)
Total Bilirubin: 1.4 mg/dL — ABNORMAL HIGH (ref 0.3–1.2)
Total Protein: 6.8 g/dL (ref 6.0–8.3)

## 2013-11-13 LAB — HEPATITIS PANEL, ACUTE
HCV Ab: NEGATIVE
Hep A IgM: NONREACTIVE
Hep B C IgM: NONREACTIVE
Hepatitis B Surface Ag: NEGATIVE

## 2013-11-13 LAB — TROPONIN I: Troponin I: <0.3 ng/mL (ref <0.30)

## 2013-11-13 LAB — PHENYTOIN LEVEL, TOTAL: Phenytoin Lvl: 9.1 ug/mL — ABNORMAL LOW (ref 10.0–20.0)

## 2013-11-13 MED ORDER — LORAZEPAM 1 MG PO TABS
1.0000 mg | ORAL_TABLET | Freq: Four times a day (QID) | ORAL | Status: DC | PRN
  Administered 2013-11-13 – 2013-11-14 (×3): 1 mg via ORAL
  Filled 2013-11-13 (×3): qty 1

## 2013-11-13 MED ORDER — LORAZEPAM 2 MG/ML IJ SOLN: 1.0000 mg | Freq: Four times a day (QID) | INTRAMUSCULAR | Status: DC | PRN

## 2013-11-13 MED ORDER — VITAMIN B-1 100 MG PO TABS
100.0000 mg | ORAL_TABLET | Freq: Every day | ORAL | Status: DC
  Administered 2013-11-13 – 2013-11-14 (×2): 100 mg via ORAL
  Filled 2013-11-13 (×2): qty 1

## 2013-11-13 MED ORDER — FOLIC ACID 1 MG PO TABS
1.0000 mg | ORAL_TABLET | Freq: Every day | ORAL | Status: DC
  Administered 2013-11-13 – 2013-11-14 (×2): 1 mg via ORAL
  Filled 2013-11-13 (×2): qty 1

## 2013-11-13 MED ORDER — METOPROLOL SUCCINATE ER 50 MG PO TB24
100.0000 mg | ORAL_TABLET | Freq: Every day | ORAL | Status: DC
  Administered 2013-11-13 – 2013-11-14 (×2): 100 mg via ORAL
  Filled 2013-11-13 (×3): qty 2

## 2013-11-13 MED ORDER — THIAMINE HCL 100 MG/ML IJ SOLN: 100.0000 mg | Freq: Every day | INTRAMUSCULAR | Status: DC

## 2013-11-13 MED ORDER — ADULT MULTIVITAMIN W/MINERALS CH
1.0000 | ORAL_TABLET | Freq: Every day | ORAL | Status: DC
  Administered 2013-11-13 – 2013-11-14 (×2): 1 via ORAL
  Filled 2013-11-13 (×2): qty 1

## 2013-11-13 NOTE — ED Notes (Signed)
Late entry: Patient fell in floor in room #6, hitting his head over the door. Loud noise was heard when he fell, and found patient in the floor having a seizure. Blood noted to forehead. Patient placed on backboard, protecting c-spine, and moved back to stretcher. Placed on cardiac monitor, Iv access obtained by Ron, RN using ultrasound. Patient no longer seizing and speaking to us @ 0540. Patient did not recall what happened. Patient in A-fib at this time. MD at bedside. Orders for seizure medication, A-fib, and x-rays provided at this time.

## 2013-11-13 NOTE — Care Management Note (Addendum)
    Page 1 of 1   11/14/2013     9:31:05 AM CARE MANAGEMENT NOTE 11/14/2013  Patient:  Michelene HeadyCHILTON,Orvill T   Account Number:  [id number]  Date Initiated:  11/13/2013  Documentation initiated by:  Sharrie RothmanBLACKWELL,Ellie Bryand C  Subjective/Objective Assessment:   Pt admitted from home with seizures and a fib. Pt lives alone and will return home at discharge. Pt is independent with ADl's.     Action/Plan:   No Cm needs noted.   Anticipated DC Date:  11/14/2013   Anticipated DC Plan:  HOME/SELF CARE      DC Planning Services  CM consult      Choice offered to / List presented to:             Status of service:  Completed, signed off Medicare Important Message given?   (If response is "NO", the following Medicare IM given date fields will be blank) Date Medicare IM given:   Medicare IM given by:   Date Additional Medicare IM given:   Additional Medicare IM given by:    Discharge Disposition:  HOME/SELF CARE  Per UR Regulation:    If discussed at Long Length of Stay Meetings, dates discussed:    Comments:  11/14/13 0930 Arlyss Queenammy Wayde Gopaul, RN BSN CM Pt to be discharged home today. No CM needs noted.  11/13/13 1300 Arlyss Queenammy Mindy Behnken, RN BSN CM

## 2013-11-13 NOTE — Progress Notes (Signed)
Subjective: He was admitted with acute alcohol intoxication, seizure, had cardiac arrhythmia after the seizure and has what appears to be acute alcoholic hepatitis. He has a long history of drug and alcohol abuse and when he came to the emergency department his alcohol level was 60. He shows no signs of withdrawal at this time.  Objective: Vital signs in last 24 hours: Temp:  [98.2 F (36.8 C)-99.2 F (37.3 C)] 98.2 F (36.8 C) (11/04 0400) Pulse Rate:  [83-131] 83 (11/03 1524) Resp:  [22-30] 22 (11/03 1524) BP: (120-148)/(94-118) 148/118 mmHg (11/03 1524) SpO2:  [94 %-98 %] 96 % (11/03 1524) Weight:  [91.8 kg (202 lb 6.1 oz)] 91.8 kg (202 lb 6.1 oz) (11/04 0500) Weight change: 1.081 kg (2 lb 6.1 oz) Last BM Date: 11/11/13  Intake/Output from previous day: 11/03 0701 - 11/04 0700 In: 81.3 [I.V.:81.3] Out: -   PHYSICAL EXAM General appearance: alert and mild distress Resp: clear to auscultation bilaterally Cardio: regular rate and rhythm, S1, S2 normal, no murmur, click, rub or gallop GI: soft, non-tender; bowel sounds normal; no masses,  no organomegaly Extremities: extremities normal, atraumatic, no cyanosis or edema  Lab Results:  Results for orders placed or performed during the hospital encounter of 11/12/13 (from the past 48 hour(s))  CBC with Differential     Status: Abnormal   Collection Time: 11/12/13  6:02 AM  Result Value Ref Range   WBC 6.0 4.0 - 10.5 K/uL   RBC 4.61 4.22 - 5.81 MIL/uL   Hemoglobin 14.4 13.0 - 17.0 g/dL   HCT 41.4 39.0 - 52.0 %   MCV 89.8 78.0 - 100.0 fL   MCH 31.2 26.0 - 34.0 pg   MCHC 34.8 30.0 - 36.0 g/dL   RDW 12.0 11.5 - 15.5 %   Platelets 102 (L) 150 - 400 K/uL    Comment: SPECIMEN CHECKED FOR CLOTS PLATELET COUNT CONFIRMED BY SMEAR    Neutrophils Relative % 63 43 - 77 %   Neutro Abs 3.8 1.7 - 7.7 K/uL   Lymphocytes Relative 29 12 - 46 %   Lymphs Abs 1.8 0.7 - 4.0 K/uL   Monocytes Relative 4 3 - 12 %   Monocytes Absolute 0.3 0.1 -  1.0 K/uL   Eosinophils Relative 2 0 - 5 %   Eosinophils Absolute 0.1 0.0 - 0.7 K/uL   Basophils Relative 1 0 - 1 %   Basophils Absolute 0.1 0.0 - 0.1 K/uL  Comprehensive metabolic panel     Status: Abnormal   Collection Time: 11/12/13  6:02 AM  Result Value Ref Range   Sodium 135 (L) 137 - 147 mEq/L   Potassium 3.5 (L) 3.7 - 5.3 mEq/L   Chloride 89 (L) 96 - 112 mEq/L   CO2 16 (L) 19 - 32 mEq/L   Glucose, Bld 84 70 - 99 mg/dL   BUN 6 6 - 23 mg/dL   Creatinine, Ser 0.74 0.50 - 1.35 mg/dL   Calcium 8.6 8.4 - 10.5 mg/dL   Total Protein 7.6 6.0 - 8.3 g/dL   Albumin 4.0 3.5 - 5.2 g/dL   AST 582 (H) 0 - 37 U/L   ALT 269 (H) 0 - 53 U/L   Alkaline Phosphatase 117 39 - 117 U/L   Total Bilirubin 1.2 0.3 - 1.2 mg/dL   GFR calc non Af Amer >90 >90 mL/min   GFR calc Af Amer >90 >90 mL/min    Comment: (NOTE) The eGFR has been calculated using the CKD EPI  equation. This calculation has not been validated in all clinical situations. eGFR's persistently <90 mL/min signify possible Chronic Kidney Disease.   Ethanol     Status: Abnormal   Collection Time: 11/12/13  6:02 AM  Result Value Ref Range   Alcohol, Ethyl (B) 60 (H) 0 - 11 mg/dL    Comment:        LOWEST DETECTABLE LIMIT FOR SERUM ALCOHOL IS 11 mg/dL FOR MEDICAL PURPOSES ONLY   Phenytoin level, total     Status: Abnormal   Collection Time: 11/12/13  6:02 AM  Result Value Ref Range   Phenytoin Lvl <2.5 (L) 10.0 - 20.0 ug/mL  MRSA PCR Screening     Status: None   Collection Time: 11/12/13  9:12 AM  Result Value Ref Range   MRSA by PCR NEGATIVE NEGATIVE    Comment:        The GeneXpert MRSA Assay (FDA approved for NASAL specimens only), is one component of a comprehensive MRSA colonization surveillance program. It is not intended to diagnose MRSA infection nor to guide or monitor treatment for MRSA infections.   Magnesium     Status: None   Collection Time: 11/12/13  9:16 AM  Result Value Ref Range   Magnesium 2.1 1.5 -  2.5 mg/dL  TSH     Status: None   Collection Time: 11/12/13  9:16 AM  Result Value Ref Range   TSH 2.360 0.350 - 4.500 uIU/mL    Comment: Performed at [facility]  Ferritin     Status: Abnormal   Collection Time: 11/12/13  3:44 PM  Result Value Ref Range   Ferritin 2118 (H) 22 - 322 ng/mL    Comment: (NOTE) Result repeated and verified. Result confirmed by automatic dilution. Performed at [facility]   Hepatitis panel, acute     Status: None   Collection Time: 11/12/13  3:44 PM  Result Value Ref Range   Hepatitis B Surface Ag NEGATIVE NEGATIVE   HCV Ab NEGATIVE NEGATIVE   Hep A IgM NON REACTIVE NON REACTIVE    Comment: (NOTE) Effective November 25, 2013, Hepatitis Acute Panel (test code [phone]) will be revised to automatically reflex to the Hepatitis C Viral RNA, Quantitative, Real-Time PCR assay if the Hepatitis C antibody screening result is Reactive. This action is being taken to ensure that the CDC/USPSTF recommended HCV diagnostic algorithm with the appropriate test reflex needed for accurate interpretation is followed.    Hep B C IgM NON REACTIVE NON REACTIVE    Comment: (NOTE) High levels of Hepatitis B Core IgM antibody are detectable during the acute stage of Hepatitis B. This antibody is used to differentiate current from past HBV infection. Performed at [facility]     Status: None   Collection Time: 11/12/13  3:44 PM  Result Value Ref Range   Prothrombin Time 13.0 11.6 - 15.2 seconds   INR 0.97 0.00 - 1.49  Troponin I (q 6hr x 3)     Status: None   Collection Time: 11/12/13  3:44 PM  Result Value Ref Range   Troponin I <0.30 <0.30 ng/mL    Comment:        Due to the release kinetics of cTnI, a negative result within the first hours of the onset of symptoms does not rule out myocardial infarction with certainty. If myocardial infarction is still suspected, repeat the test at appropriate intervals.   Urine rapid  drug screen (hosp performed)  Status: Abnormal   Collection Time: 11/12/13  6:35 PM  Result Value Ref Range   Opiates NONE DETECTED NONE DETECTED   Cocaine NONE DETECTED NONE DETECTED   Benzodiazepines NONE DETECTED NONE DETECTED   Amphetamines NONE DETECTED NONE DETECTED   Tetrahydrocannabinol NONE DETECTED NONE DETECTED   Barbiturates POSITIVE (A) NONE DETECTED    Comment:        DRUG SCREEN FOR MEDICAL PURPOSES ONLY.  IF CONFIRMATION IS NEEDED FOR ANY PURPOSE, NOTIFY LAB WITHIN 5 DAYS.        LOWEST DETECTABLE LIMITS FOR URINE DRUG SCREEN Drug Class       Cutoff (ng/mL) Amphetamine      1000 Barbiturate      200 Benzodiazepine   196 Tricyclics       222 Opiates          300 Cocaine          300 THC              50   Urinalysis, Routine w reflex microscopic     Status: Abnormal   Collection Time: 11/12/13  6:36 PM  Result Value Ref Range   Color, Urine YELLOW YELLOW   APPearance CLEAR CLEAR   Specific Gravity, Urine 1.010 1.005 - 1.030   pH 6.5 5.0 - 8.0   Glucose, UA NEGATIVE NEGATIVE mg/dL   Hgb urine dipstick NEGATIVE NEGATIVE   Bilirubin Urine MODERATE (A) NEGATIVE   Ketones, ur 15 (A) NEGATIVE mg/dL   Protein, ur NEGATIVE NEGATIVE mg/dL   Urobilinogen, UA 1.0 0.0 - 1.0 mg/dL   Nitrite NEGATIVE NEGATIVE   Leukocytes, UA NEGATIVE NEGATIVE    Comment: MICROSCOPIC NOT DONE ON URINES WITH NEGATIVE PROTEIN, BLOOD, LEUKOCYTES, NITRITE, OR GLUCOSE <1000 mg/dL.  Troponin I (q 6hr x 3)     Status: None   Collection Time: 11/12/13  8:59 PM  Result Value Ref Range   Troponin I <0.30 <0.30 ng/mL    Comment:        Due to the release kinetics of cTnI, a negative result within the first hours of the onset of symptoms does not rule out myocardial infarction with certainty. If myocardial infarction is still suspected, repeat the test at appropriate intervals.   Troponin I (q 6hr x 3)     Status: None   Collection Time: 11/13/13  3:06 AM  Result Value Ref Range    Troponin I <0.30 <0.30 ng/mL    Comment:        Due to the release kinetics of cTnI, a negative result within the first hours of the onset of symptoms does not rule out myocardial infarction with certainty. If myocardial infarction is still suspected, repeat the test at appropriate intervals.   Comprehensive metabolic panel     Status: Abnormal   Collection Time: 11/13/13  3:06 AM  Result Value Ref Range   Sodium 136 (L) 137 - 147 mEq/L   Potassium 3.4 (L) 3.7 - 5.3 mEq/L   Chloride 96 96 - 112 mEq/L   CO2 26 19 - 32 mEq/L   Glucose, Bld 116 (H) 70 - 99 mg/dL   BUN 6 6 - 23 mg/dL   Creatinine, Ser 0.70 0.50 - 1.35 mg/dL   Calcium 8.9 8.4 - 10.5 mg/dL   Total Protein 6.8 6.0 - 8.3 g/dL   Albumin 3.4 (L) 3.5 - 5.2 g/dL   AST 653 (H) 0 - 37 U/L   ALT 258 (H) 0 - 53 U/L  Alkaline Phosphatase 120 (H) 39 - 117 U/L   Total Bilirubin 1.4 (H) 0.3 - 1.2 mg/dL   GFR calc non Af Amer >90 >90 mL/min   GFR calc Af Amer >90 >90 mL/min    Comment: (NOTE) The eGFR has been calculated using the CKD EPI equation. This calculation has not been validated in all clinical situations. eGFR's persistently <90 mL/min signify possible Chronic Kidney Disease.    Anion gap 14 5 - 15  CBC     Status: Abnormal   Collection Time: 11/13/13  3:06 AM  Result Value Ref Range   WBC 3.6 (L) 4.0 - 10.5 K/uL   RBC 4.58 4.22 - 5.81 MIL/uL   Hemoglobin 14.2 13.0 - 17.0 g/dL   HCT 41.4 39.0 - 52.0 %   MCV 90.4 78.0 - 100.0 fL   MCH 31.0 26.0 - 34.0 pg   MCHC 34.3 30.0 - 36.0 g/dL   RDW 12.2 11.5 - 15.5 %   Platelets 67 (L) 150 - 400 K/uL    Comment: RESULT REPEATED AND VERIFIED DELTA CHECK NOTED SPECIMEN CHECKED FOR CLOTS PLATELETS APPEAR DECREASED SMEAR STAINED AND AVAILABLE FOR REVIEW   Phenytoin level, total     Status: Abnormal   Collection Time: 11/13/13  3:06 AM  Result Value Ref Range   Phenytoin Lvl 9.1 (L) 10.0 - 20.0 ug/mL    ABGS No results for input(s): PHART, PO2ART, TCO2, HCO3 in the  last 72 hours.  Invalid input(s): PCO2 CULTURES Recent Results (from the past 240 hour(s))  MRSA PCR Screening     Status: None   Collection Time: 11/12/13  9:12 AM  Result Value Ref Range Status   MRSA by PCR NEGATIVE NEGATIVE Final    Comment:        The GeneXpert MRSA Assay (FDA approved for NASAL specimens only), is one component of a comprehensive MRSA colonization surveillance program. It is not intended to diagnose MRSA infection nor to guide or monitor treatment for MRSA infections.    Studies/Results: Ct Head Wo Contrast  11/12/2013   CLINICAL DATA:  Status post fall from a standing position striking the right-sided the patient and hand without loss of consciousness; bruising over the right face and night today; seizure like activity observed in the emergency department  EXAM: CT HEAD WITHOUT CONTRAST  CT MAXILLOFACIAL WITHOUT CONTRAST  CT CERVICAL SPINE WITHOUT CONTRAST  TECHNIQUE: Multidetector CT imaging of the head, cervical spine, and maxillofacial structures were performed using the standard protocol without intravenous contrast. Multiplanar CT image reconstructions of the cervical spine and maxillofacial structures were also generated.  COMPARISON:  CT scan of the brain dated May 12, 2012.  FINDINGS: CT HEAD FINDINGS  The ventricles are normal in size and position. There is no intracranial hemorrhage nor intracranial mass effect. There is no acute ischemic change. The cerebellum and brainstem are unremarkable.  The observed paranasal sinuses and mastoid air cells are clear. There is no acute skull fracture nor cephalohematoma.  CT MAXILLOFACIAL FINDINGS  There is mild soft tissues swelling over the right lateral face. There is mild preseptal edema on the right. The globes are intact. There are no air-fluid levels within the paranasal sinuses. There is a retention cyst or polyp in the inferior aspect of the right maxillary sinus.  There are deformities of the nasal bone without  significant soft tissue swelling. The nasal septum is mildly deviated towards the right without evidence of nasal passage obstruction. The maxilla and mandible are intact. The  pterygoid plates and zygomatic arches are intact. The bony orbits are intact. The paranasal sinus walls are intact.  CT CERVICAL SPINE FINDINGS  There is mild reversal of the normal cervical lordosis. The vertebral bodies are preserved in height. There is no perched facet nor facet or spinous process fracture. There is mild degenerative disc space narrowing at C5-6. The prevertebral soft tissue spaces are unremarkable where visualized. The odontoid is intact. A subtle cleft at the right base of the odontoid is demonstrated but was previously seen on a study of June 30, 2011. The visualized soft tissues of the neck reveal no acute abnormalities.  IMPRESSION: 1. There is no acute intracranial abnormality nor evidence of an acute skull fracture. 2. There is no acute facial bone fracture. There is a small hematoma over the lateral right frontal region with a small amount of adjacent preseptal edema. No abnormality of the globes or intraconal or extraconal soft tissues are demonstrated. No acute paranasal sinus abnormalities are demonstrated. There are old nasal bone fractures. 3. There is no acute cervical spine fracture nor dislocation. Mild loss of the normal cervical lordosis likely reflects muscle spasm. There is mild degenerative disc change at C5-6.   Electronically Signed   By: David  Martinique   On: 11/12/2013 09:37   Ct Chest W Contrast  11/11/2013   CLINICAL DATA:  Right chest pain in the clavicle area after being kicked by horse 8 days ago. Diffuse abdominal pain for the past 2 days. Hematemesis for the past 2 days. Only able to tolerate drinking 1/2 bottle of contrast.  EXAM: CT CHEST, ABDOMEN, AND PELVIS WITH CONTRAST  TECHNIQUE: Multidetector CT imaging of the chest, abdomen and pelvis was performed following the standard protocol  during bolus administration of intravenous contrast.  CONTRAST:  34m OMNIPAQUE IOHEXOL 300 MG/ML SOLN, 1096mOMNIPAQUE IOHEXOL 300 MG/ML SOLN  COMPARISON:  Chest radiographs obtained yesterday. Chest CT dated 05/02/2013. Abdomen ultrasound dated 03/18/2010.  FINDINGS: CT CHEST FINDINGS  The lungs are clear. No fracture or pneumothorax. No pleural or mediastinal fluid. T11 limbus vertebra.  CT ABDOMEN AND PELVIS FINDINGS  Marked diffuse low density of the liver relative to the spleen. Normal appearing spleen, pancreas, gallbladder, adrenal glands, kidneys and urinary bladder. 1.1 cm rounded area of fluid density in the posterior aspect of a normal-sized prostate gland. Normal appearing seminal vesicles. No gastrointestinal abnormalities or enlarged lymph nodes. Normal appearing appendix. No lumbar spine fracture or subluxation. Small Schmorl's node in the superior aspect of the L2 vertebral body.  IMPRESSION: 1. No acute injury to the chest, abdomen or pelvis. 2. Marked diffuse hepatic steatosis. 3. 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   Electronically Signed   By: StEnrique Sack.D.   On: 11/11/2013 19:36   Ct Cervical Spine Wo Contrast  11/12/2013   CLINICAL DATA:  Status post fall from a standing position striking the right-sided the patient and hand without loss of consciousness; bruising over the right face and night today; seizure like activity observed in the emergency department  EXAM: CT HEAD WITHOUT CONTRAST  CT MAXILLOFACIAL WITHOUT CONTRAST  CT CERVICAL SPINE WITHOUT CONTRAST  TECHNIQUE: Multidetector CT imaging of the head, cervical spine, and maxillofacial structures were performed using the standard protocol without intravenous contrast. Multiplanar CT image reconstructions of the cervical spine and maxillofacial structures were also generated.  COMPARISON:  CT scan of the brain dated May 12, 2012.  FINDINGS: CT HEAD FINDINGS  The ventricles are  normal in size and position. There is no intracranial hemorrhage nor intracranial mass effect. There is no acute ischemic change. The cerebellum and brainstem are unremarkable.  The observed paranasal sinuses and mastoid air cells are clear. There is no acute skull fracture nor cephalohematoma.  CT MAXILLOFACIAL FINDINGS  There is mild soft tissues swelling over the right lateral face. There is mild preseptal edema on the right. The globes are intact. There are no air-fluid levels within the paranasal sinuses. There is a retention cyst or polyp in the inferior aspect of the right maxillary sinus.  There are deformities of the nasal bone without significant soft tissue swelling. The nasal septum is mildly deviated towards the right without evidence of nasal passage obstruction. The maxilla and mandible are intact. The pterygoid plates and zygomatic arches are intact. The bony orbits are intact. The paranasal sinus walls are intact.  CT CERVICAL SPINE FINDINGS  There is mild reversal of the normal cervical lordosis. The vertebral bodies are preserved in height. There is no perched facet nor facet or spinous process fracture. There is mild degenerative disc space narrowing at C5-6. The prevertebral soft tissue spaces are unremarkable where visualized. The odontoid is intact. A subtle cleft at the right base of the odontoid is demonstrated but was previously seen on a study of June 30, 2011. The visualized soft tissues of the neck reveal no acute abnormalities.  IMPRESSION: 1. There is no acute intracranial abnormality nor evidence of an acute skull fracture. 2. There is no acute facial bone fracture. There is a small hematoma over the lateral right frontal region with a small amount of adjacent preseptal edema. No abnormality of the globes or intraconal or extraconal soft tissues are demonstrated. No acute paranasal sinus abnormalities are demonstrated. There are old nasal bone fractures. 3. There is no acute cervical  spine fracture nor dislocation. Mild loss of the normal cervical lordosis likely reflects muscle spasm. There is mild degenerative disc change at C5-6.   Electronically Signed   By: David  Martinique   On: 11/12/2013 09:37   US Abdomen Complete  11/12/2013   CLINICAL DATA:  Abnormal liver function tests. Bowel resection. Prior drug addiction. Fatty liver.  EXAM: ULTRASOUND ABDOMEN COMPLETE  COMPARISON:  CT of 1 day prior  FINDINGS: Gallbladder: No gallstones or wall thickening visualized. No sonographic Murphy sign noted.  Common bile duct: Diameter: Normal, 5 mm.  Liver: Enlarged at 23 cm.  Increased echogenicity.  IVC: No abnormality visualized.  Pancreas: Visualized portion unremarkable.  Spleen: Size and appearance within normal limits.  Right Kidney: Length: 11.6 cm. Echogenicity within normal limits. No mass or hydronephrosis visualized.  Left Kidney: Length: 10.9 cm. Echogenicity within normal limits. No mass or hydronephrosis visualized.  Abdominal aorta: No aneurysm visualized.  Other findings: No ascites.  Exam degraded by overlying bowel gas and patient body habitus.  IMPRESSION: 1. Hepatic steatosis. 2. No other explanation for elevated liver function tests.   Electronically Signed   By: Abigail Miyamoto M.D.   On: 11/12/2013 17:31   Ct Abdomen Pelvis W Contrast  11/11/2013   CLINICAL DATA:  Right chest pain in the clavicle area after being kicked by horse 8 days ago. Diffuse abdominal pain for the past 2 days. Hematemesis for the past 2 days. Only able to tolerate drinking 1/2 bottle of contrast.  EXAM: CT CHEST, ABDOMEN, AND PELVIS WITH CONTRAST  TECHNIQUE: Multidetector CT imaging of the chest, abdomen and pelvis was performed following  the standard protocol during bolus administration of intravenous contrast.  CONTRAST:  79m OMNIPAQUE IOHEXOL 300 MG/ML SOLN, 1010mOMNIPAQUE IOHEXOL 300 MG/ML SOLN  COMPARISON:  Chest radiographs obtained yesterday. Chest CT dated 05/02/2013. Abdomen ultrasound dated  03/18/2010.  FINDINGS: CT CHEST FINDINGS  The lungs are clear. No fracture or pneumothorax. No pleural or mediastinal fluid. T11 limbus vertebra.  CT ABDOMEN AND PELVIS FINDINGS  Marked diffuse low density of the liver relative to the spleen. Normal appearing spleen, pancreas, gallbladder, adrenal glands, kidneys and urinary bladder. 1.1 cm rounded area of fluid density in the posterior aspect of a normal-sized prostate gland. Normal appearing seminal vesicles. No gastrointestinal abnormalities or enlarged lymph nodes. Normal appearing appendix. No lumbar spine fracture or subluxation. Small Schmorl's node in the superior aspect of the L2 vertebral body.  IMPRESSION: 1. No acute injury to the chest, abdomen or pelvis. 2. Marked diffuse hepatic steatosis. 3. 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   Electronically Signed   By: StEnrique Sack.D.   On: 11/11/2013 19:36   Ct Maxillofacial Wo Cm  11/12/2013   CLINICAL DATA:  Status post fall from a standing position striking the right-sided the patient and hand without loss of consciousness; bruising over the right face and night today; seizure like activity observed in the emergency department  EXAM: CT HEAD WITHOUT CONTRAST  CT MAXILLOFACIAL WITHOUT CONTRAST  CT CERVICAL SPINE WITHOUT CONTRAST  TECHNIQUE: Multidetector CT imaging of the head, cervical spine, and maxillofacial structures were performed using the standard protocol without intravenous contrast. Multiplanar CT image reconstructions of the cervical spine and maxillofacial structures were also generated.  COMPARISON:  CT scan of the brain dated May 12, 2012.  FINDINGS: CT HEAD FINDINGS  The ventricles are normal in size and position. There is no intracranial hemorrhage nor intracranial mass effect. There is no acute ischemic change. The cerebellum and brainstem are unremarkable.  The observed paranasal sinuses and mastoid air cells are clear. There is no  acute skull fracture nor cephalohematoma.  CT MAXILLOFACIAL FINDINGS  There is mild soft tissues swelling over the right lateral face. There is mild preseptal edema on the right. The globes are intact. There are no air-fluid levels within the paranasal sinuses. There is a retention cyst or polyp in the inferior aspect of the right maxillary sinus.  There are deformities of the nasal bone without significant soft tissue swelling. The nasal septum is mildly deviated towards the right without evidence of nasal passage obstruction. The maxilla and mandible are intact. The pterygoid plates and zygomatic arches are intact. The bony orbits are intact. The paranasal sinus walls are intact.  CT CERVICAL SPINE FINDINGS  There is mild reversal of the normal cervical lordosis. The vertebral bodies are preserved in height. There is no perched facet nor facet or spinous process fracture. There is mild degenerative disc space narrowing at C5-6. The prevertebral soft tissue spaces are unremarkable where visualized. The odontoid is intact. A subtle cleft at the right base of the odontoid is demonstrated but was previously seen on a study of June 30, 2011. The visualized soft tissues of the neck reveal no acute abnormalities.  IMPRESSION: 1. There is no acute intracranial abnormality nor evidence of an acute skull fracture. 2. There is no acute facial bone fracture. There is a small hematoma over the lateral right frontal region with a small amount of adjacent preseptal edema. No abnormality of the globes or intraconal  or extraconal soft tissues are demonstrated. No acute paranasal sinus abnormalities are demonstrated. There are old nasal bone fractures. 3. There is no acute cervical spine fracture nor dislocation. Mild loss of the normal cervical lordosis likely reflects muscle spasm. There is mild degenerative disc change at C5-6.   Electronically Signed   By: David  Martinique   On: 11/12/2013 09:37    Medications:  Prior to  Admission:  Prescriptions prior to admission  Medication Sig Dispense Refill Last Dose  . amoxicillin (AMOXIL) 500 MG capsule Take 2 capsules (1,000 mg total) by mouth 2 (two) times daily. 40 capsule 0 11/11/2013 at Unknown time  . Buprenorphine HCl-Naloxone HCl (SUBOXONE) 4-1 MG FILM Place 2 Film under the tongue daily.   11/11/2013 at Unknown time  . clonazePAM (KLONOPIN) 0.5 MG tablet Take 0.5 mg by mouth 2 (two) times daily.   11/06/2013  . DULoxetine (CYMBALTA) 30 MG capsule Take 30 mg by mouth daily.   11/11/2013 at Unknown time  . EPINEPHrine (EPIPEN 2-PAK) 0.3 mg/0.3 mL SOAJ injection Inject 0.3 mLs (0.3 mg total) into the muscle once. 1 Device 1 Unknown  . gabapentin (NEURONTIN) 400 MG capsule Take 400 mg by mouth 3 (three) times daily.   11/11/2013 at Unknown time  . ibuprofen (ADVIL,MOTRIN) 200 MG tablet Take 800 mg by mouth every 6 (six) hours as needed for pain.    11/11/2013 at Unknown time  . meloxicam (MOBIC) 7.5 MG tablet Take 1 tablet by mouth 2 (two) times daily.   11/11/2013 at Unknown time  . phenytoin (DILANTIN) 100 MG ER capsule Take 300 mg by mouth at bedtime.    11/11/2013 at Unknown time  . Pseudoeph-Doxylamine-DM-APAP (DAYQUIL/NYQUIL COLD/FLU RELIEF PO) Take 15-30 mLs by mouth daily as needed (cough).   11/11/2013 at Unknown time  . metroNIDAZOLE (FLAGYL) 500 MG tablet Take 1 tablet (500 mg total) by mouth 2 (two) times daily. (Patient not taking: Reported on 11/11/2013) 14 tablet 0   . ondansetron (ZOFRAN ODT) 4 MG disintegrating tablet 32m ODT q4 hours prn nausea/vomit (Patient not taking: Reported on 11/12/2013) 12 tablet 0   . oxyCODONE-acetaminophen (PERCOCET) 5-325 MG per tablet Take 1 tablet by mouth every 4 (four) hours as needed for moderate pain. (Patient not taking: Reported on 11/12/2013) 10 tablet 0 has not filled   Scheduled: . amoxicillin  500 mg Oral 3 times per day  . buprenorphine-naloxone  1 tablet Sublingual Daily  . DULoxetine  30 mg Oral Daily  . gabapentin   400 mg Oral TID  . Influenza vac split quadrivalent PF  0.5 mL Intramuscular Tomorrow-1000  . levETIRAcetam  500 mg Oral BID  . metoprolol succinate  100 mg Oral Daily  . nicotine  21 mg Transdermal Daily  . pneumococcal 23 valent vaccine  0.5 mL Intramuscular Tomorrow-1000   Continuous: . sodium chloride 125 mL/hr at 11/12/13 1521  . diltiazem (CARDIZEM) infusion Stopped (11/12/13 0930)   PBWI:OMBTDHRCBULAG**OR** acetaminophen, alum & mag hydroxide-simeth, clonazePAM, ondansetron **OR** ondansetron (ZOFRAN) IV  Assesment:he was admitted with seizure. He had atrial fibrillation with rapid ventricular response during and after the seizure. This has resolved. His blood pressure has been up. He is known to have posttraumatic stress disorder versus bipolar disorder versus both. He has what appears to me to be acute alcoholic hepatitis. He has a long known history of drug and alcohol abuse.  Principal Problem:   Atrial fibrillation with rapid ventricular response Active Problems:   Fatty liver   Post  traumatic stress disorder (PTSD)   Bipolar 1 disorder   Seizure   Atrial fibrillation with RVR   Thrombocytopenia   Hypokalemia   Hyponatremia   Abscessed tooth   Abnormal liver function test    Plan:I don't think he needs to stay in the intensive care unit. He will be transferred. Repeat laboratory work in the morning and if his liver enzymes are trending in the right direction he could be discharged    LOS: 1 day   Alyjah Lovingood L 11/13/2013, 8:22 AM

## 2013-11-13 NOTE — Progress Notes (Signed)
UR completed 

## 2013-11-13 NOTE — Progress Notes (Signed)
Patient ID: Dean [name], male   DOB: 1980-02-13, 33 y.o.   MRN: [id number]  Dean A. Merlene Laughter, MD     [email or web address]          Dean [name] is an 33 y.o. male.   Assessment/Plan: 1. Seizures. In the setting of intolerance of Dilantin and elevated liver enzymes, I agree with Keppra.  2. Polysubstance drug abuse. The patient has been restarted on Suboxone 8 mg a day. Alcohol cessation discussed again.  3. Anxiety disorder. The patient should be placed on low-dose clonazepam 0.5 mg on an as-needed basis no more than 3 a day.  4. Chronic pain syndrome. Suboxone should suffice.  5. DC in am.  FU 2 weeks.   Doing well. No SZ. No withdrawal for now.   GENERAL: He is in no acute distress.  HEENT: Supple. Mild bruising of the right forehead.  ABDOMEN: soft  EXTREMITIES: There is bruising of the left antecubital fossa and arm apparently from IV placement. The patient's seizure.  BACK: Normal.  SKIN: Normal by inspection.   MENTAL STATUS: Alert and oriented. Speech, language and cognition are generally intact. Judgment and insight normal.   CRANIAL NERVES: Pupils are equal, round and reactive to light and accommodation; extra ocular movements are full, there is no significant nystagmus; visual fields are full; upper and lower facial muscles are normal in strength and symmetric, there is no flattening of the nasolabial folds; tongue is midline; uvula is midline; shoulder elevation is normal.  MOTOR: Normal tone, bulk and strength; no pronator drift.  COORDINATION: Left finger to nose is normal, right finger to nose is normal, No rest tremor; no intention tremor; no postural tremor; no bradykinesia.  REFLEXES: Deep tendon reflexes are symmetrical and normal. Babinski reflexes are flexor bilaterally.   SENSATION: Normal to light touch.       Objective: Vital signs in last 24 hours: Temp:  [97.9 F (36.6 C)-99.2 F (37.3 C)] 98.8 F (37.1  C) (11/04 1444) Pulse Rate:  [71-87] 71 (11/04 1444) Resp:  [20-24] 20 (11/04 1444) BP: (148-155)/(101-115) 148/104 mmHg (11/04 1444) SpO2:  [94 %-95 %] 94 % (11/04 1444) Weight:  [91.8 kg (202 lb 6.1 oz)] 91.8 kg (202 lb 6.1 oz) (11/04 0500)  Intake/Output from previous day: 11/03 0701 - 11/04 0700 In: 81.3 [I.V.:81.3] Out: -  Intake/Output this shift:   Nutritional status: Diet regular   Lab Results: Results for orders placed or performed during the hospital encounter of 11/12/13 (from the past 48 hour(s))  CBC with Differential     Status: Abnormal   Collection Time: 11/12/13  6:02 AM  Result Value Ref Range   WBC 6.0 4.0 - 10.5 K/uL   RBC 4.61 4.22 - 5.81 MIL/uL   Hemoglobin 14.4 13.0 - 17.0 g/dL   HCT 41.4 39.0 - 52.0 %   MCV 89.8 78.0 - 100.0 fL   MCH 31.2 26.0 - 34.0 pg   MCHC 34.8 30.0 - 36.0 g/dL   RDW 12.0 11.5 - 15.5 %   Platelets 102 (L) 150 - 400 K/uL    Comment: SPECIMEN CHECKED FOR CLOTS PLATELET COUNT CONFIRMED BY SMEAR    Neutrophils Relative % 63 43 - 77 %   Neutro Abs 3.8 1.7 - 7.7 K/uL   Lymphocytes Relative 29 12 - 46 %   Lymphs Abs 1.8 0.7 - 4.0 K/uL   Monocytes Relative 4 3 - 12 %   Monocytes Absolute 0.3 0.1 - 1.0 K/uL  Eosinophils Relative 2 0 - 5 %   Eosinophils Absolute 0.1 0.0 - 0.7 K/uL   Basophils Relative 1 0 - 1 %   Basophils Absolute 0.1 0.0 - 0.1 K/uL  Comprehensive metabolic panel     Status: Abnormal   Collection Time: 11/12/13  6:02 AM  Result Value Ref Range   Sodium 135 (L) 137 - 147 mEq/L   Potassium 3.5 (L) 3.7 - 5.3 mEq/L   Chloride 89 (L) 96 - 112 mEq/L   CO2 16 (L) 19 - 32 mEq/L   Glucose, Bld 84 70 - 99 mg/dL   BUN 6 6 - 23 mg/dL   Creatinine, Ser 0.74 0.50 - 1.35 mg/dL   Calcium 8.6 8.4 - 10.5 mg/dL   Total Protein 7.6 6.0 - 8.3 g/dL   Albumin 4.0 3.5 - 5.2 g/dL   AST 582 (H) 0 - 37 U/L   ALT 269 (H) 0 - 53 U/L   Alkaline Phosphatase 117 39 - 117 U/L   Total Bilirubin 1.2 0.3 - 1.2 mg/dL   GFR calc non Af Amer  >90 >90 mL/min   GFR calc Af Amer >90 >90 mL/min    Comment: (NOTE) The eGFR has been calculated using the CKD EPI equation. This calculation has not been validated in all clinical situations. eGFR's persistently <90 mL/min signify possible Chronic Kidney Disease.   Ethanol     Status: Abnormal   Collection Time: 11/12/13  6:02 AM  Result Value Ref Range   Alcohol, Ethyl (B) 60 (H) 0 - 11 mg/dL    Comment:        LOWEST DETECTABLE LIMIT FOR SERUM ALCOHOL IS 11 mg/dL FOR MEDICAL PURPOSES ONLY   Phenytoin level, total     Status: Abnormal   Collection Time: 11/12/13  6:02 AM  Result Value Ref Range   Phenytoin Lvl <2.5 (L) 10.0 - 20.0 ug/mL  MRSA PCR Screening     Status: None   Collection Time: 11/12/13  9:12 AM  Result Value Ref Range   MRSA by PCR NEGATIVE NEGATIVE    Comment:        The GeneXpert MRSA Assay (FDA approved for NASAL specimens only), is one component of a comprehensive MRSA colonization surveillance program. It is not intended to diagnose MRSA infection nor to guide or monitor treatment for MRSA infections.   Magnesium     Status: None   Collection Time: 11/12/13  9:16 AM  Result Value Ref Range   Magnesium 2.1 1.5 - 2.5 mg/dL  TSH     Status: None   Collection Time: 11/12/13  9:16 AM  Result Value Ref Range   TSH 2.360 0.350 - 4.500 uIU/mL    Comment: Performed at [facility]  Ferritin     Status: Abnormal   Collection Time: 11/12/13  3:44 PM  Result Value Ref Range   Ferritin 2118 (H) 22 - 322 ng/mL    Comment: (NOTE) Result repeated and verified. Result confirmed by automatic dilution. Performed at [facility]   Hepatitis panel, acute     Status: None   Collection Time: 11/12/13  3:44 PM  Result Value Ref Range   Hepatitis B Surface Ag NEGATIVE NEGATIVE   HCV Ab NEGATIVE NEGATIVE   Hep A IgM NON REACTIVE NON REACTIVE    Comment: (NOTE) Effective November 25, 2013, Hepatitis Acute Panel (test code [phone]) will be revised  to automatically reflex to the Hepatitis C Viral RNA, Quantitative, Real-Time PCR assay if  the Hepatitis C antibody screening result is Reactive. This action is being taken to ensure that the CDC/USPSTF recommended HCV diagnostic algorithm with the appropriate test reflex needed for accurate interpretation is followed.    Hep B C IgM NON REACTIVE NON REACTIVE    Comment: (NOTE) High levels of Hepatitis B Core IgM antibody are detectable during the acute stage of Hepatitis B. This antibody is used to differentiate current from past HBV infection. Performed at [facility]     Status: None   Collection Time: 11/12/13  3:44 PM  Result Value Ref Range   Prothrombin Time 13.0 11.6 - 15.2 seconds   INR 0.97 0.00 - 1.49  Troponin I (q 6hr x 3)     Status: None   Collection Time: 11/12/13  3:44 PM  Result Value Ref Range   Troponin I <0.30 <0.30 ng/mL    Comment:        Due to the release kinetics of cTnI, a negative result within the first hours of the onset of symptoms does not rule out myocardial infarction with certainty. If myocardial infarction is still suspected, repeat the test at appropriate intervals.   Urine rapid drug screen (hosp performed)     Status: Abnormal   Collection Time: 11/12/13  6:35 PM  Result Value Ref Range   Opiates NONE DETECTED NONE DETECTED   Cocaine NONE DETECTED NONE DETECTED   Benzodiazepines NONE DETECTED NONE DETECTED   Amphetamines NONE DETECTED NONE DETECTED   Tetrahydrocannabinol NONE DETECTED NONE DETECTED   Barbiturates POSITIVE (A) NONE DETECTED    Comment:        DRUG SCREEN FOR MEDICAL PURPOSES ONLY.  IF CONFIRMATION IS NEEDED FOR ANY PURPOSE, NOTIFY LAB WITHIN 5 DAYS.        LOWEST DETECTABLE LIMITS FOR URINE DRUG SCREEN Drug Class       Cutoff (ng/mL) Amphetamine      1000 Barbiturate      200 Benzodiazepine   876 Tricyclics       811 Opiates          300 Cocaine          300 THC              50     Urinalysis, Routine w reflex microscopic     Status: Abnormal   Collection Time: 11/12/13  6:36 PM  Result Value Ref Range   Color, Urine YELLOW YELLOW   APPearance CLEAR CLEAR   Specific Gravity, Urine 1.010 1.005 - 1.030   pH 6.5 5.0 - 8.0   Glucose, UA NEGATIVE NEGATIVE mg/dL   Hgb urine dipstick NEGATIVE NEGATIVE   Bilirubin Urine MODERATE (A) NEGATIVE   Ketones, ur 15 (A) NEGATIVE mg/dL   Protein, ur NEGATIVE NEGATIVE mg/dL   Urobilinogen, UA 1.0 0.0 - 1.0 mg/dL   Nitrite NEGATIVE NEGATIVE   Leukocytes, UA NEGATIVE NEGATIVE    Comment: MICROSCOPIC NOT DONE ON URINES WITH NEGATIVE PROTEIN, BLOOD, LEUKOCYTES, NITRITE, OR GLUCOSE <1000 mg/dL.  Troponin I (q 6hr x 3)     Status: None   Collection Time: 11/12/13  8:59 PM  Result Value Ref Range   Troponin I <0.30 <0.30 ng/mL    Comment:        Due to the release kinetics of cTnI, a negative result within the first hours of the onset of symptoms does not rule out myocardial infarction with certainty. If myocardial infarction is still suspected, repeat the test at  appropriate intervals.   Troponin I (q 6hr x 3)     Status: None   Collection Time: 11/13/13  3:06 AM  Result Value Ref Range   Troponin I <0.30 <0.30 ng/mL    Comment:        Due to the release kinetics of cTnI, a negative result within the first hours of the onset of symptoms does not rule out myocardial infarction with certainty. If myocardial infarction is still suspected, repeat the test at appropriate intervals.   Comprehensive metabolic panel     Status: Abnormal   Collection Time: 11/13/13  3:06 AM  Result Value Ref Range   Sodium 136 (L) 137 - 147 mEq/L   Potassium 3.4 (L) 3.7 - 5.3 mEq/L   Chloride 96 96 - 112 mEq/L   CO2 26 19 - 32 mEq/L   Glucose, Bld 116 (H) 70 - 99 mg/dL   BUN 6 6 - 23 mg/dL   Creatinine, Ser 0.70 0.50 - 1.35 mg/dL   Calcium 8.9 8.4 - 10.5 mg/dL   Total Protein 6.8 6.0 - 8.3 g/dL   Albumin 3.4 (L) 3.5 - 5.2 g/dL   AST 653  (H) 0 - 37 U/L   ALT 258 (H) 0 - 53 U/L   Alkaline Phosphatase 120 (H) 39 - 117 U/L   Total Bilirubin 1.4 (H) 0.3 - 1.2 mg/dL   GFR calc non Af Amer >90 >90 mL/min   GFR calc Af Amer >90 >90 mL/min    Comment: (NOTE) The eGFR has been calculated using the CKD EPI equation. This calculation has not been validated in all clinical situations. eGFR's persistently <90 mL/min signify possible Chronic Kidney Disease.    Anion gap 14 5 - 15  CBC     Status: Abnormal   Collection Time: 11/13/13  3:06 AM  Result Value Ref Range   WBC 3.6 (L) 4.0 - 10.5 K/uL   RBC 4.58 4.22 - 5.81 MIL/uL   Hemoglobin 14.2 13.0 - 17.0 g/dL   HCT 41.4 39.0 - 52.0 %   MCV 90.4 78.0 - 100.0 fL   MCH 31.0 26.0 - 34.0 pg   MCHC 34.3 30.0 - 36.0 g/dL   RDW 12.2 11.5 - 15.5 %   Platelets 67 (L) 150 - 400 K/uL    Comment: RESULT REPEATED AND VERIFIED DELTA CHECK NOTED SPECIMEN CHECKED FOR CLOTS PLATELETS APPEAR DECREASED SMEAR STAINED AND AVAILABLE FOR REVIEW   Phenytoin level, total     Status: Abnormal   Collection Time: 11/13/13  3:06 AM  Result Value Ref Range   Phenytoin Lvl 9.1 (L) 10.0 - 20.0 ug/mL    Lipid Panel No results for input(s): CHOL, TRIG, HDL, CHOLHDL, VLDL, LDLCALC in the last 72 hours.  Studies/Results:   Medications:  Scheduled Meds: . amoxicillin  500 mg Oral 3 times per day  . buprenorphine-naloxone  1 tablet Sublingual Daily  . DULoxetine  30 mg Oral Daily  . folic acid  1 mg Oral Daily  . gabapentin  400 mg Oral TID  . levETIRAcetam  500 mg Oral BID  . metoprolol succinate  100 mg Oral Daily  . multivitamin with minerals  1 tablet Oral Daily  . nicotine  21 mg Transdermal Daily  . thiamine  100 mg Oral Daily   Or  . thiamine  100 mg Intravenous Daily   Continuous Infusions: . sodium chloride 125 mL/hr at 11/12/13 1521  . diltiazem (CARDIZEM) infusion Stopped (11/12/13 0930)   PRN Meds:.acetaminophen **  OR** acetaminophen, alum & mag hydroxide-simeth, clonazePAM,  LORazepam **OR** LORazepam, ondansetron **OR** ondansetron (ZOFRAN) IV     LOS: 1 day   Dean [name], M.D.  Diplomate, [employer] of Psychiatry and Neurology ( Neurology).

## 2013-11-14 DIAGNOSIS — K7010 Alcoholic hepatitis without ascites: Secondary | ICD-10-CM | POA: Diagnosis not present

## 2013-11-14 DIAGNOSIS — R569 Unspecified convulsions: Secondary | ICD-10-CM | POA: Diagnosis not present

## 2013-11-14 DIAGNOSIS — G40909 Epilepsy, unspecified, not intractable, without status epilepticus: Secondary | ICD-10-CM | POA: Diagnosis not present

## 2013-11-14 LAB — HEPATIC FUNCTION PANEL
ALT: 263 U/L — ABNORMAL HIGH (ref 0–53)
AST: 463 U/L — ABNORMAL HIGH (ref 0–37)
Albumin: 3.5 g/dL (ref 3.5–5.2)
Alkaline Phosphatase: 163 U/L — ABNORMAL HIGH (ref 39–117)
Bilirubin, Direct: 0.4 mg/dL — ABNORMAL HIGH (ref 0.0–0.3)
Indirect Bilirubin: 0.8 mg/dL (ref 0.3–0.9)
Total Bilirubin: 1.2 mg/dL (ref 0.3–1.2)
Total Protein: 6.9 g/dL (ref 6.0–8.3)

## 2013-11-14 MED ORDER — ADULT MULTIVITAMIN W/MINERALS CH: 1.0000 | ORAL_TABLET | Freq: Every day | ORAL | Status: DC

## 2013-11-14 MED ORDER — CLONAZEPAM 0.5 MG PO TABS: 0.5000 mg | ORAL_TABLET | Freq: Three times a day (TID) | ORAL | Status: DC | PRN

## 2013-11-14 MED ORDER — LEVETIRACETAM 500 MG PO TABS
500.0000 mg | ORAL_TABLET | Freq: Two times a day (BID) | ORAL | Status: DC
Start: 2013-11-14 — End: 2014-08-10

## 2013-11-14 MED ORDER — METOPROLOL SUCCINATE ER 100 MG PO TB24: 100.0000 mg | ORAL_TABLET | Freq: Every day | ORAL | Status: DC

## 2013-11-14 NOTE — Progress Notes (Signed)
Subjective: He says he feels better. He has not had any seizures. His liver function is improving  Objective: Vital signs in last 24 hours: Temp:  [98 F (36.7 C)-98.9 F (37.2 C)] 98.7 F (37.1 C) (11/05 0259) Pulse Rate:  [68-87] 68 (11/05 0259) Resp:  [20-22] 20 (11/05 0259) BP: (137-155)/(85-115) 137/85 mmHg (11/05 0259) SpO2:  [94 %-97 %] 97 % (11/05 0259) Weight:  [96 kg (211 lb 10.3 oz)] 96 kg (211 lb 10.3 oz) (11/05 0534) Weight change: 4.2 kg (9 lb 4.2 oz) Last BM Date: 11/13/13  Intake/Output from previous day: 11/04 0701 - 11/05 0700 In: 240 [P.O.:240] Out: -   PHYSICAL EXAM General appearance: alert, cooperative and no distress Resp: clear to auscultation bilaterally Cardio: regular rate and rhythm, S1, S2 normal, no murmur, click, rub or gallop GI: soft, non-tender; bowel sounds normal; no masses,  no organomegaly Extremities: extremities normal, atraumatic, no cyanosis or edema  Lab Results:  Results for orders placed or performed during the hospital encounter of 11/12/13 (from the past 48 hour(s))  MRSA PCR Screening     Status: None   Collection Time: 11/12/13  9:12 AM  Result Value Ref Range   MRSA by PCR NEGATIVE NEGATIVE    Comment:        The GeneXpert MRSA Assay (FDA approved for NASAL specimens only), is one component of a comprehensive MRSA colonization surveillance program. It is not intended to diagnose MRSA infection nor to guide or monitor treatment for MRSA infections.   Magnesium     Status: None   Collection Time: 11/12/13  9:16 AM  Result Value Ref Range   Magnesium 2.1 1.5 - 2.5 mg/dL  TSH     Status: None   Collection Time: 11/12/13  9:16 AM  Result Value Ref Range   TSH 2.360 0.350 - 4.500 uIU/mL    Comment: Performed at [facility]  Ferritin     Status: Abnormal   Collection Time: 11/12/13  3:44 PM  Result Value Ref Range   Ferritin 2118 (H) 22 - 322 ng/mL    Comment: (NOTE) Result repeated and verified. Result  confirmed by automatic dilution. Performed at [facility]   Hepatitis panel, acute     Status: None   Collection Time: 11/12/13  3:44 PM  Result Value Ref Range   Hepatitis B Surface Ag NEGATIVE NEGATIVE   HCV Ab NEGATIVE NEGATIVE   Hep A IgM NON REACTIVE NON REACTIVE    Comment: (NOTE) Effective November 25, 2013, Hepatitis Acute Panel (test code [phone]) will be revised to automatically reflex to the Hepatitis C Viral RNA, Quantitative, Real-Time PCR assay if the Hepatitis C antibody screening result is Reactive. This action is being taken to ensure that the CDC/USPSTF recommended HCV diagnostic algorithm with the appropriate test reflex needed for accurate interpretation is followed.    Hep B C IgM NON REACTIVE NON REACTIVE    Comment: (NOTE) High levels of Hepatitis B Core IgM antibody are detectable during the acute stage of Hepatitis B. This antibody is used to differentiate current from past HBV infection. Performed at [facility]     Status: None   Collection Time: 11/12/13  3:44 PM  Result Value Ref Range   Prothrombin Time 13.0 11.6 - 15.2 seconds   INR 0.97 0.00 - 1.49  Troponin I (q 6hr x 3)     Status: None   Collection Time: 11/12/13  3:44 PM  Result Value  Ref Range   Troponin I <0.30 <0.30 ng/mL    Comment:        Due to the release kinetics of cTnI, a negative result within the first hours of the onset of symptoms does not rule out myocardial infarction with certainty. If myocardial infarction is still suspected, repeat the test at appropriate intervals.   Urine rapid drug screen (hosp performed)     Status: Abnormal   Collection Time: 11/12/13  6:35 PM  Result Value Ref Range   Opiates NONE DETECTED NONE DETECTED   Cocaine NONE DETECTED NONE DETECTED   Benzodiazepines NONE DETECTED NONE DETECTED   Amphetamines NONE DETECTED NONE DETECTED   Tetrahydrocannabinol NONE DETECTED NONE DETECTED   Barbiturates POSITIVE (A) NONE  DETECTED    Comment:        DRUG SCREEN FOR MEDICAL PURPOSES ONLY.  IF CONFIRMATION IS NEEDED FOR ANY PURPOSE, NOTIFY LAB WITHIN 5 DAYS.        LOWEST DETECTABLE LIMITS FOR URINE DRUG SCREEN Drug Class       Cutoff (ng/mL) Amphetamine      1000 Barbiturate      200 Benzodiazepine   161 Tricyclics       096 Opiates          300 Cocaine          300 THC              50   Urinalysis, Routine w reflex microscopic     Status: Abnormal   Collection Time: 11/12/13  6:36 PM  Result Value Ref Range   Color, Urine YELLOW YELLOW   APPearance CLEAR CLEAR   Specific Gravity, Urine 1.010 1.005 - 1.030   pH 6.5 5.0 - 8.0   Glucose, UA NEGATIVE NEGATIVE mg/dL   Hgb urine dipstick NEGATIVE NEGATIVE   Bilirubin Urine MODERATE (A) NEGATIVE   Ketones, ur 15 (A) NEGATIVE mg/dL   Protein, ur NEGATIVE NEGATIVE mg/dL   Urobilinogen, UA 1.0 0.0 - 1.0 mg/dL   Nitrite NEGATIVE NEGATIVE   Leukocytes, UA NEGATIVE NEGATIVE    Comment: MICROSCOPIC NOT DONE ON URINES WITH NEGATIVE PROTEIN, BLOOD, LEUKOCYTES, NITRITE, OR GLUCOSE <1000 mg/dL.  Troponin I (q 6hr x 3)     Status: None   Collection Time: 11/12/13  8:59 PM  Result Value Ref Range   Troponin I <0.30 <0.30 ng/mL    Comment:        Due to the release kinetics of cTnI, a negative result within the first hours of the onset of symptoms does not rule out myocardial infarction with certainty. If myocardial infarction is still suspected, repeat the test at appropriate intervals.   Troponin I (q 6hr x 3)     Status: None   Collection Time: 11/13/13  3:06 AM  Result Value Ref Range   Troponin I <0.30 <0.30 ng/mL    Comment:        Due to the release kinetics of cTnI, a negative result within the first hours of the onset of symptoms does not rule out myocardial infarction with certainty. If myocardial infarction is still suspected, repeat the test at appropriate intervals.   Comprehensive metabolic panel     Status: Abnormal   Collection  Time: 11/13/13  3:06 AM  Result Value Ref Range   Sodium 136 ([name]) 137 - 147 mEq/[name]   Potassium 3.4 ([name]) 3.7 - 5.3 mEq/[name]   Chloride 96 96 - 112 mEq/[name]   CO2 26 19 - 32 mEq/[name]  Glucose, Bld 116 (H) 70 - 99 mg/dL   BUN 6 6 - 23 mg/dL   Creatinine, Ser 0.70 0.50 - 1.35 mg/dL   Calcium 8.9 8.4 - 10.5 mg/dL   Total Protein 6.8 6.0 - 8.3 g/dL   Albumin 3.4 ([name]) 3.5 - 5.2 g/dL   AST 653 (H) 0 - 37 U/[name]   ALT 258 (H) 0 - 53 U/[name]   Alkaline Phosphatase 120 (H) 39 - 117 U/[name]   Total Bilirubin 1.4 (H) 0.3 - 1.2 mg/dL   GFR calc non Af Amer >90 >90 mL/min   GFR calc Af Amer >90 >90 mL/min    Comment: (NOTE) The eGFR has been calculated using the CKD EPI equation. This calculation has not been validated in all clinical situations. eGFR's persistently <90 mL/min signify possible Chronic Kidney Disease.    Anion gap 14 5 - 15  CBC     Status: Abnormal   Collection Time: 11/13/13  3:06 AM  Result Value Ref Range   WBC 3.6 ([name]) 4.0 - 10.5 K/uL   RBC 4.58 4.22 - 5.81 MIL/uL   Hemoglobin 14.2 13.0 - 17.0 g/dL   HCT 41.4 39.0 - 52.0 %   MCV 90.4 78.0 - 100.0 fL   MCH 31.0 26.0 - 34.0 pg   MCHC 34.3 30.0 - 36.0 g/dL   RDW 12.2 11.5 - 15.5 %   Platelets 67 ([name]) 150 - 400 K/uL    Comment: RESULT REPEATED AND VERIFIED DELTA CHECK NOTED SPECIMEN CHECKED FOR CLOTS PLATELETS APPEAR DECREASED SMEAR STAINED AND AVAILABLE FOR REVIEW   Phenytoin level, total     Status: Abnormal   Collection Time: 11/13/13  3:06 AM  Result Value Ref Range   Phenytoin Lvl 9.1 ([name]) 10.0 - 20.0 ug/mL  Hepatic function panel     Status: Abnormal   Collection Time: 11/14/13  5:28 AM  Result Value Ref Range   Total Protein 6.9 6.0 - 8.3 g/dL   Albumin 3.5 3.5 - 5.2 g/dL   AST 463 (H) 0 - 37 U/[name]   ALT 263 (H) 0 - 53 U/[name]   Alkaline Phosphatase 163 (H) 39 - 117 U/[name]   Total Bilirubin 1.2 0.3 - 1.2 mg/dL   Bilirubin, Direct 0.4 (H) 0.0 - 0.3 mg/dL   Indirect Bilirubin 0.8 0.3 - 0.9 mg/dL    ABGS No results for input(s):  PHART, PO2ART, TCO2, HCO3 in the last 72 hours.  Invalid input(s): PCO2 CULTURES Recent Results (from the past 240 hour(s))  MRSA PCR Screening     Status: None   Collection Time: 11/12/13  9:12 AM  Result Value Ref Range Status   MRSA by PCR NEGATIVE NEGATIVE Final    Comment:        The GeneXpert MRSA Assay (FDA approved for NASAL specimens only), is one component of a comprehensive MRSA colonization surveillance program. It is not intended to diagnose MRSA infection nor to guide or monitor treatment for MRSA infections.    Studies/Results: Ct Head Wo Contrast  11/12/2013   CLINICAL DATA:  Status post fall from a standing position striking the right-sided the patient and hand without loss of consciousness; bruising over the right face and night today; seizure like activity observed in the emergency department  EXAM: CT HEAD WITHOUT CONTRAST  CT MAXILLOFACIAL WITHOUT CONTRAST  CT CERVICAL SPINE WITHOUT CONTRAST  TECHNIQUE: Multidetector CT imaging of the head, cervical spine, and maxillofacial structures were performed using the standard protocol without intravenous contrast. Multiplanar CT  image reconstructions of the cervical spine and maxillofacial structures were also generated.  COMPARISON:  CT scan of the brain dated May 12, 2012.  FINDINGS: CT HEAD FINDINGS  The ventricles are normal in size and position. There is no intracranial hemorrhage nor intracranial mass effect. There is no acute ischemic change. The cerebellum and brainstem are unremarkable.  The observed paranasal sinuses and mastoid air cells are clear. There is no acute skull fracture nor cephalohematoma.  CT MAXILLOFACIAL FINDINGS  There is mild soft tissues swelling over the right lateral face. There is mild preseptal edema on the right. The globes are intact. There are no air-fluid levels within the paranasal sinuses. There is a retention cyst or polyp in the inferior aspect of the right maxillary sinus.  There are  deformities of the nasal bone without significant soft tissue swelling. The nasal septum is mildly deviated towards the right without evidence of nasal passage obstruction. The maxilla and mandible are intact. The pterygoid plates and zygomatic arches are intact. The bony orbits are intact. The paranasal sinus walls are intact.  CT CERVICAL SPINE FINDINGS  There is mild reversal of the normal cervical lordosis. The vertebral bodies are preserved in height. There is no perched facet nor facet or spinous process fracture. There is mild degenerative disc space narrowing at C5-6. The prevertebral soft tissue spaces are unremarkable where visualized. The odontoid is intact. A subtle cleft at the right base of the odontoid is demonstrated but was previously seen on a study of June 30, 2011. The visualized soft tissues of the neck reveal no acute abnormalities.  IMPRESSION: 1. There is no acute intracranial abnormality nor evidence of an acute skull fracture. 2. There is no acute facial bone fracture. There is a small hematoma over the lateral right frontal region with a small amount of adjacent preseptal edema. No abnormality of the globes or intraconal or extraconal soft tissues are demonstrated. No acute paranasal sinus abnormalities are demonstrated. There are old nasal bone fractures. 3. There is no acute cervical spine fracture nor dislocation. Mild loss of the normal cervical lordosis likely reflects muscle spasm. There is mild degenerative disc change at C5-6.   Electronically Signed   By: David  Martinique   On: 11/12/2013 09:37   Ct Cervical Spine Wo Contrast  11/12/2013   CLINICAL DATA:  Status post fall from a standing position striking the right-sided the patient and hand without loss of consciousness; bruising over the right face and night today; seizure like activity observed in the emergency department  EXAM: CT HEAD WITHOUT CONTRAST  CT MAXILLOFACIAL WITHOUT CONTRAST  CT CERVICAL SPINE WITHOUT CONTRAST   TECHNIQUE: Multidetector CT imaging of the head, cervical spine, and maxillofacial structures were performed using the standard protocol without intravenous contrast. Multiplanar CT image reconstructions of the cervical spine and maxillofacial structures were also generated.  COMPARISON:  CT scan of the brain dated May 12, 2012.  FINDINGS: CT HEAD FINDINGS  The ventricles are normal in size and position. There is no intracranial hemorrhage nor intracranial mass effect. There is no acute ischemic change. The cerebellum and brainstem are unremarkable.  The observed paranasal sinuses and mastoid air cells are clear. There is no acute skull fracture nor cephalohematoma.  CT MAXILLOFACIAL FINDINGS  There is mild soft tissues swelling over the right lateral face. There is mild preseptal edema on the right. The globes are intact. There are no air-fluid levels within the paranasal sinuses. There is a retention cyst or polyp in  the inferior aspect of the right maxillary sinus.  There are deformities of the nasal bone without significant soft tissue swelling. The nasal septum is mildly deviated towards the right without evidence of nasal passage obstruction. The maxilla and mandible are intact. The pterygoid plates and zygomatic arches are intact. The bony orbits are intact. The paranasal sinus walls are intact.  CT CERVICAL SPINE FINDINGS  There is mild reversal of the normal cervical lordosis. The vertebral bodies are preserved in height. There is no perched facet nor facet or spinous process fracture. There is mild degenerative disc space narrowing at C5-6. The prevertebral soft tissue spaces are unremarkable where visualized. The odontoid is intact. A subtle cleft at the right base of the odontoid is demonstrated but was previously seen on a study of June 30, 2011. The visualized soft tissues of the neck reveal no acute abnormalities.  IMPRESSION: 1. There is no acute intracranial abnormality nor evidence of an acute skull  fracture. 2. There is no acute facial bone fracture. There is a small hematoma over the lateral right frontal region with a small amount of adjacent preseptal edema. No abnormality of the globes or intraconal or extraconal soft tissues are demonstrated. No acute paranasal sinus abnormalities are demonstrated. There are old nasal bone fractures. 3. There is no acute cervical spine fracture nor dislocation. Mild loss of the normal cervical lordosis likely reflects muscle spasm. There is mild degenerative disc change at C5-6.   Electronically Signed   By: David  Martinique   On: 11/12/2013 09:37   US Abdomen Complete  11/12/2013   CLINICAL DATA:  Abnormal liver function tests. Bowel resection. Prior drug addiction. Fatty liver.  EXAM: ULTRASOUND ABDOMEN COMPLETE  COMPARISON:  CT of 1 day prior  FINDINGS: Gallbladder: No gallstones or wall thickening visualized. No sonographic Murphy sign noted.  Common bile duct: Diameter: Normal, 5 mm.  Liver: Enlarged at 23 cm.  Increased echogenicity.  IVC: No abnormality visualized.  Pancreas: Visualized portion unremarkable.  Spleen: Size and appearance within normal limits.  Right Kidney: Length: 11.6 cm. Echogenicity within normal limits. No mass or hydronephrosis visualized.  Left Kidney: Length: 10.9 cm. Echogenicity within normal limits. No mass or hydronephrosis visualized.  Abdominal aorta: No aneurysm visualized.  Other findings: No ascites.  Exam degraded by overlying bowel gas and patient body habitus.  IMPRESSION: 1. Hepatic steatosis. 2. No other explanation for elevated liver function tests.   Electronically Signed   By: Abigail Miyamoto M.D.   On: 11/12/2013 17:31   Ct Maxillofacial Wo Cm  11/12/2013   CLINICAL DATA:  Status post fall from a standing position striking the right-sided the patient and hand without loss of consciousness; bruising over the right face and night today; seizure like activity observed in the emergency department  EXAM: CT HEAD WITHOUT CONTRAST   CT MAXILLOFACIAL WITHOUT CONTRAST  CT CERVICAL SPINE WITHOUT CONTRAST  TECHNIQUE: Multidetector CT imaging of the head, cervical spine, and maxillofacial structures were performed using the standard protocol without intravenous contrast. Multiplanar CT image reconstructions of the cervical spine and maxillofacial structures were also generated.  COMPARISON:  CT scan of the brain dated May 12, 2012.  FINDINGS: CT HEAD FINDINGS  The ventricles are normal in size and position. There is no intracranial hemorrhage nor intracranial mass effect. There is no acute ischemic change. The cerebellum and brainstem are unremarkable.  The observed paranasal sinuses and mastoid air cells are clear. There is no acute skull fracture nor cephalohematoma.  CT  MAXILLOFACIAL FINDINGS  There is mild soft tissues swelling over the right lateral face. There is mild preseptal edema on the right. The globes are intact. There are no air-fluid levels within the paranasal sinuses. There is a retention cyst or polyp in the inferior aspect of the right maxillary sinus.  There are deformities of the nasal bone without significant soft tissue swelling. The nasal septum is mildly deviated towards the right without evidence of nasal passage obstruction. The maxilla and mandible are intact. The pterygoid plates and zygomatic arches are intact. The bony orbits are intact. The paranasal sinus walls are intact.  CT CERVICAL SPINE FINDINGS  There is mild reversal of the normal cervical lordosis. The vertebral bodies are preserved in height. There is no perched facet nor facet or spinous process fracture. There is mild degenerative disc space narrowing at C5-6. The prevertebral soft tissue spaces are unremarkable where visualized. The odontoid is intact. A subtle cleft at the right base of the odontoid is demonstrated but was previously seen on a study of June 30, 2011. The visualized soft tissues of the neck reveal no acute abnormalities.  IMPRESSION: 1.  There is no acute intracranial abnormality nor evidence of an acute skull fracture. 2. There is no acute facial bone fracture. There is a small hematoma over the lateral right frontal region with a small amount of adjacent preseptal edema. No abnormality of the globes or intraconal or extraconal soft tissues are demonstrated. No acute paranasal sinus abnormalities are demonstrated. There are old nasal bone fractures. 3. There is no acute cervical spine fracture nor dislocation. Mild loss of the normal cervical lordosis likely reflects muscle spasm. There is mild degenerative disc change at C5-6.   Electronically Signed   By: David  Martinique   On: 11/12/2013 09:37    Medications:  Prior to Admission:  Prescriptions prior to admission  Medication Sig Dispense Refill Last Dose  . amoxicillin (AMOXIL) 500 MG capsule Take 2 capsules (1,000 mg total) by mouth 2 (two) times daily. 40 capsule 0 11/11/2013 at Unknown time  . Buprenorphine HCl-Naloxone HCl (SUBOXONE) 4-1 MG FILM Place 2 Film under the tongue daily.   11/11/2013 at Unknown time  . clonazePAM (KLONOPIN) 0.5 MG tablet Take 0.5 mg by mouth 2 (two) times daily.   11/06/2013  . DULoxetine (CYMBALTA) 30 MG capsule Take 30 mg by mouth daily.   11/11/2013 at Unknown time  . EPINEPHrine (EPIPEN 2-PAK) 0.3 mg/0.3 mL SOAJ injection Inject 0.3 mLs (0.3 mg total) into the muscle once. 1 Device 1 Unknown  . gabapentin (NEURONTIN) 400 MG capsule Take 400 mg by mouth 3 (three) times daily.   11/11/2013 at Unknown time  . ibuprofen (ADVIL,MOTRIN) 200 MG tablet Take 800 mg by mouth every 6 (six) hours as needed for pain.    11/11/2013 at Unknown time  . meloxicam (MOBIC) 7.5 MG tablet Take 1 tablet by mouth 2 (two) times daily.   11/11/2013 at Unknown time  . phenytoin (DILANTIN) 100 MG ER capsule Take 300 mg by mouth at bedtime.    11/11/2013 at Unknown time  . Pseudoeph-Doxylamine-DM-APAP (DAYQUIL/NYQUIL COLD/FLU RELIEF PO) Take 15-30 mLs by mouth daily as needed  (cough).   11/11/2013 at Unknown time  . metroNIDAZOLE (FLAGYL) 500 MG tablet Take 1 tablet (500 mg total) by mouth 2 (two) times daily. (Patient not taking: Reported on 11/11/2013) 14 tablet 0   . ondansetron (ZOFRAN ODT) 4 MG disintegrating tablet 12m ODT q4 hours prn nausea/vomit (Patient not taking:  Reported on 11/12/2013) 12 tablet 0   . oxyCODONE-acetaminophen (PERCOCET) 5-325 MG per tablet Take 1 tablet by mouth every 4 (four) hours as needed for moderate pain. (Patient not taking: Reported on 11/12/2013) 10 tablet 0 has not filled   Scheduled: . amoxicillin  500 mg Oral 3 times per day  . buprenorphine-naloxone  1 tablet Sublingual Daily  . DULoxetine  30 mg Oral Daily  . folic acid  1 mg Oral Daily  . gabapentin  400 mg Oral TID  . levETIRAcetam  500 mg Oral BID  . metoprolol succinate  100 mg Oral Daily  . multivitamin with minerals  1 tablet Oral Daily  . nicotine  21 mg Transdermal Daily  . thiamine  100 mg Oral Daily   Or  . thiamine  100 mg Intravenous Daily   Continuous: . sodium chloride 125 mL/hr at 11/12/13 1521  . diltiazem (CARDIZEM) infusion Stopped (11/12/13 0930)   [id number] **OR** acetaminophen, alum & mag hydroxide-simeth, clonazePAM, LORazepam **OR** LORazepam, ondansetron **OR** ondansetron (ZOFRAN) IV  Assesment:he was admitted with atrial fibrillation which has resolved. This may have been related to acute alcohol intoxication and seizure. He has what appears to be alcoholic hepatitis. He has mental illness variously described as posttraumatic stress disorder or bipolar 1 or both. He has chronic pain and chronic abuse of pain medications he is being followed in the pain clinic now.  Principal Problem:   Atrial fibrillation with rapid ventricular response Active Problems:   Fatty liver   Post traumatic stress disorder (PTSD)   Bipolar 1 disorder   Seizure   Atrial fibrillation with RVR   Thrombocytopenia   Hypokalemia   Hyponatremia   Abscessed  tooth   Abnormal liver function test    Plan:I think he can be discharged. He says that Dr. Merlene Laughter wants to arrange him getting his pain medication before he leaves so I will discuss that with him    LOS: 2 days   Dean [name] 11/14/2013, 8:54 AM

## 2013-11-14 NOTE — Progress Notes (Signed)
Discharge instructions given, verbalized understanding, out in stable condition ambulatory with staff. 

## 2013-11-17 NOTE — Discharge Summary (Signed)
Physician Discharge Summary  Patient ID: Dean [name] MRN: [id number] DOB/AGE: 1980/11/11 33 y.o. Primary Care Physician:Jacklyne Baik L, MD Admit date: 11/12/2013 Discharge date: 11/17/2013    Discharge Diagnoses:   Principal Problem:   Atrial fibrillation with rapid ventricular response Active Problems:   Fatty liver   Post traumatic stress disorder (PTSD)   Bipolar 1 disorder   Seizure   Atrial fibrillation with RVR   Thrombocytopenia   Hypokalemia   Hyponatremia   Abscessed tooth   Abnormal liver function test chronic drug and alcohol abuse Alcoholic hepatitis   Medication List    STOP taking these medications        DAYQUIL/NYQUIL COLD/FLU RELIEF PO     meloxicam 7.5 MG tablet  Commonly known as:  MOBIC     phenytoin 100 MG ER capsule  Commonly known as:  DILANTIN      TAKE these medications        amoxicillin 500 MG capsule  Commonly known as:  AMOXIL  Take 2 capsules (1,000 mg total) by mouth 2 (two) times daily.     clonazePAM 0.5 MG tablet  Commonly known as:  KLONOPIN  Take 1 tablet (0.5 mg total) by mouth 3 (three) times daily as needed (anxiety).     DULoxetine 30 MG capsule  Commonly known as:  CYMBALTA  Take 30 mg by mouth daily.     EPINEPHrine 0.3 mg/0.3 mL Soaj injection  Commonly known as:  EPIPEN 2-PAK  Inject 0.3 mLs (0.3 mg total) into the muscle once.     gabapentin 400 MG capsule  Commonly known as:  NEURONTIN  Take 400 mg by mouth 3 (three) times daily.     ibuprofen 200 MG tablet  Commonly known as:  ADVIL,MOTRIN  Take 800 mg by mouth every 6 (six) hours as needed for pain.     levETIRAcetam 500 MG tablet  Commonly known as:  KEPPRA  Take 1 tablet (500 mg total) by mouth 2 (two) times daily.     metoprolol succinate 100 MG 24 hr tablet  Commonly known as:  TOPROL-XL  Take 1 tablet (100 mg total) by mouth daily. Take with or immediately following a meal.     metroNIDAZOLE 500 MG tablet  Commonly known as:  FLAGYL   Take 1 tablet (500 mg total) by mouth 2 (two) times daily.     multivitamin with minerals Tabs tablet  Take 1 tablet by mouth daily.     ondansetron 4 MG disintegrating tablet  Commonly known as:  ZOFRAN ODT  4mg  ODT q4 hours prn nausea/vomit     oxyCODONE-acetaminophen 5-325 MG per tablet  Commonly known as:  PERCOCET  Take 1 tablet by mouth every 4 (four) hours as needed for moderate pain.     SUBOXONE 4-1 MG Film  Generic drug:  Buprenorphine HCl-Naloxone HCl  Place 2 Film under the tongue daily.        Discharged Condition:improved    Consults:neurology  Significant Diagnostic Studies: Dg Chest 2 View  11/10/2013   CLINICAL DATA:  Patient kicked in right chest and shoulder area with pain across anterior chest. Initial encounter.  EXAM: CHEST - 2 VIEW  COMPARISON:  03/13/2010  FINDINGS: The heart size and mediastinal contours are within normal limits. There is no evidence of pulmonary edema, consolidation, pneumothorax, nodule or pleural fluid. The visualized skeletal structures are unremarkable.  IMPRESSION: No acute findings.   Electronically Signed   By: Irish Lack M.D.   On:  11/10/2013 08:30   Ct Head Wo Contrast  11/12/2013   CLINICAL DATA:  Status post fall from a standing position striking the right-sided the patient and hand without loss of consciousness; bruising over the right face and night today; seizure like activity observed in the emergency department  EXAM: CT HEAD WITHOUT CONTRAST  CT MAXILLOFACIAL WITHOUT CONTRAST  CT CERVICAL SPINE WITHOUT CONTRAST  TECHNIQUE: Multidetector CT imaging of the head, cervical spine, and maxillofacial structures were performed using the standard protocol without intravenous contrast. Multiplanar CT image reconstructions of the cervical spine and maxillofacial structures were also generated.  COMPARISON:  CT scan of the brain dated May 12, 2012.  FINDINGS: CT HEAD FINDINGS  The ventricles are normal in size and position. There is  no intracranial hemorrhage nor intracranial mass effect. There is no acute ischemic change. The cerebellum and brainstem are unremarkable.  The observed paranasal sinuses and mastoid air cells are clear. There is no acute skull fracture nor cephalohematoma.  CT MAXILLOFACIAL FINDINGS  There is mild soft tissues swelling over the right lateral face. There is mild preseptal edema on the right. The globes are intact. There are no air-fluid levels within the paranasal sinuses. There is a retention cyst or polyp in the inferior aspect of the right maxillary sinus.  There are deformities of the nasal bone without significant soft tissue swelling. The nasal septum is mildly deviated towards the right without evidence of nasal passage obstruction. The maxilla and mandible are intact. The pterygoid plates and zygomatic arches are intact. The bony orbits are intact. The paranasal sinus walls are intact.  CT CERVICAL SPINE FINDINGS  There is mild reversal of the normal cervical lordosis. The vertebral bodies are preserved in height. There is no perched facet nor facet or spinous process fracture. There is mild degenerative disc space narrowing at C5-6. The prevertebral soft tissue spaces are unremarkable where visualized. The odontoid is intact. A subtle cleft at the right base of the odontoid is demonstrated but was previously seen on a study of June 30, 2011. The visualized soft tissues of the neck reveal no acute abnormalities.  IMPRESSION: 1. There is no acute intracranial abnormality nor evidence of an acute skull fracture. 2. There is no acute facial bone fracture. There is a small hematoma over the lateral right frontal region with a small amount of adjacent preseptal edema. No abnormality of the globes or intraconal or extraconal soft tissues are demonstrated. No acute paranasal sinus abnormalities are demonstrated. There are old nasal bone fractures. 3. There is no acute cervical spine fracture nor dislocation. Mild  loss of the normal cervical lordosis likely reflects muscle spasm. There is mild degenerative disc change at C5-6.   Electronically Signed   By: David  [place]   On: 11/12/2013 09:37   Ct Chest W Contrast  11/11/2013   CLINICAL DATA:  Right chest pain in the clavicle area after being kicked by horse 8 days ago. Diffuse abdominal pain for the past 2 days. Hematemesis for the past 2 days. Only able to tolerate drinking 1/2 bottle of contrast.  EXAM: CT CHEST, ABDOMEN, AND PELVIS WITH CONTRAST  TECHNIQUE: Multidetector CT imaging of the chest, abdomen and pelvis was performed following the standard protocol during bolus administration of intravenous contrast.  CONTRAST:  50mL OMNIPAQUE IOHEXOL 300 MG/ML SOLN, 100mL OMNIPAQUE IOHEXOL 300 MG/ML SOLN  COMPARISON:  Chest radiographs obtained yesterday. Chest CT dated 05/02/2013. Abdomen ultrasound dated 03/18/2010.  FINDINGS: CT CHEST FINDINGS  The lungs are  clear. No fracture or pneumothorax. No pleural or mediastinal fluid. T11 limbus vertebra.  CT ABDOMEN AND PELVIS FINDINGS  Marked diffuse low density of the liver relative to the spleen. Normal appearing spleen, pancreas, gallbladder, adrenal glands, kidneys and urinary bladder. 1.1 cm rounded area of fluid density in the posterior aspect of a normal-sized prostate gland. Normal appearing seminal vesicles. No gastrointestinal abnormalities or enlarged lymph nodes. Normal appearing appendix. No lumbar spine fracture or subluxation. Small Schmorl's node in the superior aspect of the L2 vertebral body.  IMPRESSION: 1. No acute injury to the chest, abdomen or pelvis. 2. Marked diffuse hepatic steatosis. 3. 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   Electronically Signed   By: Gordan Payment M.D.   On: 11/11/2013 19:36   Ct Cervical Spine Wo Contrast  11/12/2013   CLINICAL DATA:  Status post fall from a standing position striking the right-sided the patient and hand  without loss of consciousness; bruising over the right face and night today; seizure like activity observed in the emergency department  EXAM: CT HEAD WITHOUT CONTRAST  CT MAXILLOFACIAL WITHOUT CONTRAST  CT CERVICAL SPINE WITHOUT CONTRAST  TECHNIQUE: Multidetector CT imaging of the head, cervical spine, and maxillofacial structures were performed using the standard protocol without intravenous contrast. Multiplanar CT image reconstructions of the cervical spine and maxillofacial structures were also generated.  COMPARISON:  CT scan of the brain dated May 12, 2012.  FINDINGS: CT HEAD FINDINGS  The ventricles are normal in size and position. There is no intracranial hemorrhage nor intracranial mass effect. There is no acute ischemic change. The cerebellum and brainstem are unremarkable.  The observed paranasal sinuses and mastoid air cells are clear. There is no acute skull fracture nor cephalohematoma.  CT MAXILLOFACIAL FINDINGS  There is mild soft tissues swelling over the right lateral face. There is mild preseptal edema on the right. The globes are intact. There are no air-fluid levels within the paranasal sinuses. There is a retention cyst or polyp in the inferior aspect of the right maxillary sinus.  There are deformities of the nasal bone without significant soft tissue swelling. The nasal septum is mildly deviated towards the right without evidence of nasal passage obstruction. The maxilla and mandible are intact. The pterygoid plates and zygomatic arches are intact. The bony orbits are intact. The paranasal sinus walls are intact.  CT CERVICAL SPINE FINDINGS  There is mild reversal of the normal cervical lordosis. The vertebral bodies are preserved in height. There is no perched facet nor facet or spinous process fracture. There is mild degenerative disc space narrowing at C5-6. The prevertebral soft tissue spaces are unremarkable where visualized. The odontoid is intact. A subtle cleft at the right base of  the odontoid is demonstrated but was previously seen on a study of June 30, 2011. The visualized soft tissues of the neck reveal no acute abnormalities.  IMPRESSION: 1. There is no acute intracranial abnormality nor evidence of an acute skull fracture. 2. There is no acute facial bone fracture. There is a small hematoma over the lateral right frontal region with a small amount of adjacent preseptal edema. No abnormality of the globes or intraconal or extraconal soft tissues are demonstrated. No acute paranasal sinus abnormalities are demonstrated. There are old nasal bone fractures. 3. There is no acute cervical spine fracture nor dislocation. Mild loss of the normal cervical lordosis likely reflects muscle spasm. There is mild degenerative disc change at  C5-6.   Electronically Signed   By: David  [place]   On: 11/12/2013 09:37   [place] Abdomen Complete  11/12/2013   CLINICAL DATA:  Abnormal liver function tests. Bowel resection. Prior drug addiction. Fatty liver.  EXAM: ULTRASOUND ABDOMEN COMPLETE  COMPARISON:  CT of 1 day prior  FINDINGS: Gallbladder: No gallstones or wall thickening visualized. No sonographic Murphy sign noted.  Common bile duct: Diameter: Normal, 5 mm.  Liver: Enlarged at 23 cm.  Increased echogenicity.  IVC: No abnormality visualized.  Pancreas: Visualized portion unremarkable.  Spleen: Size and appearance within normal limits.  Right Kidney: Length: 11.6 cm. Echogenicity within normal limits. No mass or hydronephrosis visualized.  Left Kidney: Length: 10.9 cm. Echogenicity within normal limits. No mass or hydronephrosis visualized.  Abdominal aorta: No aneurysm visualized.  Other findings: No ascites.  Exam degraded by overlying bowel gas and patient body habitus.  IMPRESSION: 1. Hepatic steatosis. 2. No other explanation for elevated liver function tests.   Electronically Signed   By: Jeronimo GreavesKyle  Talbot M.D.   On: 11/12/2013 17:31   Ct Abdomen Pelvis W Contrast  11/11/2013   CLINICAL DATA:  Right  chest pain in the clavicle area after being kicked by horse 8 days ago. Diffuse abdominal pain for the past 2 days. Hematemesis for the past 2 days. Only able to tolerate drinking 1/2 bottle of contrast.  EXAM: CT CHEST, ABDOMEN, AND PELVIS WITH CONTRAST  TECHNIQUE: Multidetector CT imaging of the chest, abdomen and pelvis was performed following the standard protocol during bolus administration of intravenous contrast.  CONTRAST:  50mL OMNIPAQUE IOHEXOL 300 MG/ML SOLN, 100mL OMNIPAQUE IOHEXOL 300 MG/ML SOLN  COMPARISON:  Chest radiographs obtained yesterday. Chest CT dated 05/02/2013. Abdomen ultrasound dated 03/18/2010.  FINDINGS: CT CHEST FINDINGS  The lungs are clear. No fracture or pneumothorax. No pleural or mediastinal fluid. T11 limbus vertebra.  CT ABDOMEN AND PELVIS FINDINGS  Marked diffuse low density of the liver relative to the spleen. Normal appearing spleen, pancreas, gallbladder, adrenal glands, kidneys and urinary bladder. 1.1 cm rounded area of fluid density in the posterior aspect of a normal-sized prostate gland. Normal appearing seminal vesicles. No gastrointestinal abnormalities or enlarged lymph nodes. Normal appearing appendix. No lumbar spine fracture or subluxation. Small Schmorl's node in the superior aspect of the L2 vertebral body.  IMPRESSION: 1. No acute injury to the chest, abdomen or pelvis. 2. Marked diffuse hepatic steatosis. 3. 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   Electronically Signed   By: Gordan PaymentSteve  Reid M.D.   On: 11/11/2013 19:36   Ct Maxillofacial Wo Cm  11/12/2013   CLINICAL DATA:  Status post fall from a standing position striking the right-sided the patient and hand without loss of consciousness; bruising over the right face and night today; seizure like activity observed in the emergency department  EXAM: CT HEAD WITHOUT CONTRAST  CT MAXILLOFACIAL WITHOUT CONTRAST  CT CERVICAL SPINE WITHOUT CONTRAST  TECHNIQUE:  Multidetector CT imaging of the head, cervical spine, and maxillofacial structures were performed using the standard protocol without intravenous contrast. Multiplanar CT image reconstructions of the cervical spine and maxillofacial structures were also generated.  COMPARISON:  CT scan of the brain dated May 12, 2012.  FINDINGS: CT HEAD FINDINGS  The ventricles are normal in size and position. There is no intracranial hemorrhage nor intracranial mass effect. There is no acute ischemic change. The cerebellum and brainstem are unremarkable.  The observed paranasal sinuses and  mastoid air cells are clear. There is no acute skull fracture nor cephalohematoma.  CT MAXILLOFACIAL FINDINGS  There is mild soft tissues swelling over the right lateral face. There is mild preseptal edema on the right. The globes are intact. There are no air-fluid levels within the paranasal sinuses. There is a retention cyst or polyp in the inferior aspect of the right maxillary sinus.  There are deformities of the nasal bone without significant soft tissue swelling. The nasal septum is mildly deviated towards the right without evidence of nasal passage obstruction. The maxilla and mandible are intact. The pterygoid plates and zygomatic arches are intact. The bony orbits are intact. The paranasal sinus walls are intact.  CT CERVICAL SPINE FINDINGS  There is mild reversal of the normal cervical lordosis. The vertebral bodies are preserved in height. There is no perched facet nor facet or spinous process fracture. There is mild degenerative disc space narrowing at C5-6. The prevertebral soft tissue spaces are unremarkable where visualized. The odontoid is intact. A subtle cleft at the right base of the odontoid is demonstrated but was previously seen on a study of June 30, 2011. The visualized soft tissues of the neck reveal no acute abnormalities.  IMPRESSION: 1. There is no acute intracranial abnormality nor evidence of an acute skull fracture.  2. There is no acute facial bone fracture. There is a small hematoma over the lateral right frontal region with a small amount of adjacent preseptal edema. No abnormality of the globes or intraconal or extraconal soft tissues are demonstrated. No acute paranasal sinus abnormalities are demonstrated. There are old nasal bone fractures. 3. There is no acute cervical spine fracture nor dislocation. Mild loss of the normal cervical lordosis likely reflects muscle spasm. There is mild degenerative disc change at C5-6.   Electronically Signed   By: David  [place]   On: 11/12/2013 09:37    Lab Results: Basic Metabolic Panel: No results for input(s): NA, K, CL, CO2, GLUCOSE, BUN, CREATININE, CALCIUM, MG, PHOS in the last 72 hours. Liver Function Tests: No results for input(s): AST, ALT, ALKPHOS, BILITOT, PROT, ALBUMIN in the last 72 hours.   CBC: No results for input(s): WBC, NEUTROABS, HGB, HCT, MCV, PLT in the last 72 hours.  Recent Results (from the past 240 hour(s))  MRSA PCR Screening     Status: None   Collection Time: 11/12/13  9:12 AM  Result Value Ref Range Status   MRSA by PCR NEGATIVE NEGATIVE Final    Comment:        The GeneXpert MRSA Assay (FDA approved for NASAL specimens only), is one component of a comprehensive MRSA colonization surveillance program. It is not intended to diagnose MRSA infection nor to guide or monitor treatment for MRSA infections.      Hospital Course: this is a 33 year old with a long history of bipolar disease versus posttraumatic stress disorder versus both who also has a long known history of drug and alcohol abuse. He came to the emergency department with a dental abscess and had a seizure. He has previous history of seizure disorder and has been on Dilantin. He had a brief episode of atrial fibrillation with rapid ventricular response following the seizure but reverted to sinus rhythm. His liver enzymes were up and he is known to have fatty liver but  he also had a markedly elevated alcohol level and was felt to have alcoholic hepatitis. He improved over the next 48 hours to the point he was ready for discharge  but he will need close follow-up of his liver enzymes. He was switched from Dilantin to Keppra for seizures. He will follow with his pain management and Dr. Ronal Fear office.  Discharge Exam: Blood pressure 145/100, pulse 70, temperature 98.7 F (37.1 C), temperature source Oral, resp. rate 20, height 5\' 4"  (1.626 m), weight 96.2 kg (212 lb 1.3 oz), SpO2 98 %. He is awake and alert. He is anxious. His chest is clear  Disposition: home        Follow-up Information    Follow up with Beryle Beams, MD On 11/27/2013.   Specialty:  Neurology   Why:  at 3:30 pm   Contact information:   [address] [place] [address] [phone]       Follow up with Fredirick Maudlin, MD On 11/26/2013.   Specialty:  Pulmonary Disease   Why:  at 2:00 pm   Contact information:   [address] [place] [address] [phone]       Signed: Tenessa Marsee L   11/17/2013, 9:00 AM

## 2014-03-02 ENCOUNTER — Emergency Department (HOSPITAL_COMMUNITY)
Admission: EM | Admit: 2014-03-02 | Discharge: 2014-03-03 | Disposition: A | Discharge status: Home / Self Care | Payer: Medicare Other | Attending: Emergency Medicine | Admitting: Emergency Medicine

## 2014-03-02 ENCOUNTER — Encounter (HOSPITAL_COMMUNITY): Payer: Self-pay

## 2014-03-02 DIAGNOSIS — G43909 Migraine, unspecified, not intractable, without status migrainosus: Secondary | ICD-10-CM | POA: Diagnosis not present

## 2014-03-02 DIAGNOSIS — Z8719 Personal history of other diseases of the digestive system: Secondary | ICD-10-CM | POA: Diagnosis not present

## 2014-03-02 DIAGNOSIS — F1010 Alcohol abuse, uncomplicated: Secondary | ICD-10-CM | POA: Insufficient documentation

## 2014-03-02 DIAGNOSIS — Z79899 Other long term (current) drug therapy: Secondary | ICD-10-CM | POA: Diagnosis not present

## 2014-03-02 DIAGNOSIS — F319 Bipolar disorder, unspecified: Secondary | ICD-10-CM | POA: Diagnosis not present

## 2014-03-02 DIAGNOSIS — Z87828 Personal history of other (healed) physical injury and trauma: Secondary | ICD-10-CM | POA: Insufficient documentation

## 2014-03-02 DIAGNOSIS — Z720 Tobacco use: Secondary | ICD-10-CM | POA: Diagnosis not present

## 2014-03-02 DIAGNOSIS — Z792 Long term (current) use of antibiotics: Secondary | ICD-10-CM | POA: Diagnosis not present

## 2014-03-02 DIAGNOSIS — M1990 Unspecified osteoarthritis, unspecified site: Secondary | ICD-10-CM | POA: Diagnosis not present

## 2014-03-02 HISTORY — DX: Major depressive disorder, single episode, unspecified: F32.9

## 2014-03-02 HISTORY — DX: Depression, unspecified: F32.A

## 2014-03-02 LAB — CBC WITH DIFFERENTIAL/PLATELET
Basophils Absolute: 0.1 10*3/uL (ref 0.0–0.1)
Basophils Relative: 2 % — ABNORMAL HIGH (ref 0–1)
Eosinophils Absolute: 0.1 10*3/uL (ref 0.0–0.7)
Eosinophils Relative: 1 % (ref 0–5)
HCT: 46.6 % (ref 39.0–52.0)
Hemoglobin: 16.1 g/dL (ref 13.0–17.0)
Lymphocytes Relative: 35 % (ref 12–46)
Lymphs Abs: 3.1 10*3/uL (ref 0.7–4.0)
MCH: 31.9 pg (ref 26.0–34.0)
MCHC: 34.5 g/dL (ref 30.0–36.0)
MCV: 92.5 fL (ref 78.0–100.0)
Monocytes Absolute: 0.9 10*3/uL (ref 0.1–1.0)
Monocytes Relative: 11 % (ref 3–12)
Neutro Abs: 4.6 10*3/uL (ref 1.7–7.7)
Neutrophils Relative %: 51 % (ref 43–77)
Platelets: 281 10*3/uL (ref 150–400)
RBC: 5.04 MIL/uL (ref 4.22–5.81)
RDW: 13.1 % (ref 11.5–15.5)
WBC: 8.8 10*3/uL (ref 4.0–10.5)

## 2014-03-02 LAB — COMPREHENSIVE METABOLIC PANEL
ALT: 88 U/L — ABNORMAL HIGH (ref 0–53)
AST: 73 U/L — ABNORMAL HIGH (ref 0–37)
Albumin: 4.4 g/dL (ref 3.5–5.2)
Alkaline Phosphatase: 89 U/L (ref 39–117)
Anion gap: 10 (ref 5–15)
BUN: 9 mg/dL (ref 6–23)
CO2: 25 mmol/L (ref 19–32)
Calcium: 9.2 mg/dL (ref 8.4–10.5)
Chloride: 105 mmol/L (ref 96–112)
Creatinine, Ser: 0.67 mg/dL (ref 0.50–1.35)
GFR calc Af Amer: >90 mL/min (ref >90)
GFR calc non Af Amer: >90 mL/min (ref >90)
Glucose, Bld: 111 mg/dL — ABNORMAL HIGH (ref 70–99)
Potassium: 3.9 mmol/L (ref 3.5–5.1)
Sodium: 140 mmol/L (ref 135–145)
Total Bilirubin: 0.4 mg/dL (ref 0.3–1.2)
Total Protein: 7.9 g/dL (ref 6.0–8.3)

## 2014-03-02 LAB — RAPID URINE DRUG SCREEN, HOSP PERFORMED
Amphetamines: NOT DETECTED
Barbiturates: NOT DETECTED
Benzodiazepines: NOT DETECTED
Cocaine: NOT DETECTED
Opiates: NOT DETECTED
Tetrahydrocannabinol: NOT DETECTED

## 2014-03-02 LAB — ETHANOL: Alcohol, Ethyl (B): 282 mg/dL — ABNORMAL HIGH (ref 0–9)

## 2014-03-02 MED ORDER — LEVETIRACETAM 500 MG PO TABS
500.0000 mg | ORAL_TABLET | Freq: Two times a day (BID) | ORAL | Status: DC
  Administered 2014-03-02 – 2014-03-03 (×2): 500 mg via ORAL
  Filled 2014-03-02 (×2): qty 1

## 2014-03-02 MED ORDER — DULOXETINE HCL 30 MG PO CPEP
60.0000 mg | ORAL_CAPSULE | Freq: Every day | ORAL | Status: DC
  Administered 2014-03-03: 60 mg via ORAL
  Filled 2014-03-02: qty 2

## 2014-03-02 MED ORDER — GABAPENTIN 400 MG PO CAPS
400.0000 mg | ORAL_CAPSULE | Freq: Three times a day (TID) | ORAL | Status: DC
  Administered 2014-03-02 – 2014-03-03 (×2): 400 mg via ORAL
  Filled 2014-03-02 (×2): qty 1

## 2014-03-02 MED ORDER — GI COCKTAIL ~~LOC~~
30.0000 mL | Freq: Once | ORAL | Status: AC
  Administered 2014-03-03: 30 mL via ORAL
  Filled 2014-03-02: qty 30

## 2014-03-02 MED ORDER — NICOTINE 21 MG/24HR TD PT24
21.0000 mg | MEDICATED_PATCH | Freq: Every day | TRANSDERMAL | Status: DC
  Administered 2014-03-02 – 2014-03-03 (×2): 21 mg via TRANSDERMAL
  Filled 2014-03-02 (×2): qty 1

## 2014-03-02 MED ORDER — CLONAZEPAM 0.5 MG PO TABS
1.0000 mg | ORAL_TABLET | Freq: Four times a day (QID) | ORAL | Status: DC
  Administered 2014-03-02 – 2014-03-03 (×3): 1 mg via ORAL
  Filled 2014-03-02 (×4): qty 2

## 2014-03-02 MED ORDER — EPINEPHRINE 0.3 MG/0.3ML IJ SOAJ: 0.3000 mg | Freq: Once | INTRAMUSCULAR | Status: DC

## 2014-03-02 MED ORDER — METOPROLOL SUCCINATE ER 50 MG PO TB24
100.0000 mg | ORAL_TABLET | Freq: Every day | ORAL | Status: DC
  Administered 2014-03-03: 100 mg via ORAL
  Filled 2014-03-02 (×2): qty 1
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
## 2014-03-03 DIAGNOSIS — F1010 Alcohol abuse, uncomplicated: Secondary | ICD-10-CM | POA: Diagnosis not present

## 2014-03-03 MED ORDER — ALUM & MAG HYDROXIDE-SIMETH 200-200-20 MG/5ML PO SUSP: 30.0000 mL | ORAL | Status: DC | PRN

## 2014-03-03 MED ORDER — ONDANSETRON HCL 4 MG PO TABS: 4.0000 mg | ORAL_TABLET | Freq: Three times a day (TID) | ORAL | Status: DC | PRN

## 2014-03-03 MED ORDER — ONDANSETRON 8 MG PO TBDP
8.0000 mg | ORAL_TABLET | Freq: Three times a day (TID) | ORAL | Status: DC | PRN
  Administered 2014-03-03: 8 mg via ORAL
  Filled 2014-03-03: qty 1

## 2014-03-03 MED ORDER — PANTOPRAZOLE SODIUM 40 MG PO TBEC
40.0000 mg | DELAYED_RELEASE_TABLET | Freq: Every day | ORAL | Status: DC
  Administered 2014-03-03 (×2): 40 mg via ORAL
  Filled 2014-03-03 (×2): qty 1

## 2014-03-03 NOTE — ED Notes (Signed)
Pt undergoing TTS.  

## 2014-03-03 NOTE — ED Notes (Signed)
Pt up to restroom.

## 2014-03-03 NOTE — Discharge Instructions (Signed)
°Emergency Department Resource Guide °1) Find a Doctor and Pay Out of Pocket °Although you won't have to find out who is covered by your insurance plan, it is a good idea to ask around and get recommendations. You will then need to call the office and see if the doctor you have chosen will accept you as a new patient and what types of options they offer for patients who are self-pay. Some doctors offer discounts or will set up payment plans for their patients who do not have insurance, but you will need to ask so you aren't surprised when you get to your appointment. ° °2) Contact Your Local Health Department °Not all health departments have doctors that can see patients for sick visits, but many do, so it is worth a call to see if yours does. If you don't know where your local health department is, you can check in your phone book. The CDC also has a tool to help you locate your state's health department, and many state websites also have listings of all of their local health departments. ° °3) Find a Walk-in Clinic °If your illness is not likely to be very severe or complicated, you may want to try a walk in clinic. These are popping up all over the country in pharmacies, drugstores, and shopping centers. They're usually staffed by nurse practitioners or physician assistants that have been trained to treat common illnesses and complaints. They're usually fairly quick and inexpensive. However, if you have serious medical issues or chronic medical problems, these are probably not your best option. ° °No Primary Care Doctor: °- Call [facility] at  832-8000 - they can help you locate a primary care doctor that  accepts your insurance, provides certain services, etc. °- Physician Referral Service- 1-800-533-3463 ° °Chronic Pain Problems: °Organization         Address  Phone   Notes  °[facility]  (336) 297-2271 Patients need to be referred by their primary care doctor.  ° °Medication  Assistance: °Organization         Address  Phone   Notes  °[place] [facility] [address] °[place], [place] [address] (336) 641-8030 --Must be a resident of [place] °-- Must have NO insurance coverage whatsoever (no Medicaid/ Medicare, etc.) °-- The pt. MUST have a primary care doctor that directs their care regularly and follows them in the community °  °[facility]  (866) 331-1348   °[employer]  (888) 892-1162   ° °Agencies that provide inexpensive medical care: °Organization         Address  Phone   Notes  °[facility]  (336) 832-8035   ° Internal Medicine    (336) 832-7272   °[facility] [address] °[place],  [address] (336) 832-4777   °[facility] [address], °[place] (336) 271-4999   °[facility]    (336) 373-0678   °[facility]    (336) 272-1050   °[facility] ° [address], [place] Phone:  (336) 832-4444, Fax:  (336) 832-4440 Hours of Operation:  9 am - 6 pm, M-F.  Also accepts Medicaid/Medicare and self-pay.  °[facility] ° [address],  Phone: (336) 832-3150, Fax: (336) 832-3151. Hours of Operation:  8:30 am - 5:30 pm, M-F.  Also accepts Medicaid and self-pay.  °[facility] [place] [address], [place] Phone: (336) 878-6027   °[facility] [address], [place], [place] (336)723-1848, Ext. 123 Mondays & Thursdays: 7-9 AM.  First 15 patients are seen on a first come, first serve basis. °  ° °Medicaid-accepting [place] Providers: ° °Organization         Address  Phone   Notes  °[facility] [address], [place] (336) 641-2100 Also accepts self-pay patients.  °[facility] [address], [place] ° (336) 856-9996   °[facility] [address], [place]  (336) 288-8857   °[facility] [address] [place] [address], [place] (336) 299-7000   °Veita Bland [address], [place]  ° (336) 373-1557 Only accepts [place] Access Medicaid patients after they have their name applied to their card.  ° °Self-Pay (no insurance) in [place]: ° °Organization         Address  Phone   Notes  °Sickle Cell Patients, [facility] [address], [place] (336) 832-1970   °[facility] [address], [place] (336) 832-4400   °[facility] [place] ° [address], [place] (336) 992-4800   °[facility]/Dr. Osei-Bonsu ° [address] [place] [address], [place] or [address], [place] (336) 841-8500 Phone number for both [place] and [place] locations is the same.  °[facility] [address], [place] (336) 299-0000   °[facility] [address] [place] [address], [place] or [address] (336) 852-7530 °(336) 878-2260   °[facility] [address], [place] (336) 350-1642, phone; (336) 294-5005, fax Sees patients 1st and 3rd Saturday of every month.  Must not qualify for public or private insurance (i.e. Medicaid, Medicare, [place] Health Choice, Veterans' Benefits) • Household income should be no more than 200% of the poverty level •The clinic cannot treat you if you are pregnant or think you are pregnant • Sexually transmitted diseases are not treated at the clinic.  ° ° °Dental Care: °Organization         Address  Phone  Notes  °[place] [facility] [address], [place] (336) 641-6152 Accepts children up to age 21 who are enrolled in Medicaid or [place] Health Choice; pregnant women with a Medicaid card; and children who have applied for Medicaid or [place] Health Choice, but were declined, whose parents can pay a reduced fee at time of service.  °[place]  [facility] [place]  [address], [place] (336) 641-7733 Accepts children up to age 21 who are enrolled in Medicaid or [place] Health Choice; pregnant women with a Medicaid card; and children who have applied for Medicaid or [place] Health Choice, but were declined, whose parents can pay a reduced fee at time of service.  °[facility] ° [address],  (336) 641-4533 Patients are seen by appointment only. Walk-ins are not accepted. [facility] will see patients 18 years of age and older. °Monday - Tuesday (8am-5pm) °Most Wednesdays (8:30-5pm) °$30 per visit, cash only  °[facility] ° [address], [place] (336) 641-4533 Patients are seen by appointment only. Walk-ins are not accepted. [facility] will see patients 18 years of age and older. °One   Wednesday Evening (Monthly: Volunteer Based).  $30 per visit, cash only  °[facility]  (919) 537-3737 for adults; Children under age 4, call Graduate Pediatric Dentistry at (919) [phone]. Children aged 4-14, please call (919) [phone] to request a pediatric application. ° Dental services are provided in all areas of dental care including fillings, crowns and bridges, complete and partial dentures, implants, gum treatment, root canals, and extractions. Preventive care is also provided. Treatment is provided to both adults and children. °Patients are selected via a lottery and there is often a waiting list. °  °[facility] [address], °[place] ° (336) 763-8833 [email or web address] °  °[facility] [address], [place], [place] (336)723-1848, Ext. 123 Second and Fourth Thursday of each month, opens at 6:30 AM; Clinic ends at 9 AM.  Patients are seen on a first-come first-served basis, and a limited number are seen during each clinic.  ° °[facility] ° [address], [place], [place] (336) 723-7904    Eligibility Requirements °You must have lived in [place], [place], or [place] counties for at least the last three months. °  You cannot be eligible for state or federal sponsored healthcare insurance, including Veterans Administration, Medicaid, or Medicare. °  You generally cannot be eligible for healthcare insurance through your employer.  °  How to apply: °Eligibility screenings are held every Tuesday and Wednesday afternoon from 1:00 pm until 4:00 pm. You do not need an appointment for the interview!  °[facility] [address], [place], [place] 336-631-2330   °[facility]  336-342-8273   °[facility]  336-703-3100   °[facility]  336-570-6415   ° °Behavioral Health Resources in the Community: °Intensive Outpatient Programs °Organization         Address  Phone  Notes  °[facility] [address], [place], [place] 336-878-6098   °[facility] [address], [place], [place] 336-832-9800   °[facility] [address], [place], [place] ° 336-882-2125   °[facility] [address],  °[place], [place] 1-800-853-5163 or 336-641-4981   °Substance Abuse Resources °Organization         Address  Phone  Notes  °[facility]  336-882-2125   °[facility]  336-784-9470   °[facility]  336-285-9073   °[facility]  336-845-3988   °Residential & Outpatient Substance Abuse Program  1-800-659-3381   °Psychological Services °Organization         Address  Phone  Notes  °[facility]  336- 832-9600   °[facility]  336- 378-7881   °[facility] [address], [place] 1-800-853-5163 or 336-641-4981   ° °Mobile Crisis Teams °Organization         Address  Phone  Notes  °[facility], Mobile Crisis Care Unit  1-877-626-1772   °Assertive °[facility] ° [address]  [place],  336-834-9664   °Sharon DeEsch [address] °[place]  336-554-5454   ° °Self-Help/Support Groups °Organization         Address  Phone             Notes  °[facility]  - variety of support groups  336- 373-1402 Call for more information  °Narcotics Anonymous (NA), [facility] [address], °[place]   2 meetings at this location  ° °  Residential Treatment Programs °Organization         Address  Phone  Notes  °[facility] [address],    °[place]  1-866-801-8205   °[facility] ° [address], [place], [place] 704-293-8524   °[facility] [address], [place] 336-845-3988 Admissions: 8am-3pm M-F  °[facility] [address]    °[place], [place] 336-841-1104   °[facility] [address], [place], [place] 336-379-7146   °[facility] [address]  °[place], [place] 336-285-9073   °[facility] Intensive Outpatient [address], [place], [place] 336-852-3033   °[facility] [address]  °[place], [place] 1-877-615-2722 or 336-784-9470   °[facility] [address] [place], [place] 336-227-7417 Accepts Medicaid  °[facility] [address]  °[place] 1-800-659-3381 Substance Abuse/Addiction Treatment  ° °[place] Behavioral Health Resources °Organization         Address  Phone  Notes  °[facility]  (888) 581-9988   °Julie Brannon, PhD [address] [place], [place]   (336) 349-5553 or (336) 951-0000   °[facility]   [address] °[place], [place] (336) 349-4454   °[facility] [address], [place], [place] (336) 342-8316 Insurance/Medicaid/sponsorship through [facility]  °[facility] [address]                                    [place], [place] (336) 342-8316 Therapy/tele-psych/case    °[facility] [address]  ° [place], [place] (336) 349-2233    °Dr. Arfeen  (336) 349-4544   °[facility] [place]  [employer] [place] [facility] 1) [address],  °2) [address], [place] °3)  [address], [place] (336) 349-3220 °(336) 342-7768 ° °(336) 342-8140   °[place] [facility] (336) 342-1394 or (336) 342-3537 (After Hours)    ° ° °

## 2014-03-03 NOTE — ED Provider Notes (Signed)
D/w TTS - they recommend d/c with resource list.    Dean RollerBrian D Oneill Bais, MD 03/03/14 1058

## 2014-03-03 NOTE — ED Notes (Signed)
Mother came out stating pt is having heartburn and is nauseated

## 2014-03-03 NOTE — ED Notes (Signed)
In to discharge pt. Pt request zofran for nausea. [name] aware and wrote Rx. Pt waiting for his mom to come back to bring him home

## 2014-03-03 NOTE — ED Notes (Signed)
Dean [name] [phone]

## 2014-03-03 NOTE — ED Notes (Addendum)
[facility] states to do a CIWA score in the morning and vital signs and may be re-telepsyched in the morning.

## 2014-03-03 NOTE — ED Notes (Signed)
Mother out to desk stating that he didn't take his BP meds 03/02/14
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
## 2014-03-11 DIAGNOSIS — M542 Cervicalgia: Secondary | ICD-10-CM | POA: Diagnosis not present

## 2014-03-11 DIAGNOSIS — F4312 Post-traumatic stress disorder, chronic: Secondary | ICD-10-CM | POA: Diagnosis not present

## 2014-03-11 DIAGNOSIS — R609 Edema, unspecified: Secondary | ICD-10-CM | POA: Diagnosis not present

## 2014-03-11 DIAGNOSIS — R079 Chest pain, unspecified: Secondary | ICD-10-CM | POA: Diagnosis not present

## 2014-03-14 ENCOUNTER — Other Ambulatory Visit: Payer: Self-pay | Admitting: Pulmonary Disease

## 2014-03-14 DIAGNOSIS — M542 Cervicalgia: Secondary | ICD-10-CM

## 2014-03-18 ENCOUNTER — Ambulatory Visit (HOSPITAL_COMMUNITY)
Admission: RE | Admit: 2014-03-18 | Discharge: 2014-03-18 | Disposition: A | Discharge status: Home / Self Care | Payer: Medicare Other | Source: Ambulatory Visit | Attending: Pulmonary Disease | Admitting: Pulmonary Disease

## 2014-03-18 DIAGNOSIS — I071 Rheumatic tricuspid insufficiency: Secondary | ICD-10-CM | POA: Diagnosis not present

## 2014-03-18 DIAGNOSIS — I4891 Unspecified atrial fibrillation: Secondary | ICD-10-CM | POA: Diagnosis not present

## 2014-03-18 NOTE — Progress Notes (Signed)
  Echocardiogram 2D Echocardiogram has been performed.  Stacey DrainWhite, Sheika Coutts J 03/18/2014, 5:01 PM

## 2014-03-20 ENCOUNTER — Ambulatory Visit
Admission: RE | Admit: 2014-03-20 | Discharge: 2014-03-20 | Disposition: A | Discharge status: Home / Self Care | Payer: Medicare Other | Source: Ambulatory Visit | Attending: Pulmonary Disease | Admitting: Pulmonary Disease

## 2014-03-20 DIAGNOSIS — M542 Cervicalgia: Secondary | ICD-10-CM

## 2014-03-20 MED ORDER — TRIAMCINOLONE ACETONIDE 40 MG/ML IJ SUSP (RADIOLOGY)
60.0000 mg | Freq: Once | INTRAMUSCULAR | Status: AC
  Administered 2014-03-20: 60 mg via EPIDURAL

## 2014-03-20 MED ORDER — IOHEXOL 300 MG/ML  SOLN
1.0000 mL | Freq: Once | INTRAMUSCULAR | Status: AC | PRN
  Administered 2014-03-20: 1 mL via EPIDURAL
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
## 2014-03-20 NOTE — Discharge Instructions (Signed)

## 2014-04-05 ENCOUNTER — Encounter (HOSPITAL_COMMUNITY): Payer: Self-pay | Admitting: Emergency Medicine

## 2014-04-05 ENCOUNTER — Emergency Department (HOSPITAL_COMMUNITY)
Admission: EM | Admit: 2014-04-05 | Discharge: 2014-04-06 | Disposition: A | Discharge status: Home / Self Care | Payer: Medicare Other | Attending: Emergency Medicine | Admitting: Emergency Medicine

## 2014-04-05 DIAGNOSIS — Z8781 Personal history of (healed) traumatic fracture: Secondary | ICD-10-CM | POA: Diagnosis not present

## 2014-04-05 DIAGNOSIS — G40909 Epilepsy, unspecified, not intractable, without status epilepticus: Secondary | ICD-10-CM | POA: Insufficient documentation

## 2014-04-05 DIAGNOSIS — F319 Bipolar disorder, unspecified: Secondary | ICD-10-CM | POA: Diagnosis not present

## 2014-04-05 DIAGNOSIS — F1320 Sedative, hypnotic or anxiolytic dependence, uncomplicated: Secondary | ICD-10-CM | POA: Insufficient documentation

## 2014-04-05 DIAGNOSIS — F1920 Other psychoactive substance dependence, uncomplicated: Secondary | ICD-10-CM

## 2014-04-05 DIAGNOSIS — F1010 Alcohol abuse, uncomplicated: Secondary | ICD-10-CM

## 2014-04-05 DIAGNOSIS — Z8719 Personal history of other diseases of the digestive system: Secondary | ICD-10-CM | POA: Insufficient documentation

## 2014-04-05 DIAGNOSIS — R05 Cough: Secondary | ICD-10-CM | POA: Diagnosis not present

## 2014-04-05 DIAGNOSIS — Y906 Blood alcohol level of 120-199 mg/100 ml: Secondary | ICD-10-CM | POA: Insufficient documentation

## 2014-04-05 DIAGNOSIS — K760 Fatty (change of) liver, not elsewhere classified: Secondary | ICD-10-CM | POA: Insufficient documentation

## 2014-04-05 DIAGNOSIS — F10230 Alcohol dependence with withdrawal, uncomplicated: Secondary | ICD-10-CM | POA: Diagnosis not present

## 2014-04-05 DIAGNOSIS — Z720 Tobacco use: Secondary | ICD-10-CM | POA: Insufficient documentation

## 2014-04-05 DIAGNOSIS — Z79899 Other long term (current) drug therapy: Secondary | ICD-10-CM | POA: Diagnosis not present

## 2014-04-05 DIAGNOSIS — F11220 Opioid dependence with intoxication, uncomplicated: Secondary | ICD-10-CM

## 2014-04-05 DIAGNOSIS — Z8739 Personal history of other diseases of the musculoskeletal system and connective tissue: Secondary | ICD-10-CM | POA: Diagnosis not present

## 2014-04-05 DIAGNOSIS — F1120 Opioid dependence, uncomplicated: Secondary | ICD-10-CM | POA: Insufficient documentation

## 2014-04-05 LAB — ETHANOL: Alcohol, Ethyl (B): 139 mg/dL — ABNORMAL HIGH (ref 0–9)

## 2014-04-05 LAB — COMPREHENSIVE METABOLIC PANEL
ALT: 105 U/L — ABNORMAL HIGH (ref 0–53)
AST: 81 U/L — ABNORMAL HIGH (ref 0–37)
Albumin: 4.1 g/dL (ref 3.5–5.2)
Alkaline Phosphatase: 77 U/L (ref 39–117)
Anion gap: 13 (ref 5–15)
BUN: 12 mg/dL (ref 6–23)
CO2: 25 mmol/L (ref 19–32)
Calcium: 8.6 mg/dL (ref 8.4–10.5)
Chloride: 98 mmol/L (ref 96–112)
Creatinine, Ser: 0.65 mg/dL (ref 0.50–1.35)
GFR calc Af Amer: >90 mL/min (ref >90)
GFR calc non Af Amer: >90 mL/min (ref >90)
Glucose, Bld: 94 mg/dL (ref 70–99)
Potassium: 4 mmol/L (ref 3.5–5.1)
Sodium: 136 mmol/L (ref 135–145)
Total Bilirubin: 1 mg/dL (ref 0.3–1.2)
Total Protein: 7.5 g/dL (ref 6.0–8.3)

## 2014-04-05 LAB — RAPID URINE DRUG SCREEN, HOSP PERFORMED
Amphetamines: NOT DETECTED
Barbiturates: NOT DETECTED
Benzodiazepines: POSITIVE — AB
Cocaine: NOT DETECTED
Opiates: NOT DETECTED
Tetrahydrocannabinol: NOT DETECTED

## 2014-04-05 LAB — CBC WITH DIFFERENTIAL/PLATELET
Basophils Absolute: 0.1 10*3/uL (ref 0.0–0.1)
Basophils Relative: 1 % (ref 0–1)
Eosinophils Absolute: 0.1 10*3/uL (ref 0.0–0.7)
Eosinophils Relative: 1 % (ref 0–5)
HCT: 38.6 % — ABNORMAL LOW (ref 39.0–52.0)
Hemoglobin: 13.2 g/dL (ref 13.0–17.0)
Lymphocytes Relative: 20 % (ref 12–46)
Lymphs Abs: 1.7 10*3/uL (ref 0.7–4.0)
MCH: 31.4 pg (ref 26.0–34.0)
MCHC: 34.2 g/dL (ref 30.0–36.0)
MCV: 91.7 fL (ref 78.0–100.0)
Monocytes Absolute: 1 10*3/uL (ref 0.1–1.0)
Monocytes Relative: 12 % (ref 3–12)
Neutro Abs: 5.6 10*3/uL (ref 1.7–7.7)
Neutrophils Relative %: 66 % (ref 43–77)
Platelets: 268 10*3/uL (ref 150–400)
RBC: 4.21 MIL/uL — ABNORMAL LOW (ref 4.22–5.81)
RDW: 14.3 % (ref 11.5–15.5)
WBC: 8.3 10*3/uL (ref 4.0–10.5)

## 2014-04-05 LAB — HEMOGLOBIN AND HEMATOCRIT, BLOOD
HCT: 40.7 % (ref 39.0–52.0)
Hemoglobin: 13.5 g/dL (ref 13.0–17.0)

## 2014-04-05 LAB — PROTIME-INR
INR: 0.88 (ref 0.00–1.49)
Prothrombin Time: 12 seconds (ref 11.6–15.2)

## 2014-04-05 LAB — POC OCCULT BLOOD, ED: Fecal Occult Bld: NEGATIVE

## 2014-04-05 LAB — LIPASE, BLOOD: Lipase: 32 U/L (ref 11–59)

## 2014-04-05 LAB — I-STAT CG4 LACTIC ACID, ED: Lactic Acid, Venous: 2.26 mmol/L (ref 0.5–2.0)

## 2014-04-05 MED ORDER — NAPROXEN 500 MG PO TABS
500.0000 mg | ORAL_TABLET | Freq: Two times a day (BID) | ORAL | Status: DC | PRN
Start: 2014-04-05 — End: 2014-04-06

## 2014-04-05 MED ORDER — DICYCLOMINE HCL 20 MG PO TABS: 20.0000 mg | ORAL_TABLET | Freq: Four times a day (QID) | ORAL | Status: DC | PRN

## 2014-04-05 MED ORDER — DULOXETINE HCL 60 MG PO CPEP
60.0000 mg | ORAL_CAPSULE | Freq: Every day | ORAL | Status: DC
  Administered 2014-04-05 – 2014-04-06 (×2): 60 mg via ORAL
  Filled 2014-04-05 (×2): qty 1

## 2014-04-05 MED ORDER — GABAPENTIN 300 MG PO CAPS
300.0000 mg | ORAL_CAPSULE | Freq: Three times a day (TID) | ORAL | Status: DC
  Administered 2014-04-05 – 2014-04-06 (×3): 300 mg via ORAL
  Filled 2014-04-05 (×3): qty 1

## 2014-04-05 MED ORDER — CLONIDINE HCL 0.1 MG PO TABS: 0.1000 mg | ORAL_TABLET | ORAL | Status: DC

## 2014-04-05 MED ORDER — GABAPENTIN 400 MG PO CAPS
400.0000 mg | ORAL_CAPSULE | Freq: Three times a day (TID) | ORAL | Status: DC
  Administered 2014-04-05: 400 mg via ORAL
  Filled 2014-04-05: qty 1

## 2014-04-05 MED ORDER — ONDANSETRON 4 MG PO TBDP: 4.0000 mg | ORAL_TABLET | Freq: Four times a day (QID) | ORAL | Status: DC | PRN

## 2014-04-05 MED ORDER — SODIUM CHLORIDE 0.9 % IV BOLUS (SEPSIS)
1000.0000 mL | Freq: Once | INTRAVENOUS | Status: AC
  Administered 2014-04-05: 1000 mL via INTRAVENOUS

## 2014-04-05 MED ORDER — LORAZEPAM 1 MG PO TABS
0.0000 mg | ORAL_TABLET | Freq: Four times a day (QID) | ORAL | Status: DC
  Administered 2014-04-05: 2 mg via ORAL
  Filled 2014-04-05: qty 2

## 2014-04-05 MED ORDER — HYDROXYZINE HCL 25 MG PO TABS: 25.0000 mg | ORAL_TABLET | Freq: Four times a day (QID) | ORAL | Status: DC | PRN

## 2014-04-05 MED ORDER — CLONIDINE HCL 0.1 MG PO TABS: 0.1000 mg | ORAL_TABLET | Freq: Every day | ORAL | Status: DC

## 2014-04-05 MED ORDER — VITAMIN B-1 100 MG PO TABS
100.0000 mg | ORAL_TABLET | Freq: Every day | ORAL | Status: DC
  Administered 2014-04-05: 100 mg via ORAL
  Filled 2014-04-05: qty 1

## 2014-04-05 MED ORDER — PANTOPRAZOLE SODIUM 40 MG IV SOLR
40.0000 mg | INTRAVENOUS | Status: AC
  Administered 2014-04-05: 40 mg via INTRAVENOUS
  Filled 2014-04-05: qty 40

## 2014-04-05 MED ORDER — LEVETIRACETAM 500 MG PO TABS
500.0000 mg | ORAL_TABLET | Freq: Two times a day (BID) | ORAL | Status: DC
  Administered 2014-04-05 – 2014-04-06 (×3): 500 mg via ORAL
  Filled 2014-04-05 (×5): qty 1

## 2014-04-05 MED ORDER — PROMETHAZINE HCL 25 MG/ML IJ SOLN
25.0000 mg | Freq: Once | INTRAMUSCULAR | Status: AC
  Administered 2014-04-05: 25 mg via INTRAVENOUS
  Filled 2014-04-05 (×2): qty 1

## 2014-04-05 MED ORDER — METHOCARBAMOL 500 MG PO TABS: 500.0000 mg | ORAL_TABLET | Freq: Three times a day (TID) | ORAL | Status: DC | PRN

## 2014-04-05 MED ORDER — CHLORDIAZEPOXIDE HCL 25 MG PO CAPS
50.0000 mg | ORAL_CAPSULE | Freq: Once | ORAL | Status: AC
  Administered 2014-04-05: 50 mg via ORAL
  Filled 2014-04-05 (×2): qty 2

## 2014-04-05 MED ORDER — LOPERAMIDE HCL 2 MG PO CAPS: 2.0000 mg | ORAL_CAPSULE | ORAL | Status: DC | PRN

## 2014-04-05 MED ORDER — CLONAZEPAM 0.5 MG PO TABS
1.0000 mg | ORAL_TABLET | Freq: Four times a day (QID) | ORAL | Status: DC
  Administered 2014-04-05: 1 mg via ORAL
  Filled 2014-04-05: qty 2

## 2014-04-05 MED ORDER — ONDANSETRON 4 MG PO TBDP
4.0000 mg | ORAL_TABLET | Freq: Once | ORAL | Status: AC
  Administered 2014-04-05: 4 mg via ORAL
  Filled 2014-04-05: qty 1

## 2014-04-05 MED ORDER — LORAZEPAM 2 MG/ML IJ SOLN
0.0000 mg | Freq: Four times a day (QID) | INTRAMUSCULAR | Status: DC
  Administered 2014-04-05 (×2): 2 mg via INTRAVENOUS
  Filled 2014-04-05 (×2): qty 1

## 2014-04-05 MED ORDER — LORAZEPAM 1 MG PO TABS
2.0000 mg | ORAL_TABLET | ORAL | Status: DC | PRN
Start: 2014-04-05 — End: 2014-04-06
  Administered 2014-04-06 (×2): 2 mg via ORAL
  Filled 2014-04-05 (×2): qty 2

## 2014-04-05 MED ORDER — METOPROLOL SUCCINATE ER 100 MG PO TB24
100.0000 mg | ORAL_TABLET | Freq: Every day | ORAL | Status: DC
  Administered 2014-04-05 – 2014-04-06 (×2): 100 mg via ORAL
  Filled 2014-04-05 (×2): qty 1

## 2014-04-05 MED ORDER — CLONIDINE HCL 0.1 MG PO TABS
0.1000 mg | ORAL_TABLET | Freq: Four times a day (QID) | ORAL | Status: DC
  Administered 2014-04-05 – 2014-04-06 (×3): 0.1 mg via ORAL
  Filled 2014-04-05 (×3): qty 1

## 2014-04-05 MED ORDER — THIAMINE HCL 100 MG/ML IJ SOLN: 100.0000 mg | Freq: Every day | INTRAMUSCULAR | Status: DC

## 2014-04-05 MED ORDER — LORAZEPAM 2 MG/ML IJ SOLN: 0.0000 mg | Freq: Two times a day (BID) | INTRAMUSCULAR | Status: DC

## 2014-04-05 MED ORDER — LORAZEPAM 1 MG PO TABS
0.0000 mg | ORAL_TABLET | Freq: Two times a day (BID) | ORAL | Status: DC
Start: 2014-04-07 — End: 2014-04-05
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
## 2014-04-05 NOTE — ED Notes (Signed)
Bed: WA30 Expected date:  Expected time:  Means of arrival:  Comments: 

## 2014-04-05 NOTE — BH Assessment (Signed)
TTS consult order at shift change pt to be seen when TTS day shift arrive.   Clista BernhardtNancy Trenace Coughlin, [facility] Triage Specialist 04/05/2014 7:09 AM
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
## 2014-04-05 NOTE — ED Notes (Addendum)
Pt c/o alcohol withdrawal (has been treated before, May 2014). Averages between a quart to a gallon of liquor/beer per day. Also drinks moonshine and says, "I think I got a hold of some bad stuff yesterday." Attempted to stop drinking yesterday and started vomiting moderate bright red blood. 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 Pt A&Ox4. RR even/unlabored. Tremors noted. Reports visual hallucinations. Denies SI/HI. Denies illicit drug use.

## 2014-04-05 NOTE — ED Notes (Signed)
Pt reports having nausea and is pacing in the room.  Pt denies any SI or HI. Pt not currently experiencing any hallucinations.  A/o x 4.  RR even/unlabored. Skin warm and dry.  Pt is currently eating breakfast.
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
## 2014-04-05 NOTE — ED Notes (Addendum)
Arrival to room, patient oxygen sat at 88%, sat patient up and went up to 94%. Patient more comfortable laying back, but oxygen drops. Will make nurse aware.

## 2014-04-05 NOTE — ED Notes (Signed)
Ebbie Ridgehris Lawyer, [place] made aware of pt's need for his home medications.

## 2014-04-05 NOTE — ED Notes (Addendum)
Notified RN, Scarlette CalicoFrances pt. i-stat Lactic Acid CG4 results 2.26.

## 2014-04-06 ENCOUNTER — Emergency Department (HOSPITAL_COMMUNITY): Payer: Medicare Other

## 2014-04-06 DIAGNOSIS — R05 Cough: Secondary | ICD-10-CM | POA: Diagnosis not present

## 2014-04-06 DIAGNOSIS — F11220 Opioid dependence with intoxication, uncomplicated: Secondary | ICD-10-CM | POA: Diagnosis not present

## 2014-04-06 DIAGNOSIS — F10230 Alcohol dependence with withdrawal, uncomplicated: Secondary | ICD-10-CM | POA: Diagnosis not present
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
## 2014-04-06 NOTE — [facility] Suicide Risk Assessment (Signed)
Suicide Risk Assessment  Discharge Assessment   [facility]   Demographic Factors:  Male and Caucasian  Total Time spent with patient: 30 minutes  Musculoskeletal: Strength & Muscle Tone: within normal limits Gait & Station: normal Patient leans: N/A  Psychiatric Specialty Exam:     Blood pressure 134/90, pulse 84, temperature 98 F (36.7 C), temperature source Oral, resp. rate 20, SpO2 96 %.There is no weight on file to calculate BMI.  General Appearance: Casual  Eye Contact::  Good  Speech:  Normal Rate  Volume:  Normal  Mood:  Euthymic  Affect:  Congruent  Thought Process:  Coherent  Orientation:  Full (Time, Place, and Person)  Thought Content:  WDL  Suicidal Thoughts:  No  Homicidal Thoughts:  No  Memory:  Good  Judgement:  Fair  Insight:  Fair  Psychomotor Activity:  Normal  Concentration:  Good  Recall:  Good  Fund of Knowledge: Good  Language: Good  Akathisia:  No  Handed:  Right  AIMS (if indicated):     Assets:  Housing Leisure Time Physical Health Resilience Social Support  ADL's:  Intact  Cognition: WNL  Sleep:         Has this patient used any form of tobacco in the last 30 days? (Cigarettes, Smokeless Tobacco, Cigars, and/or Pipes) Yes, A prescription for an FDA-approved tobacco cessation medication was offered at discharge and the patient refused  Mental Status Per Nursing Assessment::   On Admission:   Alcohol intoxication  Current Mental Status by Physician: NA  Loss Factors: NA  Historical Factors: NA  Risk Reduction Factors:   Sense of responsibility to family, Living with another person, especially a relative and Positive social support  Continued Clinical Symptoms:  None  Cognitive Features That Contribute To Risk:  None    Suicide Risk:  Minimal: No identifiable suicidal ideation.  Patients presenting with no risk factors but with morbid ruminations; may be classified as minimal risk based on  the severity of the depressive symptoms  Principal Problem: Alcohol dependence with uncomplicated withdrawal Discharge Diagnoses:  Patient Active Problem List   Diagnosis Date Noted  . Alcohol dependence with uncomplicated withdrawal [F10.230] 04/05/2014    Priority: High  . Polysubstance dependence including opioid type drug, continuous use [F11.229] 04/08/2011    Priority: High  . Alcohol abuse [F10.10]   . Atrial fibrillation with rapid ventricular response [I48.91] 11/12/2013  . Atrial fibrillation with RVR [I48.91] 11/12/2013  . Thrombocytopenia [D69.6] 11/12/2013  . Hypokalemia [E87.6] 11/12/2013  . Hyponatremia [E87.1] 11/12/2013  . Abscessed tooth [K04.7] 11/12/2013  . Abnormal liver function test [R79.89]   . Seizure [R56.9] 04/12/2011  . Nausea and vomiting [787.0] 04/08/2011  . Diarrhea [R19.7] 04/08/2011  . Substance induced mood disorder [F19.94] 04/08/2011  . Delirium tremens [F10.231] 10/30/2010  . Post traumatic stress disorder (PTSD) [F43.10] 10/30/2010  . Bipolar 1 disorder [F31.9] 10/30/2010  . H/O ETOH abuse [F10.10] 06/09/2010  . Fatty liver [K76.0] 04/19/2010  . Elevated liver enzymes [R74.8] 04/19/2010      Plan Of Care/Follow-up recommendations:  Activity:  as tolerated Diet:  heart healthy diet  Is patient on multiple antipsychotic therapies at discharge:  No   Has Patient had three or more failed trials of antipsychotic monotherapy by history:  No  Recommended Plan for Multiple Antipsychotic Therapies: NA    Gloriajean Okun, PMH-NP 04/06/2014, 3:47 PM

## 2014-04-06 NOTE — Discharge Instructions (Signed)
Finding Treatment for Alcohol and Drug Addiction It can be hard to find the right place to get professional treatment. Here are some important things to consider:  There are different types of treatment to choose from.  Some programs are live-in (residential) while others are not (outpatient). Sometimes a combination is offered.  No single type of program is right for everyone.  Most treatment programs involve a combination of education, counseling, and a 12-step, spiritually-based approach.  There are non-spiritually based programs (not 12-step).  Some treatment programs are government sponsored. They are geared for patients without private insurance.  Treatment programs can vary in many respects such as:  Cost and types of insurance accepted.  Types of on-site medical services offered.  Length of stay, setting, and size.  Overall philosophy of treatment. A person may need specialized treatment or have needs not addressed by all programs. For example, adolescents need treatment appropriate for their age. Other people have secondary disorders that must be managed as well. Secondary conditions can include mental illness, such as depression or diabetes. Often, a period of detoxification from alcohol or drugs is needed. This requires medical supervision and not all programs offer this. THINGS TO CONSIDER WHEN SELECTING A TREATMENT PROGRAM   Is the program certified by the appropriate government agency? Even private programs must be certified and employ certified professionals.  Does the program accept your insurance? If not, can a payment plan be set up?  Is the facility clean, organized, and well run? Do they allow you to speak with graduates who can share their treatment experience with you? Can you tour the facility? Can you meet with staff?  Does the program meet the full range of individual needs?  Does the treatment program address sexual orientation and physical disabilities?  Do they provide age, gender, and culturally appropriate treatment services?  Is treatment available in languages other than English?  Is long-term aftercare support or guidance encouraged and provided?  Is assessment of an individual's treatment plan ongoing to ensure it meets changing needs?  Does the program use strategies to encourage reluctant patients to remain in treatment long enough to increase the likelihood of success?  Does the program offer counseling (individual or group) and other behavioral therapies?  Does the program offer medicine as part of the treatment regimen, if needed?  Is there ongoing monitoring of possible relapse? Is there a defined relapse prevention program? Are services or referrals offered to family members to ensure they understand addiction and the recovery process? This would help them support the recovering individual.  Are 12-step meetings held at the center or is transport available for patients to attend outside meetings? In countries outside of the [place], see local directories for contact information for services in your area. Document Released: 11/25/2004 Document Revised: 03/21/2011 Document Reviewed: 06/07/2007 Exit[employer] Patient Information 2015 [employer]. This information is not intended to replace advice given to you by your health care provider. Make sure you discuss any questions you have with your health care provider.  

## 2014-04-06 NOTE — ED Notes (Addendum)
Patient requested his prn Ativan, saying he was still a little shaky.  Patient taking shower, wants to keep on his own clothes because he says he will go home if "they are not going to help me withdraw from the suboxone." " I've been taking it for four years."

## 2014-06-10 DIAGNOSIS — M25519 Pain in unspecified shoulder: Secondary | ICD-10-CM | POA: Diagnosis not present

## 2014-06-10 DIAGNOSIS — G894 Chronic pain syndrome: Secondary | ICD-10-CM | POA: Diagnosis not present

## 2014-06-10 DIAGNOSIS — G8929 Other chronic pain: Secondary | ICD-10-CM | POA: Diagnosis not present

## 2014-06-10 DIAGNOSIS — M79606 Pain in leg, unspecified: Secondary | ICD-10-CM | POA: Diagnosis not present

## 2014-06-15 ENCOUNTER — Emergency Department (HOSPITAL_COMMUNITY): Payer: Medicare Other

## 2014-06-15 ENCOUNTER — Encounter (HOSPITAL_COMMUNITY): Payer: Self-pay | Admitting: Emergency Medicine

## 2014-06-15 ENCOUNTER — Emergency Department (HOSPITAL_COMMUNITY)
Admission: EM | Admit: 2014-06-15 | Discharge: 2014-06-16 | Disposition: A | Discharge status: Home / Self Care | Payer: Medicare Other | Attending: Emergency Medicine | Admitting: Emergency Medicine

## 2014-06-15 DIAGNOSIS — Z720 Tobacco use: Secondary | ICD-10-CM | POA: Insufficient documentation

## 2014-06-15 DIAGNOSIS — S3991XA Unspecified injury of abdomen, initial encounter: Secondary | ICD-10-CM | POA: Diagnosis not present

## 2014-06-15 DIAGNOSIS — S0181XA Laceration without foreign body of other part of head, initial encounter: Secondary | ICD-10-CM | POA: Diagnosis not present

## 2014-06-15 DIAGNOSIS — Z79899 Other long term (current) drug therapy: Secondary | ICD-10-CM | POA: Insufficient documentation

## 2014-06-15 DIAGNOSIS — F10129 Alcohol abuse with intoxication, unspecified: Secondary | ICD-10-CM | POA: Diagnosis not present

## 2014-06-15 DIAGNOSIS — S59902A Unspecified injury of left elbow, initial encounter: Secondary | ICD-10-CM | POA: Insufficient documentation

## 2014-06-15 DIAGNOSIS — Y998 Other external cause status: Secondary | ICD-10-CM | POA: Diagnosis not present

## 2014-06-15 DIAGNOSIS — Y9389 Activity, other specified: Secondary | ICD-10-CM | POA: Diagnosis not present

## 2014-06-15 DIAGNOSIS — R062 Wheezing: Secondary | ICD-10-CM | POA: Insufficient documentation

## 2014-06-15 DIAGNOSIS — S6991XA Unspecified injury of right wrist, hand and finger(s), initial encounter: Secondary | ICD-10-CM | POA: Diagnosis not present

## 2014-06-15 DIAGNOSIS — H1133 Conjunctival hemorrhage, bilateral: Secondary | ICD-10-CM | POA: Insufficient documentation

## 2014-06-15 DIAGNOSIS — S638X2A Sprain of other part of left wrist and hand, initial encounter: Secondary | ICD-10-CM | POA: Diagnosis not present

## 2014-06-15 DIAGNOSIS — G40909 Epilepsy, unspecified, not intractable, without status epilepticus: Secondary | ICD-10-CM | POA: Insufficient documentation

## 2014-06-15 DIAGNOSIS — S0990XA Unspecified injury of head, initial encounter: Secondary | ICD-10-CM | POA: Diagnosis not present

## 2014-06-15 DIAGNOSIS — R0781 Pleurodynia: Secondary | ICD-10-CM | POA: Diagnosis not present

## 2014-06-15 DIAGNOSIS — S0083XA Contusion of other part of head, initial encounter: Secondary | ICD-10-CM | POA: Diagnosis not present

## 2014-06-15 DIAGNOSIS — S098XXA Other specified injuries of head, initial encounter: Secondary | ICD-10-CM | POA: Diagnosis not present

## 2014-06-15 DIAGNOSIS — Y9289 Other specified places as the place of occurrence of the external cause: Secondary | ICD-10-CM | POA: Diagnosis not present

## 2014-06-15 DIAGNOSIS — S638X1A Sprain of other part of right wrist and hand, initial encounter: Secondary | ICD-10-CM | POA: Insufficient documentation

## 2014-06-15 DIAGNOSIS — S20222A Contusion of left back wall of thorax, initial encounter: Secondary | ICD-10-CM | POA: Diagnosis not present

## 2014-06-15 DIAGNOSIS — S301XXA Contusion of abdominal wall, initial encounter: Secondary | ICD-10-CM | POA: Insufficient documentation

## 2014-06-15 DIAGNOSIS — R000 Tachycardia, unspecified: Secondary | ICD-10-CM | POA: Insufficient documentation

## 2014-06-15 DIAGNOSIS — S80812A Abrasion, left lower leg, initial encounter: Secondary | ICD-10-CM | POA: Insufficient documentation

## 2014-06-15 DIAGNOSIS — S6392XA Sprain of unspecified part of left wrist and hand, initial encounter: Secondary | ICD-10-CM

## 2014-06-15 DIAGNOSIS — M79642 Pain in left hand: Secondary | ICD-10-CM | POA: Diagnosis not present

## 2014-06-15 DIAGNOSIS — F319 Bipolar disorder, unspecified: Secondary | ICD-10-CM | POA: Diagnosis not present

## 2014-06-15 DIAGNOSIS — S6391XA Sprain of unspecified part of right wrist and hand, initial encounter: Secondary | ICD-10-CM | POA: Diagnosis not present

## 2014-06-15 DIAGNOSIS — M7989 Other specified soft tissue disorders: Secondary | ICD-10-CM | POA: Diagnosis not present

## 2014-06-15 DIAGNOSIS — S80811A Abrasion, right lower leg, initial encounter: Secondary | ICD-10-CM | POA: Diagnosis not present

## 2014-06-15 DIAGNOSIS — F4310 Post-traumatic stress disorder, unspecified: Secondary | ICD-10-CM | POA: Diagnosis not present

## 2014-06-15 DIAGNOSIS — R079 Chest pain, unspecified: Secondary | ICD-10-CM | POA: Diagnosis not present

## 2014-06-15 DIAGNOSIS — R102 Pelvic and perineal pain: Secondary | ICD-10-CM | POA: Diagnosis not present

## 2014-06-15 DIAGNOSIS — S40012A Contusion of left shoulder, initial encounter: Secondary | ICD-10-CM | POA: Diagnosis not present

## 2014-06-15 DIAGNOSIS — F10920 Alcohol use, unspecified with intoxication, uncomplicated: Secondary | ICD-10-CM

## 2014-06-15 DIAGNOSIS — S161XXA Strain of muscle, fascia and tendon at neck level, initial encounter: Secondary | ICD-10-CM | POA: Diagnosis not present

## 2014-06-15 DIAGNOSIS — M25532 Pain in left wrist: Secondary | ICD-10-CM | POA: Diagnosis not present

## 2014-06-15 DIAGNOSIS — R109 Unspecified abdominal pain: Secondary | ICD-10-CM | POA: Diagnosis not present

## 2014-06-15 DIAGNOSIS — S199XXA Unspecified injury of neck, initial encounter: Secondary | ICD-10-CM | POA: Diagnosis not present

## 2014-06-15 DIAGNOSIS — S6992XA Unspecified injury of left wrist, hand and finger(s), initial encounter: Secondary | ICD-10-CM | POA: Diagnosis not present

## 2014-06-15 MED ORDER — STERILE WATER FOR INJECTION IJ SOLN
INTRAMUSCULAR | Status: AC
  Filled 2014-06-15: qty 10

## 2014-06-15 MED ORDER — SODIUM CHLORIDE 0.9 % IV BOLUS (SEPSIS)
1000.0000 mL | Freq: Once | INTRAVENOUS | Status: AC
  Administered 2014-06-16: 1000 mL via INTRAVENOUS

## 2014-06-15 MED ORDER — ZIPRASIDONE MESYLATE 20 MG IM SOLR
10.0000 mg | Freq: Once | INTRAMUSCULAR | Status: AC
  Administered 2014-06-15: 10 mg via INTRAMUSCULAR
  Filled 2014-06-15: qty 20
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
## 2014-06-15 NOTE — ED Notes (Signed)
Pt stating that he wants to leave. Dr. Jodi MourningZavitz made aware. IVC papers being completed at this time.
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
## 2014-06-16 DIAGNOSIS — S6991XA Unspecified injury of right wrist, hand and finger(s), initial encounter: Secondary | ICD-10-CM | POA: Diagnosis not present

## 2014-06-16 DIAGNOSIS — M7989 Other specified soft tissue disorders: Secondary | ICD-10-CM | POA: Diagnosis not present

## 2014-06-16 DIAGNOSIS — S0083XA Contusion of other part of head, initial encounter: Secondary | ICD-10-CM | POA: Diagnosis not present

## 2014-06-16 DIAGNOSIS — M25532 Pain in left wrist: Secondary | ICD-10-CM | POA: Diagnosis not present

## 2014-06-16 DIAGNOSIS — S199XXA Unspecified injury of neck, initial encounter: Secondary | ICD-10-CM | POA: Diagnosis not present

## 2014-06-16 DIAGNOSIS — M79642 Pain in left hand: Secondary | ICD-10-CM | POA: Diagnosis not present

## 2014-06-16 DIAGNOSIS — R102 Pelvic and perineal pain: Secondary | ICD-10-CM | POA: Diagnosis not present

## 2014-06-16 DIAGNOSIS — S3991XA Unspecified injury of abdomen, initial encounter: Secondary | ICD-10-CM | POA: Diagnosis not present

## 2014-06-16 DIAGNOSIS — S0990XA Unspecified injury of head, initial encounter: Secondary | ICD-10-CM | POA: Diagnosis not present

## 2014-06-16 DIAGNOSIS — R109 Unspecified abdominal pain: Secondary | ICD-10-CM | POA: Diagnosis not present

## 2014-06-16 DIAGNOSIS — S6992XA Unspecified injury of left wrist, hand and finger(s), initial encounter: Secondary | ICD-10-CM | POA: Diagnosis not present

## 2014-06-16 LAB — TYPE AND SCREEN
ABO/RH(D): O POS
Antibody Screen: NEGATIVE

## 2014-06-16 LAB — CBC WITH DIFFERENTIAL/PLATELET
Basophils Absolute: 0.1 10*3/uL (ref 0.0–0.1)
Basophils Relative: 2 % — ABNORMAL HIGH (ref 0–1)
Eosinophils Absolute: 0.2 10*3/uL (ref 0.0–0.7)
Eosinophils Relative: 3 % (ref 0–5)
HCT: 39.2 % (ref 39.0–52.0)
Hemoglobin: 12.8 g/dL — ABNORMAL LOW (ref 13.0–17.0)
Lymphocytes Relative: 37 % (ref 12–46)
Lymphs Abs: 2 10*3/uL (ref 0.7–4.0)
MCH: 32.3 pg (ref 26.0–34.0)
MCHC: 32.7 g/dL (ref 30.0–36.0)
MCV: 99 fL (ref 78.0–100.0)
Monocytes Absolute: 0.4 10*3/uL (ref 0.1–1.0)
Monocytes Relative: 8 % (ref 3–12)
Neutro Abs: 2.7 10*3/uL (ref 1.7–7.7)
Neutrophils Relative %: 50 % (ref 43–77)
Platelets: 151 10*3/uL (ref 150–400)
RBC: 3.96 MIL/uL — ABNORMAL LOW (ref 4.22–5.81)
RDW: 13.4 % (ref 11.5–15.5)
WBC: 5.5 10*3/uL (ref 4.0–10.5)

## 2014-06-16 LAB — BASIC METABOLIC PANEL
Anion gap: 9 (ref 5–15)
BUN: <5 mg/dL — ABNORMAL LOW (ref 6–20)
CO2: 26 mmol/L (ref 22–32)
Calcium: 7.7 mg/dL — ABNORMAL LOW (ref 8.9–10.3)
Chloride: 112 mmol/L — ABNORMAL HIGH (ref 101–111)
Creatinine, Ser: 0.61 mg/dL (ref 0.61–1.24)
GFR calc Af Amer: >60 mL/min (ref >60)
GFR calc non Af Amer: >60 mL/min (ref >60)
Glucose, Bld: 103 mg/dL — ABNORMAL HIGH (ref 65–99)
Potassium: 3.4 mmol/L — ABNORMAL LOW (ref 3.5–5.1)
Sodium: 147 mmol/L — ABNORMAL HIGH (ref 135–145)

## 2014-06-16 LAB — SALICYLATE LEVEL: Salicylate Lvl: <4 mg/dL (ref 2.8–30.0)

## 2014-06-16 LAB — ETHANOL: Alcohol, Ethyl (B): 322 mg/dL (ref <5)

## 2014-06-16 LAB — ACETAMINOPHEN LEVEL: Acetaminophen (Tylenol), Serum: <10 ug/mL — ABNORMAL LOW (ref 10–30)

## 2014-06-16 MED ORDER — IOHEXOL 300 MG/ML  SOLN
100.0000 mL | Freq: Once | INTRAMUSCULAR | Status: AC | PRN
  Administered 2014-06-16: 100 mL via INTRAVENOUS
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
## 2014-06-16 NOTE — ED Notes (Signed)
Pt. Placed on 2L [place], O2 sats 95%

## 2014-06-16 NOTE — ED Notes (Signed)
[name] resendig IVC papers at this time and stated he would d/c patient shortly. Meal tray ordered for pt and pt got dressed back in his personal clothes.

## 2014-06-16 NOTE — ED Notes (Signed)
CRITICAL VALUE ALERT  Critical value received:  Alcohol 322  Date of notification:  06/16/14  Time of notification:  0100  Critical value read back:yes  Nurse who received alert:  Juanita LasterAmanda Yussuf Sawyers  MD notified (1st page):  [place]  Time of first page:  0105  MD notified (2nd page):  Time of second page:  Responding MD:  Bebe ShaggyWickline  Time MD responded:  [phone]

## 2014-06-16 NOTE — Discharge Instructions (Signed)
Follow-up with local mental health services.  Ice pack to painful areas. Tylenol for pain.

## 2014-06-16 NOTE — ED Provider Notes (Signed)
I assumed care from dr Jodi Mourningzavitz Pt is now resting comfortably Imaging is pending at this time  Dean [name] Nayelli Inglis, MD 06/16/14 [phone]
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
## 2014-06-16 NOTE — ED Notes (Signed)
Communications called and they are contacting [place] [employer] to provide pt transportation home.

## 2014-06-21 ENCOUNTER — Encounter (HOSPITAL_COMMUNITY): Payer: Self-pay | Admitting: Emergency Medicine

## 2014-06-21 ENCOUNTER — Emergency Department (HOSPITAL_COMMUNITY)
Admission: EM | Admit: 2014-06-21 | Discharge: 2014-06-21 | Disposition: A | Discharge status: Home / Self Care | Payer: Medicare Other | Source: Home / Self Care | Attending: Emergency Medicine | Admitting: Emergency Medicine

## 2014-06-21 DIAGNOSIS — S0990XD Unspecified injury of head, subsequent encounter: Secondary | ICD-10-CM | POA: Insufficient documentation

## 2014-06-21 DIAGNOSIS — Z79899 Other long term (current) drug therapy: Secondary | ICD-10-CM

## 2014-06-21 DIAGNOSIS — R4182 Altered mental status, unspecified: Secondary | ICD-10-CM | POA: Diagnosis not present

## 2014-06-21 DIAGNOSIS — G40909 Epilepsy, unspecified, not intractable, without status epilepticus: Secondary | ICD-10-CM

## 2014-06-21 DIAGNOSIS — S060X9D Concussion with loss of consciousness of unspecified duration, subsequent encounter: Secondary | ICD-10-CM | POA: Diagnosis not present

## 2014-06-21 DIAGNOSIS — Z720 Tobacco use: Secondary | ICD-10-CM

## 2014-06-21 DIAGNOSIS — M542 Cervicalgia: Secondary | ICD-10-CM | POA: Insufficient documentation

## 2014-06-21 DIAGNOSIS — R630 Anorexia: Secondary | ICD-10-CM | POA: Insufficient documentation

## 2014-06-21 DIAGNOSIS — M2550 Pain in unspecified joint: Secondary | ICD-10-CM | POA: Insufficient documentation

## 2014-06-21 DIAGNOSIS — S0990XA Unspecified injury of head, initial encounter: Secondary | ICD-10-CM

## 2014-06-21 DIAGNOSIS — F319 Bipolar disorder, unspecified: Secondary | ICD-10-CM | POA: Insufficient documentation

## 2014-06-21 DIAGNOSIS — R109 Unspecified abdominal pain: Secondary | ICD-10-CM

## 2014-06-21 DIAGNOSIS — F4310 Post-traumatic stress disorder, unspecified: Secondary | ICD-10-CM

## 2014-06-21 DIAGNOSIS — S060X2D Concussion with loss of consciousness of 31 minutes to 59 minutes, subsequent encounter: Secondary | ICD-10-CM

## 2014-06-21 DIAGNOSIS — R51 Headache: Secondary | ICD-10-CM | POA: Diagnosis not present

## 2014-06-21 DIAGNOSIS — S060X9A Concussion with loss of consciousness of unspecified duration, initial encounter: Secondary | ICD-10-CM | POA: Diagnosis not present

## 2014-06-21 DIAGNOSIS — R220 Localized swelling, mass and lump, head: Secondary | ICD-10-CM | POA: Diagnosis not present
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
## 2014-06-21 NOTE — ED Notes (Signed)
Questions r/t dc denied. Pt ambulaltory  And a&ox4

## 2014-06-21 NOTE — Discharge Instructions (Signed)
If you were given medicines take as directed.  If you are on coumadin or contraceptives realize their levels and effectiveness is altered by many different medicines.  If you have any reaction (rash, tongues swelling, other) to the medicines stop taking and see a physician.    If your blood pressure was elevated in the ER make sure you follow up for management with a primary doctor or return for chest pain, shortness of breath or stroke symptoms.  Please follow up as directed and return to the ER or see a physician for new or worsening symptoms.  Thank you. Filed Vitals:   06/21/14 1730  BP: 110/72  Pulse: 75  Temp: 98 F (36.7 C)  TempSrc: Oral  Resp: 16  SpO2: 92%    Concussion A concussion, or closed-head injury, is a brain injury caused by a direct blow to the head or by a quick and sudden movement (jolt) of the head or neck. Concussions are usually not life-threatening. Even so, the effects of a concussion can be serious. If you have had a concussion before, you are more likely to experience concussion-like symptoms after a direct blow to the head.  CAUSES  Direct blow to the head, such as from running into another player during a soccer game, being hit in a fight, or hitting your head on a hard surface.  A jolt of the head or neck that causes the brain to move back and forth inside the skull, such as in a car crash. SIGNS AND SYMPTOMS The signs of a concussion can be hard to notice. Early on, they may be missed by you, family members, and health care providers. You may look fine but act or feel differently. Symptoms are usually temporary, but they may last for days, weeks, or even longer. Some symptoms may appear right away while others may not show up for hours or days. Every head injury is different. Symptoms include:  Mild to moderate headaches that will not go away.  A feeling of pressure inside your head.  Having more trouble than usual:  Learning or remembering things you  have heard.  Answering questions.  Paying attention or concentrating.  Organizing daily tasks.  Making decisions and solving problems.  Slowness in thinking, acting or reacting, speaking, or reading.  Getting lost or being easily confused.  Feeling tired all the time or lacking energy (fatigued).  Feeling drowsy.  Sleep disturbances.  Sleeping more than usual.  Sleeping less than usual.  Trouble falling asleep.  Trouble sleeping (insomnia).  Loss of balance or feeling lightheaded or dizzy.  Nausea or vomiting.  Numbness or tingling.  Increased sensitivity to:  Sounds.  Lights.  Distractions.  Vision problems or eyes that tire easily.  Diminished sense of taste or smell.  Ringing in the ears.  Mood changes such as feeling sad or anxious.  Becoming easily irritated or angry for little or no reason.  Lack of motivation.  Seeing or hearing things other people do not see or hear (hallucinations). DIAGNOSIS Your health care provider can usually diagnose a concussion based on a description of your injury and symptoms. He or she will ask whether you passed out (lost consciousness) and whether you are having trouble remembering events that happened right before and during your injury. Your evaluation might include:  A brain scan to look for signs of injury to the brain. Even if the test shows no injury, you may still have a concussion.  Blood tests to be sure  other problems are not present. TREATMENT  Concussions are usually treated in an emergency department, in urgent care, or at a clinic. You may need to stay in the hospital overnight for further treatment.  Tell your health care provider if you are taking any medicines, including prescription medicines, over-the-counter medicines, and natural remedies. Some medicines, such as blood thinners (anticoagulants) and aspirin, may increase the chance of complications. Also tell your health care provider whether  you have had alcohol or are taking illegal drugs. This information may affect treatment.  Your health care provider will send you home with important instructions to follow.  How fast you will recover from a concussion depends on many factors. These factors include how severe your concussion is, what part of your brain was injured, your age, and how healthy you were before the concussion.  Most people with mild injuries recover fully. Recovery can take time. In general, recovery is slower in older persons. Also, persons who have had a concussion in the past or have other medical problems may find that it takes longer to recover from their current injury. HOME CARE INSTRUCTIONS General Instructions  Carefully follow the directions your health care provider gave you.  Only take over-the-counter or prescription medicines for pain, discomfort, or fever as directed by your health care provider.  Take only those medicines that your health care provider has approved.  Do not drink alcohol until your health care provider says you are well enough to do so. Alcohol and certain other drugs may slow your recovery and can put you at risk of further injury.  If it is harder than usual to remember things, write them down.  If you are easily distracted, try to do one thing at a time. For example, do not try to watch TV while fixing dinner.  Talk with family members or close friends when making important decisions.  Keep all follow-up appointments. Repeated evaluation of your symptoms is recommended for your recovery.  Watch your symptoms and tell others to do the same. Complications sometimes occur after a concussion. Older adults with a brain injury may have a higher risk of serious complications, such as a blood clot on the brain.  Tell your teachers, school nurse, school counselor, coach, athletic trainer, or work Production designer, theatre/television/film about your injury, symptoms, and restrictions. Tell them about what you can or  cannot do. They should watch for:  Increased problems with attention or concentration.  Increased difficulty remembering or learning new information.  Increased time needed to complete tasks or assignments.  Increased irritability or decreased ability to cope with stress.  Increased symptoms.  Rest. Rest helps the brain to heal. Make sure you:  Get plenty of sleep at night. Avoid staying up late at night.  Keep the same bedtime hours on weekends and weekdays.  Rest during the day. Take daytime naps or rest breaks when you feel tired.  Limit activities that require a lot of thought or concentration. These include:  Doing homework or job-related work.  Watching TV.  Working on the computer.  Avoid any situation where there is potential for another head injury (football, hockey, soccer, basketball, martial arts, downhill snow sports and horseback riding). Your condition will get worse every time you experience a concussion. You should avoid these activities until you are evaluated by the appropriate follow-up health care providers. Returning To Your Regular Activities You will need to return to your normal activities slowly, not all at once. You must give your body and  brain enough time for recovery.  Do not return to sports or other athletic activities until your health care provider tells you it is safe to do so.  Ask your health care provider when you can drive, ride a bicycle, or operate heavy machinery. Your ability to react may be slower after a brain injury. Never do these activities if you are dizzy.  Ask your health care provider about when you can return to work or school. Preventing Another Concussion It is very important to avoid another brain injury, especially before you have recovered. In rare cases, another injury can lead to permanent brain damage, brain swelling, or death. The risk of this is greatest during the first 7-10 days after a head injury. Avoid injuries  by:  Wearing a seat belt when riding in a car.  Drinking alcohol only in moderation.  Wearing a helmet when biking, skiing, skateboarding, skating, or doing similar activities.  Avoiding activities that could lead to a second concussion, such as contact or recreational sports, until your health care provider says it is okay.  Taking safety measures in your home.  Remove clutter and tripping hazards from floors and stairways.  Use grab bars in bathrooms and handrails by stairs.  Place non-slip mats on floors and in bathtubs.  Improve lighting in dim areas. SEEK MEDICAL CARE IF:  You have increased problems paying attention or concentrating.  You have increased difficulty remembering or learning new information.  You need more time to complete tasks or assignments than before.  You have increased irritability or decreased ability to cope with stress.  You have more symptoms than before. Seek medical care if you have any of the following symptoms for more than 2 weeks after your injury:  Lasting (chronic) headaches.  Dizziness or balance problems.  Nausea.  Vision problems.  Increased sensitivity to noise or light.  Depression or mood swings.  Anxiety or irritability.  Memory problems.  Difficulty concentrating or paying attention.  Sleep problems.  Feeling tired all the time. SEEK IMMEDIATE MEDICAL CARE IF:  You have severe or worsening headaches. These may be a sign of a blood clot in the brain.  You have weakness (even if only in one hand, leg, or part of the face).  You have numbness.  You have decreased coordination.  You vomit repeatedly.  You have increased sleepiness.  One pupil is larger than the other.  You have convulsions.  You have slurred speech.  You have increased confusion. This may be a sign of a blood clot in the brain.  You have increased restlessness, agitation, or irritability.  You are unable to recognize people or  places.  You have neck pain.  It is difficult to wake you up.  You have unusual behavior changes.  You lose consciousness. MAKE SURE YOU:  Understand these instructions.  Will watch your condition.  Will get help right away if you are not doing well or get worse. Document Released: 03/19/2003 Document Revised: 01/01/2013 Document Reviewed: 07/19/2012 [facility] Patient Information 2015 [place], [place]. This information is not intended to replace advice given to you by your health care provider. Make sure you discuss any questions you have with your health care provider.
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
## 2014-06-23 ENCOUNTER — Emergency Department (HOSPITAL_COMMUNITY): Payer: Medicare Other

## 2014-06-23 ENCOUNTER — Encounter (HOSPITAL_COMMUNITY): Payer: Self-pay | Admitting: Emergency Medicine

## 2014-06-23 ENCOUNTER — Inpatient Hospital Stay (HOSPITAL_COMMUNITY)
Admission: EM | Admit: 2014-06-23 | Discharge: 2014-06-25 | DRG: 090 | Disposition: A | Discharge status: Home / Self Care | Payer: Medicare Other | Attending: Pulmonary Disease | Admitting: Pulmonary Disease

## 2014-06-23 DIAGNOSIS — R4182 Altered mental status, unspecified: Secondary | ICD-10-CM | POA: Diagnosis present

## 2014-06-23 DIAGNOSIS — R220 Localized swelling, mass and lump, head: Secondary | ICD-10-CM | POA: Diagnosis not present

## 2014-06-23 DIAGNOSIS — G894 Chronic pain syndrome: Secondary | ICD-10-CM | POA: Diagnosis present

## 2014-06-23 DIAGNOSIS — G40909 Epilepsy, unspecified, not intractable, without status epilepticus: Secondary | ICD-10-CM | POA: Diagnosis not present

## 2014-06-23 DIAGNOSIS — F1010 Alcohol abuse, uncomplicated: Secondary | ICD-10-CM | POA: Diagnosis present

## 2014-06-23 DIAGNOSIS — F4310 Post-traumatic stress disorder, unspecified: Secondary | ICD-10-CM | POA: Diagnosis present

## 2014-06-23 DIAGNOSIS — F17210 Nicotine dependence, cigarettes, uncomplicated: Secondary | ICD-10-CM | POA: Diagnosis present

## 2014-06-23 DIAGNOSIS — S0990XA Unspecified injury of head, initial encounter: Secondary | ICD-10-CM | POA: Diagnosis not present

## 2014-06-23 DIAGNOSIS — R410 Disorientation, unspecified: Secondary | ICD-10-CM | POA: Diagnosis not present

## 2014-06-23 DIAGNOSIS — S060X9D Concussion with loss of consciousness of unspecified duration, subsequent encounter: Secondary | ICD-10-CM | POA: Diagnosis not present

## 2014-06-23 DIAGNOSIS — F11220 Opioid dependence with intoxication, uncomplicated: Secondary | ICD-10-CM

## 2014-06-23 DIAGNOSIS — F1110 Opioid abuse, uncomplicated: Secondary | ICD-10-CM | POA: Diagnosis present

## 2014-06-23 DIAGNOSIS — R109 Unspecified abdominal pain: Secondary | ICD-10-CM | POA: Diagnosis present

## 2014-06-23 DIAGNOSIS — F319 Bipolar disorder, unspecified: Secondary | ICD-10-CM | POA: Diagnosis present

## 2014-06-23 DIAGNOSIS — S060X9A Concussion with loss of consciousness of unspecified duration, initial encounter: Secondary | ICD-10-CM | POA: Diagnosis present

## 2014-06-23 DIAGNOSIS — F1120 Opioid dependence, uncomplicated: Secondary | ICD-10-CM | POA: Diagnosis present

## 2014-06-23 DIAGNOSIS — M1990 Unspecified osteoarthritis, unspecified site: Secondary | ICD-10-CM | POA: Diagnosis present

## 2014-06-23 DIAGNOSIS — R630 Anorexia: Secondary | ICD-10-CM | POA: Diagnosis present

## 2014-06-23 DIAGNOSIS — M542 Cervicalgia: Secondary | ICD-10-CM | POA: Diagnosis present

## 2014-06-23 DIAGNOSIS — F1920 Other psychoactive substance dependence, uncomplicated: Secondary | ICD-10-CM

## 2014-06-23 DIAGNOSIS — Z720 Tobacco use: Secondary | ICD-10-CM | POA: Diagnosis not present

## 2014-06-23 DIAGNOSIS — Z79899 Other long term (current) drug therapy: Secondary | ICD-10-CM

## 2014-06-23 DIAGNOSIS — S299XXA Unspecified injury of thorax, initial encounter: Secondary | ICD-10-CM | POA: Diagnosis not present

## 2014-06-23 DIAGNOSIS — S0990XD Unspecified injury of head, subsequent encounter: Secondary | ICD-10-CM | POA: Diagnosis not present

## 2014-06-23 DIAGNOSIS — R569 Unspecified convulsions: Secondary | ICD-10-CM | POA: Diagnosis not present

## 2014-06-23 DIAGNOSIS — M2550 Pain in unspecified joint: Secondary | ICD-10-CM | POA: Diagnosis present

## 2014-06-23 LAB — BASIC METABOLIC PANEL
Anion gap: 7 (ref 5–15)
BUN: 10 mg/dL (ref 6–20)
CO2: 31 mmol/L (ref 22–32)
Calcium: 8.8 mg/dL — ABNORMAL LOW (ref 8.9–10.3)
Chloride: 99 mmol/L — ABNORMAL LOW (ref 101–111)
Creatinine, Ser: 0.73 mg/dL (ref 0.61–1.24)
GFR calc Af Amer: >60 mL/min (ref >60)
GFR calc non Af Amer: >60 mL/min (ref >60)
Glucose, Bld: 108 mg/dL — ABNORMAL HIGH (ref 65–99)
Potassium: 3.8 mmol/L (ref 3.5–5.1)
Sodium: 137 mmol/L (ref 135–145)

## 2014-06-23 LAB — CBC WITH DIFFERENTIAL/PLATELET
Basophils Absolute: 0 10*3/uL (ref 0.0–0.1)
Basophils Relative: 0 % (ref 0–1)
Eosinophils Absolute: 0.3 10*3/uL (ref 0.0–0.7)
Eosinophils Relative: 5 % (ref 0–5)
HCT: 40.1 % (ref 39.0–52.0)
Hemoglobin: 13.2 g/dL (ref 13.0–17.0)
Lymphocytes Relative: 22 % (ref 12–46)
Lymphs Abs: 1.5 10*3/uL (ref 0.7–4.0)
MCH: 32.1 pg (ref 26.0–34.0)
MCHC: 32.9 g/dL (ref 30.0–36.0)
MCV: 97.6 fL (ref 78.0–100.0)
Monocytes Absolute: 0.5 10*3/uL (ref 0.1–1.0)
Monocytes Relative: 7 % (ref 3–12)
Neutro Abs: 4.6 10*3/uL (ref 1.7–7.7)
Neutrophils Relative %: 66 % (ref 43–77)
Platelets: 206 10*3/uL (ref 150–400)
RBC: 4.11 MIL/uL — ABNORMAL LOW (ref 4.22–5.81)
RDW: 13 % (ref 11.5–15.5)
WBC: 7 10*3/uL (ref 4.0–10.5)

## 2014-06-23 LAB — URINALYSIS, ROUTINE W REFLEX MICROSCOPIC
Bilirubin Urine: NEGATIVE
Glucose, UA: NEGATIVE mg/dL
Hgb urine dipstick: NEGATIVE
Ketones, ur: NEGATIVE mg/dL
Leukocytes, UA: NEGATIVE
Nitrite: NEGATIVE
Protein, ur: NEGATIVE mg/dL
Specific Gravity, Urine: 1.01 (ref 1.005–1.030)
Urobilinogen, UA: 0.2 mg/dL (ref 0.0–1.0)
pH: 5.5 (ref 5.0–8.0)

## 2014-06-23 LAB — RAPID URINE DRUG SCREEN, HOSP PERFORMED
Amphetamines: NOT DETECTED
Barbiturates: NOT DETECTED
Benzodiazepines: POSITIVE — AB
Cocaine: POSITIVE — AB
Opiates: NOT DETECTED
Tetrahydrocannabinol: NOT DETECTED

## 2014-06-23 LAB — ETHANOL: Alcohol, Ethyl (B): <5 mg/dL (ref <5)

## 2014-06-23 MED ORDER — ONDANSETRON HCL 4 MG/2ML IJ SOLN: 4.0000 mg | Freq: Four times a day (QID) | INTRAMUSCULAR | Status: DC | PRN

## 2014-06-23 MED ORDER — CLONIDINE HCL 0.1 MG PO TABS
0.1000 mg | ORAL_TABLET | Freq: Two times a day (BID) | ORAL | Status: DC
  Administered 2014-06-24 – 2014-06-25 (×4): 0.1 mg via ORAL
  Filled 2014-06-23 (×4): qty 1

## 2014-06-23 MED ORDER — METOPROLOL SUCCINATE ER 50 MG PO TB24
100.0000 mg | ORAL_TABLET | Freq: Every day | ORAL | Status: DC
  Administered 2014-06-24 – 2014-06-25 (×2): 100 mg via ORAL
  Filled 2014-06-23: qty 2
  Filled 2014-06-23 (×2): qty 1
  Filled 2014-06-23: qty 2

## 2014-06-23 MED ORDER — PANTOPRAZOLE SODIUM 40 MG PO TBEC
40.0000 mg | DELAYED_RELEASE_TABLET | Freq: Every day | ORAL | Status: DC
  Administered 2014-06-24 – 2014-06-25 (×2): 40 mg via ORAL
  Filled 2014-06-23 (×2): qty 1

## 2014-06-23 MED ORDER — ONDANSETRON HCL 4 MG PO TABS: 4.0000 mg | ORAL_TABLET | Freq: Four times a day (QID) | ORAL | Status: DC | PRN

## 2014-06-23 MED ORDER — LEVETIRACETAM 500 MG PO TABS
500.0000 mg | ORAL_TABLET | Freq: Two times a day (BID) | ORAL | Status: DC
  Administered 2014-06-24 – 2014-06-25 (×4): 500 mg via ORAL
  Filled 2014-06-23 (×4): qty 1

## 2014-06-23 MED ORDER — CLONAZEPAM 0.5 MG PO TABS: 1.0000 mg | ORAL_TABLET | Freq: Every day | ORAL | Status: DC | PRN

## 2014-06-23 MED ORDER — SODIUM CHLORIDE 0.9 % IV SOLN
INTRAVENOUS | Status: DC
  Administered 2014-06-23 – 2014-06-24 (×2): via INTRAVENOUS

## 2014-06-23 MED ORDER — ONDANSETRON HCL 4 MG PO TABS: 4.0000 mg | ORAL_TABLET | Freq: Three times a day (TID) | ORAL | Status: DC | PRN

## 2014-06-23 MED ORDER — HEPARIN SODIUM (PORCINE) 5000 UNIT/ML IJ SOLN
5000.0000 [IU] | Freq: Three times a day (TID) | INTRAMUSCULAR | Status: DC
  Administered 2014-06-24 – 2014-06-25 (×5): 5000 [IU] via SUBCUTANEOUS
  Filled 2014-06-23 (×5): qty 1

## 2014-06-23 MED ORDER — BUPRENORPHINE HCL 2 MG SL SUBL
2.0000 mg | SUBLINGUAL_TABLET | Freq: Every day | SUBLINGUAL | Status: DC
  Administered 2014-06-24 – 2014-06-25 (×2): 2 mg via SUBLINGUAL
  Filled 2014-06-23 (×2): qty 1

## 2014-06-23 MED ORDER — DULOXETINE HCL 60 MG PO CPEP
60.0000 mg | ORAL_CAPSULE | Freq: Every day | ORAL | Status: DC
  Administered 2014-06-24 (×2): 60 mg via ORAL
  Filled 2014-06-23 (×2): qty 1

## 2014-06-23 MED ORDER — FUROSEMIDE 40 MG PO TABS
40.0000 mg | ORAL_TABLET | Freq: Every day | ORAL | Status: DC
  Administered 2014-06-24 – 2014-06-25 (×2): 40 mg via ORAL
  Filled 2014-06-23 (×2): qty 1

## 2014-06-23 MED ORDER — SODIUM CHLORIDE 0.9 % IV BOLUS (SEPSIS)
1000.0000 mL | Freq: Once | INTRAVENOUS | Status: AC
  Administered 2014-06-23: 1000 mL via INTRAVENOUS

## 2014-06-23 NOTE — ED Notes (Signed)
Unable to obtain IV access and labs - notified [name] - will have another RN attempt with U/S
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
## 2014-06-23 NOTE — ED Notes (Signed)
Pt outside smoking

## 2014-06-23 NOTE — ED Notes (Signed)
Patient transported to X-ray 

## 2014-06-23 NOTE — ED Notes (Signed)
Patient transported to CT 
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
## 2014-06-23 NOTE — ED Notes (Signed)
Pt drowsy but easily roused, MAE, equal strong grips. EOMI. Able to state his name and where he is.  Visitor with pt upset about wait. Says he was sent here by Dr Ouida Sills.

## 2014-06-23 NOTE — ED Notes (Signed)
Pt returned from CT °

## 2014-06-24 ENCOUNTER — Inpatient Hospital Stay (HOSPITAL_COMMUNITY): Payer: Medicare Other

## 2014-06-24 LAB — COMPREHENSIVE METABOLIC PANEL
ALT: 36 U/L (ref 17–63)
AST: 45 U/L — ABNORMAL HIGH (ref 15–41)
Albumin: 3 g/dL — ABNORMAL LOW (ref 3.5–5.0)
Alkaline Phosphatase: 73 U/L (ref 38–126)
Anion gap: 8 (ref 5–15)
BUN: 8 mg/dL (ref 6–20)
CO2: 28 mmol/L (ref 22–32)
Calcium: 8.3 mg/dL — ABNORMAL LOW (ref 8.9–10.3)
Chloride: 103 mmol/L (ref 101–111)
Creatinine, Ser: 0.66 mg/dL (ref 0.61–1.24)
GFR calc Af Amer: >60 mL/min (ref >60)
GFR calc non Af Amer: >60 mL/min (ref >60)
Glucose, Bld: 103 mg/dL — ABNORMAL HIGH (ref 65–99)
Potassium: 3.3 mmol/L — ABNORMAL LOW (ref 3.5–5.1)
Sodium: 139 mmol/L (ref 135–145)
Total Bilirubin: 0.7 mg/dL (ref 0.3–1.2)
Total Protein: 6.1 g/dL — ABNORMAL LOW (ref 6.5–8.1)

## 2014-06-24 LAB — CBC
HCT: 42 % (ref 39.0–52.0)
Hemoglobin: 14 g/dL (ref 13.0–17.0)
MCH: 32.4 pg (ref 26.0–34.0)
MCHC: 33.3 g/dL (ref 30.0–36.0)
MCV: 97.2 fL (ref 78.0–100.0)
Platelets: 210 10*3/uL (ref 150–400)
RBC: 4.32 MIL/uL (ref 4.22–5.81)
RDW: 12.7 % (ref 11.5–15.5)
WBC: 6.3 10*3/uL (ref 4.0–10.5)

## 2014-06-24 LAB — PROTIME-INR
INR: 0.96 (ref 0.00–1.49)
Prothrombin Time: 13 seconds (ref 11.6–15.2)

## 2014-06-24 NOTE — Progress Notes (Signed)
UR chart review completed.  
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
## 2014-06-24 NOTE — Progress Notes (Signed)
Patient Security Password: Teacher, English as a foreign language
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
## 2014-06-25 NOTE — Care Management Note (Signed)
Case Management Note  Patient Details  Name: KEILIN NASH MRN: [id number] Date of Birth: 11-05-1980  Subjective/Objective:                  Pt admitted from home with altered mental status. Pt would not answer questions when CM tried to assess pt for discharge needs.  Action/Plan: Pt discharged home today.  Expected Discharge Date:  06/25/14               Expected Discharge Plan:  Home/Self Care  In-House Referral:  NA  Discharge planning Services  CM Consult  Post Acute Care Choice:  NA Choice offered to:  NA  DME Arranged:    DME Agency:     HH Arranged:    HH Agency:     Status of Service:  Completed, signed off  Medicare Important Message Given:    Date Medicare IM Given:    Medicare IM give by:    Date Additional Medicare IM Given:    Additional Medicare Important Message give by:     If discussed at Long Length of Stay Meetings, dates discussed:    Additional Comments:  Cheryl Flash, RN 06/25/2014, 11:16 AM

## 2014-07-09 DIAGNOSIS — Z79899 Other long term (current) drug therapy: Secondary | ICD-10-CM | POA: Diagnosis not present

## 2014-07-09 DIAGNOSIS — G8929 Other chronic pain: Secondary | ICD-10-CM | POA: Diagnosis not present

## 2014-07-09 DIAGNOSIS — G894 Chronic pain syndrome: Secondary | ICD-10-CM | POA: Diagnosis not present

## 2014-07-09 DIAGNOSIS — M79606 Pain in leg, unspecified: Secondary | ICD-10-CM | POA: Diagnosis not present

## 2014-08-01 ENCOUNTER — Emergency Department (HOSPITAL_COMMUNITY)
Admission: EM | Admit: 2014-08-01 | Discharge: 2014-08-01 | Discharge status: Against Medical Advice | Payer: Medicare Other | Attending: Emergency Medicine | Admitting: Emergency Medicine

## 2014-08-01 ENCOUNTER — Encounter (HOSPITAL_COMMUNITY): Payer: Self-pay | Admitting: *Deleted

## 2014-08-01 DIAGNOSIS — F1010 Alcohol abuse, uncomplicated: Secondary | ICD-10-CM | POA: Diagnosis present

## 2014-08-01 DIAGNOSIS — Z720 Tobacco use: Secondary | ICD-10-CM | POA: Diagnosis not present

## 2014-08-01 LAB — RAPID URINE DRUG SCREEN, HOSP PERFORMED
Amphetamines: NOT DETECTED
Barbiturates: NOT DETECTED
Benzodiazepines: POSITIVE — AB
Cocaine: NOT DETECTED
Opiates: NOT DETECTED
Tetrahydrocannabinol: POSITIVE — AB

## 2014-08-01 MED ORDER — ONDANSETRON 8 MG PO TBDP
ORAL_TABLET | ORAL | Status: AC
  Filled 2014-08-01: qty 1

## 2014-08-01 MED ORDER — ONDANSETRON 8 MG PO TBDP
8.0000 mg | ORAL_TABLET | Freq: Once | ORAL | Status: AC
  Administered 2014-08-01: 8 mg via ORAL

## 2014-08-01 NOTE — ED Notes (Addendum)
Pt states he is here to get off alcohol and is coming off suboxone. Pt's last drink was 15 mins ago. Pt states he will drink anything he can get his hands on, all day everyday.

## 2014-08-01 NOTE — ED Notes (Signed)
Patient states he is here for help w/alcohol cessation.  States he last had ETOH 10 minutes before arriving.  Has had episode of vomiting in waiting room BR.  States not having alcohol causes him to have seizures, visual hallucinations, tremors and vomiting.

## 2014-08-01 NOTE — ED Notes (Signed)
Patient states he wants to leave AMA. Encouraged him to stay for treatment. He states he wants to go to [facility] then states he is just going to go home. Repeatedly encouraged him to stay and be seen by MD, but he refused. Signed out AMA

## 2014-08-02 ENCOUNTER — Encounter (HOSPITAL_COMMUNITY): Payer: Self-pay | Admitting: *Deleted

## 2014-08-02 ENCOUNTER — Emergency Department (HOSPITAL_COMMUNITY)
Admission: EM | Admit: 2014-08-02 | Discharge: 2014-08-04 | Disposition: A | Discharge status: Other Acute Inpatient Hospital | Payer: Medicare Other | Attending: Emergency Medicine | Admitting: Emergency Medicine

## 2014-08-02 DIAGNOSIS — F1210 Cannabis abuse, uncomplicated: Secondary | ICD-10-CM | POA: Diagnosis not present

## 2014-08-02 DIAGNOSIS — F1310 Sedative, hypnotic or anxiolytic abuse, uncomplicated: Secondary | ICD-10-CM | POA: Diagnosis not present

## 2014-08-02 DIAGNOSIS — G40909 Epilepsy, unspecified, not intractable, without status epilepticus: Secondary | ICD-10-CM | POA: Diagnosis not present

## 2014-08-02 DIAGNOSIS — F319 Bipolar disorder, unspecified: Secondary | ICD-10-CM | POA: Insufficient documentation

## 2014-08-02 DIAGNOSIS — Z8719 Personal history of other diseases of the digestive system: Secondary | ICD-10-CM | POA: Diagnosis not present

## 2014-08-02 DIAGNOSIS — Z720 Tobacco use: Secondary | ICD-10-CM | POA: Insufficient documentation

## 2014-08-02 DIAGNOSIS — F1020 Alcohol dependence, uncomplicated: Secondary | ICD-10-CM

## 2014-08-02 DIAGNOSIS — Z8739 Personal history of other diseases of the musculoskeletal system and connective tissue: Secondary | ICD-10-CM | POA: Insufficient documentation

## 2014-08-02 DIAGNOSIS — Z8781 Personal history of (healed) traumatic fracture: Secondary | ICD-10-CM | POA: Insufficient documentation

## 2014-08-02 DIAGNOSIS — F10230 Alcohol dependence with withdrawal, uncomplicated: Secondary | ICD-10-CM | POA: Diagnosis not present

## 2014-08-02 DIAGNOSIS — F10930 Alcohol use, unspecified with withdrawal, uncomplicated: Secondary | ICD-10-CM

## 2014-08-02 DIAGNOSIS — Z79899 Other long term (current) drug therapy: Secondary | ICD-10-CM | POA: Diagnosis not present

## 2014-08-02 DIAGNOSIS — F10129 Alcohol abuse with intoxication, unspecified: Secondary | ICD-10-CM | POA: Diagnosis present

## 2014-08-02 DIAGNOSIS — F10929 Alcohol use, unspecified with intoxication, unspecified: Secondary | ICD-10-CM

## 2014-08-02 DIAGNOSIS — F4310 Post-traumatic stress disorder, unspecified: Secondary | ICD-10-CM | POA: Diagnosis not present

## 2014-08-02 LAB — COMPREHENSIVE METABOLIC PANEL
ALT: 104 U/L — ABNORMAL HIGH (ref 17–63)
AST: 165 U/L — ABNORMAL HIGH (ref 15–41)
Albumin: 4.3 g/dL (ref 3.5–5.0)
Alkaline Phosphatase: 111 U/L (ref 38–126)
Anion gap: 12 (ref 5–15)
BUN: <5 mg/dL — ABNORMAL LOW (ref 6–20)
CO2: 23 mmol/L (ref 22–32)
Calcium: 8.5 mg/dL — ABNORMAL LOW (ref 8.9–10.3)
Chloride: 109 mmol/L (ref 101–111)
Creatinine, Ser: 0.61 mg/dL (ref 0.61–1.24)
GFR calc Af Amer: >60 mL/min (ref >60)
GFR calc non Af Amer: >60 mL/min (ref >60)
Glucose, Bld: 116 mg/dL — ABNORMAL HIGH (ref 65–99)
Potassium: 3.9 mmol/L (ref 3.5–5.1)
Sodium: 144 mmol/L (ref 135–145)
Total Bilirubin: 0.7 mg/dL (ref 0.3–1.2)
Total Protein: 7.8 g/dL (ref 6.5–8.1)

## 2014-08-02 LAB — CBC
HCT: 45.3 % (ref 39.0–52.0)
Hemoglobin: 15 g/dL (ref 13.0–17.0)
MCH: 32 pg (ref 26.0–34.0)
MCHC: 33.1 g/dL (ref 30.0–36.0)
MCV: 96.6 fL (ref 78.0–100.0)
Platelets: 193 10*3/uL (ref 150–400)
RBC: 4.69 MIL/uL (ref 4.22–5.81)
RDW: 13.1 % (ref 11.5–15.5)
WBC: 4.9 10*3/uL (ref 4.0–10.5)

## 2014-08-02 LAB — ETHANOL: Alcohol, Ethyl (B): 388 mg/dL (ref <5)

## 2014-08-02 MED ORDER — FOLIC ACID 1 MG PO TABS
1.0000 mg | ORAL_TABLET | Freq: Every day | ORAL | Status: DC
  Administered 2014-08-03 – 2014-08-04 (×2): 1 mg via ORAL
  Filled 2014-08-02 (×2): qty 1

## 2014-08-02 MED ORDER — THIAMINE HCL 100 MG/ML IJ SOLN
100.0000 mg | Freq: Every day | INTRAMUSCULAR | Status: DC
  Administered 2014-08-03: 100 mg via INTRAVENOUS
  Filled 2014-08-02: qty 2

## 2014-08-02 MED ORDER — ONDANSETRON 8 MG PO TBDP
8.0000 mg | ORAL_TABLET | Freq: Once | ORAL | Status: AC
  Administered 2014-08-02: 8 mg via ORAL
  Filled 2014-08-02: qty 1

## 2014-08-02 MED ORDER — ALUM & MAG HYDROXIDE-SIMETH 200-200-20 MG/5ML PO SUSP: 30.0000 mL | ORAL | Status: DC | PRN

## 2014-08-02 MED ORDER — ADULT MULTIVITAMIN W/MINERALS CH
1.0000 | ORAL_TABLET | Freq: Every day | ORAL | Status: DC
  Administered 2014-08-03 – 2014-08-04 (×2): 1 via ORAL
  Filled 2014-08-02 (×2): qty 1

## 2014-08-02 MED ORDER — NICOTINE 21 MG/24HR TD PT24
21.0000 mg | MEDICATED_PATCH | Freq: Every day | TRANSDERMAL | Status: DC | PRN
  Administered 2014-08-04: 21 mg via TRANSDERMAL
  Filled 2014-08-02: qty 1

## 2014-08-02 MED ORDER — VITAMIN B-1 100 MG PO TABS
100.0000 mg | ORAL_TABLET | Freq: Every day | ORAL | Status: DC
  Administered 2014-08-04: 100 mg via ORAL
  Filled 2014-08-02: qty 1

## 2014-08-02 MED ORDER — ONDANSETRON HCL 4 MG PO TABS
4.0000 mg | ORAL_TABLET | Freq: Three times a day (TID) | ORAL | Status: DC | PRN
  Administered 2014-08-03 – 2014-08-04 (×3): 4 mg via ORAL
  Filled 2014-08-02 (×3): qty 1

## 2014-08-02 MED ORDER — IBUPROFEN 400 MG PO TABS: 600.0000 mg | ORAL_TABLET | Freq: Three times a day (TID) | ORAL | Status: DC | PRN

## 2014-08-02 MED ORDER — NICOTINE 14 MG/24HR TD PT24
14.0000 mg | MEDICATED_PATCH | Freq: Once | TRANSDERMAL | Status: AC
  Administered 2014-08-02: 14 mg via TRANSDERMAL

## 2014-08-02 MED ORDER — NICOTINE 14 MG/24HR TD PT24
MEDICATED_PATCH | TRANSDERMAL | Status: AC
  Filled 2014-08-02: qty 1

## 2014-08-02 MED ORDER — LORAZEPAM 1 MG PO TABS
1.0000 mg | ORAL_TABLET | Freq: Four times a day (QID) | ORAL | Status: DC | PRN
  Administered 2014-08-03 – 2014-08-04 (×3): 1 mg via ORAL
  Filled 2014-08-02 (×3): qty 1

## 2014-08-02 MED ORDER — ACETAMINOPHEN 325 MG PO TABS: 650.0000 mg | ORAL_TABLET | ORAL | Status: DC | PRN

## 2014-08-02 MED ORDER — CHLORDIAZEPOXIDE HCL 25 MG PO CAPS
25.0000 mg | ORAL_CAPSULE | Freq: Once | ORAL | Status: AC
  Administered 2014-08-02: 25 mg via ORAL
  Filled 2014-08-02: qty 1

## 2014-08-02 MED ORDER — ZOLPIDEM TARTRATE 5 MG PO TABS
5.0000 mg | ORAL_TABLET | Freq: Every evening | ORAL | Status: DC | PRN
  Administered 2014-08-03: 5 mg via ORAL
  Filled 2014-08-02: qty 1

## 2014-08-02 MED ORDER — LORAZEPAM 2 MG/ML IJ SOLN
1.0000 mg | Freq: Four times a day (QID) | INTRAMUSCULAR | Status: DC | PRN
  Administered 2014-08-03: 1 mg via INTRAVENOUS
  Filled 2014-08-02: qty 1

## 2014-08-02 NOTE — BH Assessment (Signed)
Received notification of TTS consult request. Spoke to Dr. Mancel Bale who said Pt is requesting alcohol detox. Tele-assessment will be initiated.  Harlin Rain Patsy [place], LPC, [facility], [facility] Triage Specialist [phone]
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
## 2014-08-02 NOTE — ED Notes (Signed)
Patient here for alcohol detox and seeking help to stop ETOH abuse.  Patient was here for same last night and left AMA.

## 2014-08-03 DIAGNOSIS — F10230 Alcohol dependence with withdrawal, uncomplicated: Secondary | ICD-10-CM | POA: Diagnosis not present

## 2014-08-03 MED ORDER — CLONIDINE HCL 0.1 MG PO TABS
0.1000 mg | ORAL_TABLET | Freq: Once | ORAL | Status: AC
  Administered 2014-08-03: 0.1 mg via ORAL
  Filled 2014-08-03: qty 1

## 2014-08-03 MED ORDER — LORAZEPAM 2 MG/ML IJ SOLN
1.0000 mg | Freq: Once | INTRAMUSCULAR | Status: AC
  Administered 2014-08-03: 1 mg via INTRAVENOUS
  Filled 2014-08-03: qty 1

## 2014-08-03 MED ORDER — LEVETIRACETAM 500 MG PO TABS
500.0000 mg | ORAL_TABLET | Freq: Two times a day (BID) | ORAL | Status: DC
  Administered 2014-08-03 – 2014-08-04 (×2): 500 mg via ORAL
  Filled 2014-08-03 (×2): qty 1

## 2014-08-03 MED ORDER — METOPROLOL SUCCINATE ER 50 MG PO TB24
ORAL_TABLET | ORAL | Status: AC
  Filled 2014-08-03: qty 2

## 2014-08-03 MED ORDER — ONDANSETRON HCL 4 MG/2ML IJ SOLN
4.0000 mg | INTRAMUSCULAR | Status: DC | PRN
  Administered 2014-08-03: 4 mg via INTRAVENOUS
  Filled 2014-08-03 (×3): qty 2

## 2014-08-03 MED ORDER — PANTOPRAZOLE SODIUM 40 MG PO TBEC
40.0000 mg | DELAYED_RELEASE_TABLET | Freq: Every day | ORAL | Status: DC
  Administered 2014-08-03 – 2014-08-04 (×2): 40 mg via ORAL
  Filled 2014-08-03 (×2): qty 1

## 2014-08-03 MED ORDER — METOPROLOL SUCCINATE ER 50 MG PO TB24
100.0000 mg | ORAL_TABLET | Freq: Every day | ORAL | Status: DC
  Administered 2014-08-03: 100 mg via ORAL
  Filled 2014-08-03 (×2): qty 1

## 2014-08-03 MED ORDER — FUROSEMIDE 40 MG PO TABS
40.0000 mg | ORAL_TABLET | Freq: Every day | ORAL | Status: DC
  Administered 2014-08-03 – 2014-08-04 (×2): 40 mg via ORAL
  Filled 2014-08-03 (×2): qty 1

## 2014-08-03 MED ORDER — CLONIDINE HCL 0.1 MG PO TABS
0.1000 mg | ORAL_TABLET | Freq: Two times a day (BID) | ORAL | Status: DC
Start: 2014-08-03 — End: 2014-08-04
  Administered 2014-08-03 – 2014-08-04 (×2): 0.1 mg via ORAL
  Filled 2014-08-03 (×2): qty 1

## 2014-08-03 MED ORDER — ONDANSETRON HCL 4 MG/2ML IJ SOLN
4.0000 mg | Freq: Once | INTRAMUSCULAR | Status: AC
  Administered 2014-08-03: 4 mg via INTRAVENOUS
  Filled 2014-08-03: qty 2

## 2014-08-03 MED ORDER — DULOXETINE HCL 30 MG PO CPEP
60.0000 mg | ORAL_CAPSULE | Freq: Every day | ORAL | Status: DC
  Administered 2014-08-03: 60 mg via ORAL
  Filled 2014-08-03: qty 2

## 2014-08-03 MED ORDER — PROMETHAZINE HCL 25 MG/ML IJ SOLN
12.5000 mg | Freq: Once | INTRAMUSCULAR | Status: AC
  Administered 2014-08-03: 12.5 mg via INTRAVENOUS
  Filled 2014-08-03: qty 1

## 2014-08-03 NOTE — ED Notes (Signed)
Telepsych machine in room. 

## 2014-08-03 NOTE — ED Notes (Signed)
Spoke with Rutha Bouchard at [facility]. Joann reports that Dr. Jannifer Franklin wants patient reassessed in the morning and that they are concerned with his high blood pressure. Dr. Effie Shy notified.

## 2014-08-03 NOTE — ED Provider Notes (Signed)
Ford, TSS, states patient was seen yesterday and when he became sober he did not want inpatient admission. Recommends holding patient until morning and reassess if he wants inpatient treatment.     Devoria Albe, MD 08/03/14 [phone]

## 2014-08-03 NOTE — ED Notes (Signed)
Patient still vomiting, pacing room, tremors worse. [name] aware, assessed-orders given.

## 2014-08-03 NOTE — BH Assessment (Signed)
CSW ran pt by Dr. Jannifer Franklin -  pt can be reassessed in the morning to see if he still wants inpatient detox/treatment. Pt accepted inpatient once medically cleared and stable.  Reyes Ivan, LCSW 08/03/2014  10:08 AM

## 2014-08-03 NOTE — ED Provider Notes (Signed)
15:20- updated progress. Last medications were Ativan and Phenergan, at about 9 AM this morning. Vital signs are stable and normal. He is resting comfortably at this time. He states that he feels somewhat tremorous. When extending his hands, he does have mild tremor. He is lucid and cooperative. He states that he still wants to have treatment for alcohol detoxification. CIWA will be repeated, and will discuss the case with TTS.  The CIWA was done and he was treated with Ativan 1 mg  17:30- . He has been accepted [facility] the behavioral health [facility] for further treatment.   Medications  nicotine (NICODERM CQ - dosed in mg/24 hours) patch 14 mg (14 mg Transdermal Patch Applied 08/02/14 2303)  acetaminophen (TYLENOL) tablet 650 mg (not administered)  ibuprofen (ADVIL,MOTRIN) tablet 600 mg (not administered)  zolpidem (AMBIEN) tablet 5 mg (not administered)  nicotine (NICODERM CQ - dosed in mg/24 hours) patch 21 mg (not administered)  ondansetron (ZOFRAN) tablet 4 mg (not administered)  alum & mag hydroxide-simeth (MAALOX/MYLANTA) 200-200-20 MG/5ML suspension 30 mL (not administered)  LORazepam (ATIVAN) tablet 1 mg ( Oral See Alternative 08/03/14 0413)    Or  LORazepam (ATIVAN) injection 1 mg (1 mg Intravenous Given 08/03/14 0413)  thiamine (VITAMIN B-1) tablet 100 mg ( Oral See Alternative 08/03/14 1041)    Or  thiamine (B-1) injection 100 mg (100 mg Intravenous Given 08/03/14 1041)  folic acid (FOLVITE) tablet 1 mg (1 mg Oral Given 08/03/14 1041)  multivitamin with minerals tablet 1 tablet (1 tablet Oral Given 08/03/14 1041)  ondansetron (ZOFRAN) injection 4 mg (4 mg Intravenous Given 08/03/14 0827)  chlordiazePOXIDE (LIBRIUM) capsule 25 mg (25 mg Oral Given 08/02/14 2303)  ondansetron (ZOFRAN-ODT) disintegrating tablet 8 mg (8 mg Oral Given 08/02/14 2302)  ondansetron (ZOFRAN) injection 4 mg (4 mg Intravenous Given 08/03/14 0738)  LORazepam (ATIVAN) injection 1 mg (1 mg Intravenous Given 08/03/14 0827)   LORazepam (ATIVAN) injection 1 mg (1 mg Intravenous Given 08/03/14 0858)  promethazine (PHENERGAN) injection 12.5 mg (12.5 mg Intravenous Given 08/03/14 0858)    Patient Vitals for the past 24 hrs:  BP Temp Temp src Pulse Resp SpO2 Height Weight  08/03/14 1046 119/75 mmHg - - 82 22 100 % - -  08/03/14 0855 147/94 mmHg - - 84 22 98 % - -  08/03/14 0732 128/82 mmHg - - 77 - - - -  08/03/14 0730 128/82 mmHg 98.2 F (36.8 C) Oral 77 18 100 % - -  08/03/14 0407 (!) 133/104 mmHg - - 96 - - - -  08/02/14 2124 161/99 mmHg 98 F (36.7 C) Oral 117 18 93 %  (1.651 m) 200 lb (90.719 kg)      Mancel Bale, MD 08/03/14 1753

## 2014-08-03 NOTE — ED Notes (Signed)
Per [facility] patient is to be accepted once withdrawal symptoms are more controlled, as long as patient continues to want help. Patient will be reassessed in morning by TTS.

## 2014-08-03 NOTE — ED Notes (Signed)
Called behavioral health and was informed that they were concerned with his blood pressure issues  Albeit tthat hs blood pressure has decreased since taking his bp meds

## 2014-08-03 NOTE — ED Provider Notes (Signed)
Determining the patient's exact Suboxone dose, is very difficult. He has received multiple different doses over the last several years. It will be best to contact his provider, to clarify the current dosing prior to reinitiating scheduled treatment.  Mancel Bale, MD 08/03/14 2051
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
## 2014-08-03 NOTE — BH Assessment (Signed)
CSW spoke with Crystal, RN prior to talking with pt.  Crystal states that pt received Ativan around 4 am and will need another dose soon due to pt pacing.  Crystal also states pt recently received Zofran for vomiting.    CSW reassessed pt at this time.  Pt states that he wants help with his alcohol use now.  Pt states that "I'm an alcoholic and tired of living this way".  Pt endorses on and off SI, stating he wants to give up because he is tired of feeling this way.  Pt states that he tries to stop drinking but can't because of how sick he gets, to the point of vomting blood.  Pt states that he is interested in inpatient detox and treatment.  CSW asked pt why he left AMA yesterday if he wants help and pt states that he honestly wanted to drink so he left.  Pt is noticeably shaking during this assessment and appears to be holding back from vomiting.    Reyes Ivan, LCSW 08/03/2014  8:44 AM

## 2014-08-03 NOTE — ED Notes (Signed)
Ashely made aware of change in patient's nausea and blood pressure.

## 2014-08-03 NOTE — ED Notes (Signed)
Per Dereck at [facility] patient to have blood pressure medication and anti-nausea medication and be reevaluated in an hour before being accepted at [facility]. [name] made aware.

## 2014-08-04 ENCOUNTER — Encounter (HOSPITAL_COMMUNITY): Payer: Self-pay | Admitting: *Deleted

## 2014-08-04 ENCOUNTER — Inpatient Hospital Stay (HOSPITAL_COMMUNITY)
Admission: AD | Admit: 2014-08-04 | Discharge: 2014-08-10 | DRG: 897 | Disposition: A | Discharge status: Other Acute Inpatient Hospital | Payer: Medicare Other | Source: Intra-hospital | Attending: Psychiatry | Admitting: Psychiatry

## 2014-08-04 DIAGNOSIS — F1120 Opioid dependence, uncomplicated: Secondary | ICD-10-CM

## 2014-08-04 DIAGNOSIS — G4700 Insomnia, unspecified: Secondary | ICD-10-CM | POA: Diagnosis present

## 2014-08-04 DIAGNOSIS — F10230 Alcohol dependence with withdrawal, uncomplicated: Secondary | ICD-10-CM | POA: Diagnosis not present

## 2014-08-04 DIAGNOSIS — F4310 Post-traumatic stress disorder, unspecified: Secondary | ICD-10-CM | POA: Diagnosis not present

## 2014-08-04 DIAGNOSIS — I10 Essential (primary) hypertension: Secondary | ICD-10-CM | POA: Diagnosis present

## 2014-08-04 DIAGNOSIS — F1020 Alcohol dependence, uncomplicated: Secondary | ICD-10-CM | POA: Diagnosis present

## 2014-08-04 DIAGNOSIS — F17200 Nicotine dependence, unspecified, uncomplicated: Secondary | ICD-10-CM | POA: Diagnosis present

## 2014-08-04 DIAGNOSIS — Y908 Blood alcohol level of 240 mg/100 ml or more: Secondary | ICD-10-CM | POA: Diagnosis present

## 2014-08-04 DIAGNOSIS — F10239 Alcohol dependence with withdrawal, unspecified: Secondary | ICD-10-CM | POA: Diagnosis present

## 2014-08-04 LAB — RAPID URINE DRUG SCREEN, HOSP PERFORMED
Amphetamines: NOT DETECTED
Barbiturates: NOT DETECTED
Benzodiazepines: POSITIVE — AB
Cocaine: NOT DETECTED
Opiates: NOT DETECTED
Tetrahydrocannabinol: POSITIVE — AB

## 2014-08-04 MED ORDER — NICOTINE 21 MG/24HR TD PT24: 21.0000 mg | MEDICATED_PATCH | Freq: Every day | TRANSDERMAL | Status: DC

## 2014-08-04 MED ORDER — NICOTINE POLACRILEX 2 MG MT GUM
2.0000 mg | CHEWING_GUM | OROMUCOSAL | Status: DC | PRN
  Administered 2014-08-04 – 2014-08-07 (×5): 2 mg via ORAL

## 2014-08-04 MED ORDER — ADULT MULTIVITAMIN W/MINERALS CH
1.0000 | ORAL_TABLET | Freq: Every day | ORAL | Status: DC
  Administered 2014-08-04 – 2014-08-10 (×7): 1 via ORAL
  Filled 2014-08-04 (×9): qty 1

## 2014-08-04 MED ORDER — LORAZEPAM 1 MG PO TABS
1.0000 mg | ORAL_TABLET | Freq: Four times a day (QID) | ORAL | Status: AC
  Administered 2014-08-04 (×2): 1 mg via ORAL
  Filled 2014-08-04 (×2): qty 1

## 2014-08-04 MED ORDER — CLONIDINE HCL 0.1 MG PO TABS
0.1000 mg | ORAL_TABLET | Freq: Two times a day (BID) | ORAL | Status: DC
  Administered 2014-08-04 – 2014-08-10 (×12): 0.1 mg via ORAL
  Filled 2014-08-04 (×4): qty 1
  Filled 2014-08-04: qty 28
  Filled 2014-08-04 (×5): qty 1
  Filled 2014-08-04: qty 28
  Filled 2014-08-04 (×4): qty 1
  Filled 2014-08-04 (×2): qty 28
  Filled 2014-08-04: qty 1

## 2014-08-04 MED ORDER — IBUPROFEN 400 MG PO TABS
400.0000 mg | ORAL_TABLET | Freq: Four times a day (QID) | ORAL | Status: DC | PRN
  Administered 2014-08-05 – 2014-08-09 (×8): 400 mg via ORAL
  Filled 2014-08-04 (×8): qty 1

## 2014-08-04 MED ORDER — LEVETIRACETAM ER 500 MG PO TB24
500.0000 mg | ORAL_TABLET | Freq: Two times a day (BID) | ORAL | Status: DC
  Filled 2014-08-04 (×2): qty 1

## 2014-08-04 MED ORDER — LORAZEPAM 1 MG PO TABS
1.0000 mg | ORAL_TABLET | Freq: Three times a day (TID) | ORAL | Status: AC
  Administered 2014-08-05 (×3): 1 mg via ORAL
  Filled 2014-08-04 (×2): qty 1

## 2014-08-04 MED ORDER — BUPRENORPHINE HCL 2 MG SL SUBL
2.0000 mg | SUBLINGUAL_TABLET | Freq: Every day | SUBLINGUAL | Status: DC
  Administered 2014-08-04 – 2014-08-10 (×7): 2 mg via SUBLINGUAL
  Filled 2014-08-04 (×7): qty 1

## 2014-08-04 MED ORDER — LOPERAMIDE HCL 2 MG PO CAPS
ORAL_CAPSULE | ORAL | Status: AC
  Administered 2014-08-04: 2 mg
  Filled 2014-08-04: qty 2

## 2014-08-04 MED ORDER — ALUM & MAG HYDROXIDE-SIMETH 200-200-20 MG/5ML PO SUSP: 30.0000 mL | ORAL | Status: DC | PRN

## 2014-08-04 MED ORDER — TRAZODONE HCL 50 MG PO TABS
50.0000 mg | ORAL_TABLET | Freq: Every evening | ORAL | Status: DC | PRN
  Administered 2014-08-04 – 2014-08-09 (×6): 50 mg via ORAL
  Filled 2014-08-04 (×2): qty 1
  Filled 2014-08-04: qty 14
  Filled 2014-08-04 (×4): qty 1

## 2014-08-04 MED ORDER — MAGNESIUM HYDROXIDE 400 MG/5ML PO SUSP: 30.0000 mL | Freq: Every day | ORAL | Status: DC | PRN

## 2014-08-04 MED ORDER — PANTOPRAZOLE SODIUM 40 MG PO TBEC
40.0000 mg | DELAYED_RELEASE_TABLET | Freq: Every day | ORAL | Status: DC
  Administered 2014-08-04 – 2014-08-10 (×7): 40 mg via ORAL
  Filled 2014-08-04 (×5): qty 1
  Filled 2014-08-04 (×2): qty 14
  Filled 2014-08-04 (×4): qty 1

## 2014-08-04 MED ORDER — VITAMIN B-1 100 MG PO TABS
100.0000 mg | ORAL_TABLET | Freq: Every day | ORAL | Status: DC
  Administered 2014-08-05 – 2014-08-10 (×6): 100 mg via ORAL
  Filled 2014-08-04 (×8): qty 1

## 2014-08-04 MED ORDER — ADULT MULTIVITAMIN W/MINERALS CH
ORAL_TABLET | ORAL | Status: AC
  Filled 2014-08-04: qty 1

## 2014-08-04 MED ORDER — ONDANSETRON 4 MG PO TBDP
ORAL_TABLET | ORAL | Status: AC
  Administered 2014-08-04: 4 mg
  Filled 2014-08-04: qty 1

## 2014-08-04 MED ORDER — ONDANSETRON 4 MG PO TBDP
4.0000 mg | ORAL_TABLET | Freq: Four times a day (QID) | ORAL | Status: AC | PRN
  Administered 2014-08-04: 4 mg via ORAL

## 2014-08-04 MED ORDER — LORAZEPAM 1 MG PO TABS
1.0000 mg | ORAL_TABLET | Freq: Every day | ORAL | Status: AC
  Administered 2014-08-07: 1 mg via ORAL
  Filled 2014-08-04: qty 1

## 2014-08-04 MED ORDER — LORAZEPAM 1 MG PO TABS
1.0000 mg | ORAL_TABLET | Freq: Two times a day (BID) | ORAL | Status: AC
  Administered 2014-08-06 (×2): 1 mg via ORAL
  Filled 2014-08-04 (×2): qty 1

## 2014-08-04 MED ORDER — ACETAMINOPHEN 325 MG PO TABS
650.0000 mg | ORAL_TABLET | Freq: Four times a day (QID) | ORAL | Status: DC | PRN
  Administered 2014-08-04: 650 mg via ORAL
  Filled 2014-08-04: qty 2

## 2014-08-04 MED ORDER — THIAMINE HCL 100 MG/ML IJ SOLN: 100.0000 mg | Freq: Once | INTRAMUSCULAR | Status: DC

## 2014-08-04 MED ORDER — DULOXETINE HCL 60 MG PO CPEP
60.0000 mg | ORAL_CAPSULE | Freq: Every day | ORAL | Status: DC
  Administered 2014-08-04 – 2014-08-09 (×6): 60 mg via ORAL
  Filled 2014-08-04: qty 1
  Filled 2014-08-04: qty 14
  Filled 2014-08-04 (×5): qty 1
  Filled 2014-08-04: qty 14
  Filled 2014-08-04: qty 1

## 2014-08-04 MED ORDER — HYDROXYZINE HCL 25 MG PO TABS
25.0000 mg | ORAL_TABLET | Freq: Four times a day (QID) | ORAL | Status: AC | PRN
  Filled 2014-08-04 (×3): qty 1

## 2014-08-04 MED ORDER — LORAZEPAM 1 MG PO TABS
1.0000 mg | ORAL_TABLET | Freq: Four times a day (QID) | ORAL | Status: AC | PRN
  Administered 2014-08-06: 1 mg via ORAL
  Filled 2014-08-04 (×2): qty 1

## 2014-08-04 MED ORDER — LORAZEPAM 1 MG PO TABS
ORAL_TABLET | ORAL | Status: AC
Start: 2014-08-04 — End: 2014-08-04
  Administered 2014-08-04: 1 mg
  Filled 2014-08-04: qty 1

## 2014-08-04 MED ORDER — LEVETIRACETAM 500 MG PO TABS
500.0000 mg | ORAL_TABLET | Freq: Two times a day (BID) | ORAL | Status: DC
  Administered 2014-08-04 – 2014-08-10 (×12): 500 mg via ORAL
  Filled 2014-08-04 (×8): qty 1
  Filled 2014-08-04: qty 28
  Filled 2014-08-04: qty 1
  Filled 2014-08-04: qty 28
  Filled 2014-08-04: qty 1
  Filled 2014-08-04: qty 28
  Filled 2014-08-04 (×4): qty 1
  Filled 2014-08-04: qty 28

## 2014-08-04 MED ORDER — LOPERAMIDE HCL 2 MG PO CAPS: 2.0000 mg | ORAL_CAPSULE | ORAL | Status: AC | PRN

## 2014-08-04 NOTE — Tx Team (Addendum)
Initial Interdisciplinary Treatment Plan   PATIENT STRESSORS: Legal issue Substance abuse  Health problems   PATIENT STRENGTHS: Ability for insight Average or above average intelligence Communication skills General fund of knowledge Motivation for treatment/growth Supportive family/friends   PROBLEM LIST: Problem List/Patient Goals Date to be addressed Date deferred Reason deferred Estimated date of resolution  "I want to detox." 08/04/14           "I want to learn more about my disease." 08/04/14           Substance abuse 08/04/14     Suicide risk  08/04/14                        DISCHARGE CRITERIA:  Improved stabilization in mood, thinking, and/or behavior Motivation to continue treatment in a less acute level of care Need for constant or close observation no longer present Reduction of life-threatening or endangering symptoms to within safe limits Verbal commitment to aftercare and medication compliance Withdrawal symptoms are absent or subacute and managed without 24-hour nursing intervention  PRELIMINARY DISCHARGE PLAN: Attend 12-step recovery group Outpatient therapy Return to previous living arrangement  PATIENT/FAMIILY INVOLVEMENT: This treatment plan has been presented to and reviewed with the patient, Dean [name], and/or family member.  The patient and family have been given the opportunity to ask questions and make suggestions.  Merian Capron [facility] 08/04/2014, 12:54 PM

## 2014-08-04 NOTE — ED Notes (Signed)
Patient up to take a shower and shave with alfred at side
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
## 2014-08-04 NOTE — ED Notes (Signed)
[facility] arrived and is at bedside.
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
## 2014-08-04 NOTE — ED Notes (Signed)
Waiting on [facility] to transport.

## 2014-08-04 NOTE — [facility] Group Notes (Signed)
[facility] LCSW Group Therapy 08/04/2014  1:15 PM   Type of Therapy: Group Therapy  Participation Level: Did Not Attend. Patient invited to participate but declined.   Avinash Maltos, MSW, LCSWA Clinical Social Worker [facility] [phone]   

## 2014-08-04 NOTE — ED Notes (Signed)
[facility]1 to Dr Dub Mikes. Dr Estell Harpin and primary RN notified.

## 2014-08-04 NOTE — ED Notes (Signed)
Attempted to call report to [facility]. Secretary states RN Maralyn Sago is unavailable for report at this time. States to call back in 15 minutes.

## 2014-08-04 NOTE — Progress Notes (Signed)
D: Patient in the hallway on approach.  Patient states he is still adjusting to the unit. Patient states he continues to have anxiety and depression.  Patient states his goal is to remain in a safe place for now and to get into a Long-term treatment facility.  Patient denies SI/HI and denies AVH.    A: Staff to monitor Q 15 mins for safety.  Encouragement and support offered.  Scheduled medications administered per orders.  Tylenol administered prn for back pain.  Nicotine gum administered prn.  Trazodone administered prn for pain R: Patient remains safe on the unit.  Patient attended group tonight.  Patient visible on the unit and interacting with peers.  Patient taking administered medications.

## 2014-08-04 NOTE — Progress Notes (Signed)
Pt did attend the evening AA speaker meeting.

## 2014-08-04 NOTE — ED Notes (Signed)
Pt has been accepted to [facility] under [facility] [place]. Rm #301. [facility] is ready for him at this time.
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
## 2014-08-05 MED ORDER — TUBERCULIN PPD 5 UNIT/0.1ML ID SOLN
5.0000 [IU] | Freq: Once | INTRADERMAL | Status: AC
  Administered 2014-08-05: 5 [IU] via INTRADERMAL

## 2014-08-05 NOTE — Progress Notes (Signed)
D: Lemon has been cooperative and compliant with medications. He denied SI/HI/AVH. He has complained of pain in his back, ribs, and knee. He indicated he wanted to learn more about alcoholism today. He rated his depression a 5, hopelessness a 5, and anxiety a 7.  A: Meds given as ordered, including Subutex for pain and PRN nicotine gum for cravings. PPD placed in left forearm. Q15 safety checks maintained. Support/encouragement offered. R: Pt remains free from harm and continues with treatment. Will continue to monitor for needs/safety.

## 2014-08-05 NOTE — Clinical Social Work Note (Signed)
Referral made to [facility].  Samuella Bruin, MSW, [employer] Clinical Social Worker [facility] [phone]

## 2014-08-05 NOTE — Progress Notes (Signed)
Patient ID: Dean [name], male   DOB: [birth year], 34 y.o.   MRN: [id number] PER STATE REGULATIONS 482.30  THIS CHART WAS REVIEWED FOR MEDICAL NECESSITY WITH RESPECT TO THE PATIENT'S ADMISSION/DURATION OF STAY.  NEXT REVIEW DATE:08/08/14  Loura Halt, RN, BSN CASE MANAGER

## 2014-08-05 NOTE — [facility] Suicide Risk Assessment (Signed)
[facility] INPATIENT:  Family/Significant Other Suicide Prevention Education  Suicide Prevention Education:  Contact Attempts: Mother Richardine Service [phone], (name of family member/significant other) has been identified by the patient as the family member/significant other with whom the patient will be residing, and identified as the person(s) who will aid the patient in the event of a mental health crisis.  With written consent from the patient, two attempts were made to provide suicide prevention education, prior to and/or following the patient's discharge.  We were unsuccessful in providing suicide prevention education.  A suicide education pamphlet was given to the patient to share with family/significant other.  Date and time of first attempt: 08/05/14 at 10am Date and time of second attempt: 08/06/14 at 10am  Zoejane Gaulin, Belenda Cruise L 08/05/2014, 10:05 AM

## 2014-08-05 NOTE — [facility] Group Notes (Signed)
[facility] LCSW Group Therapy 08/05/2014  1:15 PM   Type of Therapy: Group Therapy  Participation Level: Did Not Attend. Patient invited to participate but declined.   Merrick Feutz, MSW, LCSWA Clinical Social Worker [facility] [phone]   

## 2014-08-05 NOTE — Tx Team (Signed)
Interdisciplinary Treatment Plan Update (Adult) Date: 08/05/2014    Time Reviewed: 9:30 AM  Progress in Treatment: Attending groups: Continuing to assess, patient new to milieu Participating in groups: Continuing to assess, patient new to milieu Taking medication as prescribed: Yes Tolerating medication: Yes Family/Significant other contact made: No, CSW assessing for appropriate contacts Patient understands diagnosis: Yes Discussing patient identified problems/goals with staff: Yes Medical problems stabilized or resolved: Yes Denies suicidal/homicidal ideation: Yes Issues/concerns per patient self-inventory: Yes Other:  New problem(s) identified: N/A  Discharge Plan or Barriers: 08/05/2014:  CSW continuing to assess, patient new to milieu.  Reason for Continuation of Hospitalization:  Depression Anxiety Medication Stabilization   Comments: N/A  Estimated length of stay: 2-3 days   Patient is a 34 year old Caucasian Male admitted for ETOH abuse. Patient lives in [place] alone and identified his mother, brother, and AA contacts as supports. Patient requesting referrals to [facility], and is considering an [facility]. Patient will benefit from crisis stabilization, medication evaluation, group therapy, and psycho education in addition to case management for discharge planning. Patient and CSW reviewed pt's identified goals and treatment plan. Pt verbalized understanding and agreed to treatment plan.    Review of initial/current patient goals per problem list:  1. Goal(s): Patient will participate in aftercare plan   Met: No   Target date: 2-3 days   As evidenced by: Patient will participate within aftercare plan AEB aftercare provider and housing plan at discharge being identified.  7/26: Requesting residential treatment, referrals pending at [facility].    2. Goal (s): Patient will exhibit decreased depressive symptoms and suicidal  ideations.   Met: No   Target date: 2-3 days   As evidenced by: Patient will utilize self rating of depression at 3 or below and demonstrate decreased signs of depression or be deemed stable for discharge by MD.  7/26: Goal not met: Pt presents with flat affect and depressed mood.  Pt admitted with depression rating of 10.  Pt to show decreased sign of depression and a rating of 3 or less before d/c.      3. Goal(s): Patient will demonstrate decreased signs and symptoms of anxiety.   Met: No   Target date: 2-3 days   As evidenced by: Patient will utilize self rating of anxiety at 3 or below and demonstrated decreased signs of anxiety, or be deemed stable for discharge by MD  7/26: Goal not met: Pt presents with anxious mood and affect.  Pt admitted with anxiety rating of 10.  Pt to show decreased sign of anxiety and a rating of 3 or less before d/c.    4. Goal(s): Patient will demonstrate decreased signs of withdrawal due to substance abuse   Met: No   Target date: 2-3 days   As evidenced by: Patient will produce a CIWA/COWS score of 0, have stable vitals signs, and no symptoms of withdrawal  7/26. Goal not met: Pt continues to have withdrawal symptoms of anxiety and agitation and a CIWA score of a 3 today.  Pt to show decrease withdrawal symptoms prior to d/c.    Attendees: Patient:    Family:    Physician: Dr. Parke Poisson; Dr. Sabra Heck 08/05/2014 9:30 AM  Nursing: Kerby Nora RN 08/05/2014 9:30 AM  Clinical Social Worker: Erasmo Downer Nickalas Mccarrick,  [place] 08/05/2014 9:30 AM  Other: Peri Maris, LCSWA 08/05/2014 9:30 AM  Other: Lucinda Dell, Beverly Sessions Liaison 08/05/2014 9:30 AM  Other:  Lars Pinks, RN CM 08/05/2014  9:30 AM  Other: Ave Filter, NP 08/05/2014 9:30 AM  Other:       Scribe for Treatment Team:  Tilden Fossa, MSW, [place]

## 2014-08-05 NOTE — Progress Notes (Signed)
D: Patient in bed all shift.  Patient did wake up long enough to talk to Clinical research associate and to take mediations.  Patient states he continues to have high anxiety.  Patient states he was also tired tonight because he did not sleep well the previous night.  Patient appears anxious.  Patient denies SI/HI and denies AVH.   A: Staff to monitor Q 15 mins for safety.  Encouragement and support offered.  Scheduled medications administered per orders. R: Patient remains safe on the unit.  Patient did not attend group tonight.  Patient not visible on the unit tonight.  Patient taking administered medications.

## 2014-08-05 NOTE — Progress Notes (Signed)
Recreation Therapy Notes  Animal-Assisted Activity (AAA) Program Checklist/Progress Notes Patient Eligibility Criteria Checklist & Daily Group note for Rec Tx Intervention  Date: 07.26.16 Time: 2:45 pm Location: [address] Dayroom  AAA/T Program Assumption of Risk Form signed by Patient/ or Parent Legal Guardian yes  Patient is free of allergies or sever asthma yes  Patient reports no fear of animals yes  Patient reports no history of cruelty to animalsyes  Patient understands his/her participation is voluntary yes  Patient washes hands before animal contact yes  Patient washes hands after animal contact yes  Education: Hand Washing, Appropriate Animal Interaction   Education Outcome: Acknowledges understanding/In group clarification offered/Needs additional education.   Clinical Observations/Feedback:  Patient did not attend group.   Dean [name], LRT/CTRS         Dean [name] 08/05/2014 4:09 PM 

## 2014-08-05 NOTE — Clinical Social Work Note (Signed)
Referral faxed to [facility].   Tim Wilhide, MSW, LCSWA Clinical Social Worker  [facility] [phone]  

## 2014-08-05 NOTE — Progress Notes (Signed)
PPD placed in left forearm at 1309. Area circled w/pen. Pt tolerated well.
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
## 2014-08-06 MED ORDER — METHYLPREDNISOLONE 4 MG PO TBPK
4.0000 mg | ORAL_TABLET | Freq: Four times a day (QID) | ORAL | Status: DC
  Administered 2014-08-08 – 2014-08-10 (×8): 4 mg via ORAL

## 2014-08-06 MED ORDER — METHYLPREDNISOLONE 4 MG PO TBPK
4.0000 mg | ORAL_TABLET | Freq: Three times a day (TID) | ORAL | Status: AC
  Administered 2014-08-07 (×3): 4 mg via ORAL

## 2014-08-06 MED ORDER — METHYLPREDNISOLONE 4 MG PO TBPK
8.0000 mg | ORAL_TABLET | Freq: Every morning | ORAL | Status: AC
Start: 2014-08-06 — End: 2014-08-06
  Administered 2014-08-06: 8 mg via ORAL
  Filled 2014-08-06: qty 21

## 2014-08-06 MED ORDER — METHYLPREDNISOLONE 4 MG PO TBPK
4.0000 mg | ORAL_TABLET | ORAL | Status: AC
  Administered 2014-08-06: 4 mg via ORAL

## 2014-08-06 MED ORDER — METHYLPREDNISOLONE 4 MG PO TBPK
8.0000 mg | ORAL_TABLET | Freq: Every evening | ORAL | Status: AC
  Administered 2014-08-07: 8 mg via ORAL

## 2014-08-06 MED ORDER — METHYLPREDNISOLONE 4 MG PO TBPK
8.0000 mg | ORAL_TABLET | Freq: Every evening | ORAL | Status: AC
  Administered 2014-08-06: 8 mg via ORAL
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
## 2014-08-06 NOTE — Progress Notes (Signed)
D:Patient in the bed on approach today.  Patient has been in bed all evening.  Patient states he is getting some relief for his back pain.  Patiet appears flat and depressed.  Patient does not appear vested in treatment.  Patient refused to get up tonight to go to group. Patient states he did ot have a goal for today. Patient denies SI/HI and denies AVH. A: Staff to monitor Q 15 mins for safety.  Encouragement and support offered.  Scheduled medications administered per orders. Ibuprofen administered prn for pain. R: Patient remains safe on the unit.  Patient did not attend group tonight.  Patient not visible on the unit.  Patient taking administered medications

## 2014-08-06 NOTE — Progress Notes (Signed)
Pt was invited to group, however he stayed in his room and slept.

## 2014-08-06 NOTE — [facility] Group Notes (Signed)
[facility] LCSW Group Therapy 08/06/2014  1:15 PM Type of Therapy: Group Therapy Participation Level: Active  Participation Quality: Attentive, Sharing and Supportive  Affect: Appropriate  Cognitive: Alert and Oriented  Insight: Developing/Improving and Engaged  Engagement in Therapy: Developing/Improving and Engaged  Modes of Intervention: Clarification, Confrontation, Discussion, Education, Exploration, Limit-setting, Orientation, Problem-solving, Rapport Building, Dance movement psychotherapist, Socialization and Support  Summary of Progress/Problems: The topic for group today was emotional regulation. This group focused on both positive and negative emotion identification and allowed group members to process ways to identify feelings, regulate negative emotions, and find healthy ways to manage internal/external emotions. Group members were asked to reflect on a time when their reaction to an emotion led to a negative outcome and explored how alternative responses using emotion regulation would have benefited them. Group members were also asked to discuss a time when emotion regulation was utilized when a negative emotion was experienced. 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 Patient discussed the role of support and talking about his emotions in recovery. He identified several family members and AA group as supports.  Samuella Bruin, MSW, [employer] Clinical Social Worker [facility] [phone]

## 2014-08-06 NOTE — Progress Notes (Signed)
Patient up and visible in milieu. Social with peers. Remains anxious in mood and affect. Complaining frequently of withdrawal symptoms and chronic back pain of a 10/10. Continues to state he is unsure if he can proceed through his detox when he is experiencing this much pain. Patient medicated per orders. Ibuprofen given and orders received to begin steroids for pain. Ativan prn given after lunch. Patient supported, reassured. Will reassess pain at 1 hour. He denies SI/HI and remains safe. Lawrence Marseilles

## 2014-08-06 NOTE — Plan of Care (Signed)
Problem: Ineffective individual coping Goal: STG: Patient will remain free from self harm Outcome: Progressing Patient has not engaged in self harm. Denies SI  Problem: Alteration in mood & ability to function due to Goal: STG-Patient will report withdrawal symptoms Outcome: Progressing Patient complaining of withdrawal - continuing with CIWAs

## 2014-08-06 NOTE — [facility] Group Notes (Signed)
   [facility] LCSW Aftercare Discharge Planning Group Note  08/06/2014  8:45 AM   Participation Quality: Alert, Appropriate and Oriented  Mood/Affect: Anxious  Depression Rating: 0  Anxiety Rating: 9  Thoughts of Suicide: Pt denies SI/HI  Will you contract for safety? Yes  Current AVH: Pt denies  Plan for Discharge/Comments: Pt attended discharge planning group and actively participated in group. CSW provided pt with today's workbook. Patient unclear about his plan at discharge but continues to be interested in residential treatment. Referrals pending at [facility] house and [facility]. Patient focused on pain issues and lack of sleep.  Transportation Means: Pt reports access to transportation  Supports: No supports mentioned at this time  Samuella Bruin, MSW, [employer] Clinical Social Worker [employer] [phone]

## 2014-08-06 NOTE — Progress Notes (Signed)
Recreation Therapy Notes  Date: 07.27.16 Time: 9:30 am Location: [address] Group Room  Group Topic: Stress Management  Goal Area(s) Addresses:  Patient will verbalize importance of using healthy stress management.  Patient will identify positive emotions associated with healthy stress management.   Intervention: Stress Management  Activity :  Guided Training and development officer.  LRT introduced the technique of guided imagery.  A script was used to deliver the technique to the patients.  Patients were asked to follow the script read a loud by LRT to engage in the technique of guided imagery.  Education:  Stress Management, Discharge Planning.   Education Outcome: Acknowledges edcuation/In group clarification offered/Needs additional education  Clinical Observations/Feedback: Patient did not attend group.   Caroll Rancher, LRT/CTRS         Caroll Rancher A 08/06/2014 12:15 PM

## 2014-08-07 LAB — HEPATITIS PANEL, ACUTE
HCV Ab: <0.1 s/co ratio (ref 0.0–0.9)
Hep A IgM: NEGATIVE
Hep B C IgM: NEGATIVE
Hepatitis B Surface Ag: NEGATIVE

## 2014-08-07 MED ORDER — POTASSIUM CHLORIDE CRYS ER 20 MEQ PO TBCR
20.0000 meq | EXTENDED_RELEASE_TABLET | Freq: Two times a day (BID) | ORAL | Status: DC
  Administered 2014-08-07 – 2014-08-10 (×6): 20 meq via ORAL
  Filled 2014-08-07 (×2): qty 1
  Filled 2014-08-07: qty 28
  Filled 2014-08-07 (×4): qty 1
  Filled 2014-08-07: qty 28
  Filled 2014-08-07: qty 1
  Filled 2014-08-07: qty 28
  Filled 2014-08-07: qty 1
  Filled 2014-08-07: qty 28
  Filled 2014-08-07 (×2): qty 1

## 2014-08-07 MED ORDER — METOPROLOL SUCCINATE ER 100 MG PO TB24
100.0000 mg | ORAL_TABLET | Freq: Every day | ORAL | Status: DC
  Administered 2014-08-07 – 2014-08-10 (×4): 100 mg via ORAL
  Filled 2014-08-07: qty 1
  Filled 2014-08-07: qty 14
  Filled 2014-08-07 (×2): qty 1
  Filled 2014-08-07: qty 14
  Filled 2014-08-07 (×2): qty 1

## 2014-08-07 MED ORDER — FUROSEMIDE 40 MG PO TABS
40.0000 mg | ORAL_TABLET | Freq: Every day | ORAL | Status: DC
  Administered 2014-08-07 – 2014-08-10 (×4): 40 mg via ORAL
  Filled 2014-08-07: qty 14
  Filled 2014-08-07 (×2): qty 1
  Filled 2014-08-07: qty 14
  Filled 2014-08-07: qty 1
  Filled 2014-08-07: qty 2
  Filled 2014-08-07 (×2): qty 1

## 2014-08-07 MED ORDER — METOPROLOL SUCCINATE ER 50 MG PO TB24
ORAL_TABLET | ORAL | Status: AC
  Filled 2014-08-07: qty 2

## 2014-08-07 MED ORDER — HYDROXYZINE HCL 50 MG PO TABS
50.0000 mg | ORAL_TABLET | Freq: Once | ORAL | Status: AC
  Administered 2014-08-07: 50 mg via ORAL

## 2014-08-07 MED ORDER — HYDROXYZINE HCL 25 MG PO TABS
25.0000 mg | ORAL_TABLET | Freq: Four times a day (QID) | ORAL | Status: DC | PRN
  Administered 2014-08-07 – 2014-08-09 (×5): 25 mg via ORAL
  Filled 2014-08-07 (×3): qty 1
  Filled 2014-08-07: qty 30
  Filled 2014-08-07: qty 1

## 2014-08-07 MED ORDER — HYDROXYZINE HCL 50 MG PO TABS
ORAL_TABLET | ORAL | Status: AC
  Filled 2014-08-07: qty 1

## 2014-08-07 MED ORDER — ACAMPROSATE CALCIUM 333 MG PO TBEC
666.0000 mg | DELAYED_RELEASE_TABLET | Freq: Three times a day (TID) | ORAL | Status: DC
  Administered 2014-08-07 – 2014-08-10 (×9): 666 mg via ORAL
  Filled 2014-08-07 (×5): qty 2
  Filled 2014-08-07: qty 84
  Filled 2014-08-07 (×2): qty 2
  Filled 2014-08-07: qty 84
  Filled 2014-08-07: qty 2
  Filled 2014-08-07 (×2): qty 84
  Filled 2014-08-07: qty 2
  Filled 2014-08-07: qty 84
  Filled 2014-08-07 (×2): qty 2
  Filled 2014-08-07: qty 84
  Filled 2014-08-07: qty 2
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
## 2014-08-07 NOTE — Clinical Social Work Note (Signed)
CSW spoke w Lawanna Kobus at [facility], Lawanna Kobus will work w patient re admission fee, Tb test faxed to facility.  [facility] wants to know when he will be discharged, [facility] will pick up patient at [facility] ED at 9 AM on Monday.    Santa Genera, LCSW Clinical Social Worker
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
## 2014-08-07 NOTE — Progress Notes (Signed)
Patient up and visible in milieu. Visibly anxious, agitated with some irritability noted. Patient states the prednisone is helping slightly with his chronic upper back pain however it remains at a 9/10. Patient asking for more ativan "or something for anxiety." Rates depression at an 8/10, hopelessness at a 6/10 and anxiety at a 7/10. He reports his goal for today is to 'keep calm" and he plans to "take meds and breathe" in order to accomplish this. Patient given support, encouragement. Fall precautions in place and reviewed with patient (patient high fall risk due to seizure d/o). Spoke with Dr. Dub Mikes regarding patient's request for a prn. Orders received to renew vistaril  po q6h prn. Will inform patient when he returns from lunch and medicate as needed. He denies SI/HI and remains safe. Dean [name]
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
## 2014-08-07 NOTE — Plan of Care (Signed)
Problem: Alteration in mood & ability to function due to Goal: STG: Patient verbalizes decreases in signs of withdrawal Outcome: Progressing Patient reports this morning that he is experiencing less withdrawal. Primarily reports shakiness. Goal: STG-Patient will comply with prescribed medication regimen (Patient will comply with prescribed medication regimen)  Outcome: Progressing Patient has been med compliant.

## 2014-08-07 NOTE — Clinical Social Work Note (Signed)
[facility] has declined admission to patient, [facility] will do phone screen today at 5 PM, patient advised to call.  Santa Genera, LCSW Clinical Social Worker

## 2014-08-07 NOTE — Progress Notes (Signed)
Patient's PPD read on L forearm and is negative. Lawrence Marseilles, RN
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
## 2014-08-07 NOTE — Tx Team (Addendum)
Interdisciplinary Treatment Plan Update (Adult) Date: 08/07/2014    Time Reviewed: 9:30 AM  Progress in Treatment: Attending groups: Yes Participating in groups: Yes Taking medication as prescribed: Yes Tolerating medication: Yes Family/Significant other contact made: No, CSW attempted to contact collateral x2 Patient understands diagnosis: Yes Discussing patient identified problems/goals with staff: Yes Medical problems stabilized or resolved: Yes Denies suicidal/homicidal ideation: Yes Issues/concerns per patient self-inventory: Yes Other:  New problem(s) identified: N/A  Discharge Plan or Barriers: 08/07/2014:  CSW seeking residential SA Tx at discharge  Reason for Continuation of Hospitalization:  Depression Anxiety Medication Stabilization   Comments: N/A  Estimated length of stay: 2-3 days   Patient is a 34 year old Caucasian Male admitted for ETOH abuse. Patient lives in [place] alone and identified his mother, brother, and AA contacts as supports. Patient requesting referrals to [facility], and is considering an [facility]. Patient will benefit from crisis stabilization, medication evaluation, group therapy, and psycho education in addition to case management for discharge planning. Patient and CSW reviewed pt's identified goals and treatment plan. Pt verbalized understanding and agreed to treatment plan.    Review of initial/current patient goals per problem list:  1. Goal(s): Patient will participate in aftercare plan   Met:Yes   Target date: 2-3 days   As evidenced by: Patient will participate within aftercare plan AEB aftercare provider and housing plan at discharge being identified.  7/26: Requesting residential treatment, referrals pending at [facility]. 7/28:  Referrals made to [facility] and [facility], [facility] may be able to admit on Monday, CSW will follow up w [facility] 7/29:  Accepted at [facility] for Sunday the 31st   2. Goal (s):  Patient will exhibit decreased depressive symptoms and suicidal ideations.   Met: No   Target date: 2-3 days   As evidenced by: Patient will utilize self rating of depression at 3 or below and demonstrate decreased signs of depression or be deemed stable for discharge by MD.  7/26: Goal not met: Pt presents with flat affect and depressed mood.  Pt admitted with depression rating of 10.  Pt to show decreased sign of depression and a rating of 3 or less before d/c.   7.28:  Goal progressing.  Patient rates depression at 8, some decreased signs/sx of depression    3. Goal(s): Patient will demonstrate decreased signs and symptoms of anxiety.   Met: No   Target date: 2-3 days   As evidenced by: Patient will utilize self rating of anxiety at 3 or below and demonstrated decreased signs of anxiety, or be deemed stable for discharge by MD  7/26: Goal not met: Pt presents with anxious mood and affect.  Pt admitted with anxiety rating of 10.  Pt to show decreased sign of anxiety and a rating of 3 or less before d/c.  7/28:  Goal progressing:  Pt rates anxiety at 7, decreased signs/sx of anxiety, continues to withdraw   4. Goal(s): Patient will demonstrate decreased signs of withdrawal due to substance abuse   Met: No   Target date: 2-3 days   As evidenced by: Patient will produce a CIWA/COWS score of 0, have stable vitals signs, and no symptoms of withdrawal  7/26. Goal not met: Pt continues to have withdrawal symptoms of anxiety and agitation and a CIWA score of a 3 today.  Pt to show decrease withdrawal symptoms prior to d/c.  7/28:  Goal progressing, patient reports tremors, diarrhea, cramps, agitation, irritability, chills.  CIWA -  7; COWS - 11; high blood pressure.    Attendees:   Signature: Gypsy Balsam MD 08/07/2014 12:03 PM   Signature: Eusebio Me, LCSW 08/07/2014 12:03 PM  Signature: Jinny Sanders RN; Maudie Mercury, RN; Sheldon, RN 08/07/2014 12:03 PM  Signature: Oswaldo Milian, [place]  08/07/2014 12:03 PM  Signature: Addison Naegeli, RN, UR 08/07/2014 12:03 PM  Signature: Sonda Primes TCT 08/07/2014 12:03 PM  Signature:  08/07/2014 12:03 PM  Signature:  08/07/2014 12:03 PM  Signature:  08/07/2014 12:03 PM  Signature:  08/07/2014 12:03 PM  Signature:   Signature:   Signature:    Scribe for Treatment Team:   Edwyna Shell, LCSW Clinical Social Worker 08/07/2014 12:06 PM

## 2014-08-07 NOTE — Progress Notes (Addendum)
VS taken at 1720 (see doc flowsheets) which are quite elevated. Patient observed on TC completing interview with [facility]. Patient becoming extremely angry and agitated due to interviewer and her questioning. Manual pressure obtained and confirms elevation. A Nwoko, NP notified and orders received. Will medicate per orders and recheck. Patient denies SOB, chest pain.  Lawrence Marseilles

## 2014-08-07 NOTE — [facility] Group Notes (Signed)
[facility] Group Notes:  (Nursing/MHT/Case Management/Adjunct)  Date:  08/07/2014  Time:  0900  Type of Therapy:  Nurse Education  Participation Level:  Active  Participation Quality:  Attentive  Affect:  Anxious  Cognitive:  Alert  Insight:  Improving  Engagement in Group:  Improving  Modes of Intervention:  Discussion, Education and Support  Summary of Progress/Problems: Patient attended and engaged in nurse wellness group.  Merian Capron [facility] 08/07/2014, 0930

## 2014-08-08 NOTE — Progress Notes (Signed)
Recreation Therapy Notes  Date: 07.29.16 Time: 9:30 am Location: [address] Group Room  Group Topic: Stress Management  Goal Area(s) Addresses:  Patient will verbalize importance of using healthy stress management.  Patient will identify positive emotions associated with healthy stress management.   Intervention: Stress Management  Activity :  Progressive Muscle Relaxation.  LRT introduced and educated patients on the technique of progressive muscle relaxation.  A script was used to deliver the technique to the patients.  Patients were asked to follow the script read a loud by the LRT to engage in practicing the stress management technique.  Education:  Stress Management, Discharge Planning.   Education Outcome: Acknowledges edcuation/In group clarification offered/Needs additional education  Clinical Observations/Feedback: Patient did not attend group.   Dean [name], LRT/CTRS         Dean [name] A 08/08/2014 3:19 PM 

## 2014-08-08 NOTE — [facility] Group Notes (Signed)
[facility] LCSW Group Therapy  08/08/2014 2:27 PM  Type of Therapy:  Group Therapy  Participation Level:  Did Not Attend, invited  Summary of Progress/Problem: The topic for today was feelings about relapse. Pt discussed what relapse prevention is to them and identified triggers that they are on the path to relapse. Pt processed their feeling towards relapse and was able to relate to peers. Pt discussed coping skills that can be used for relapse prevention.   Sallee Lange 08/08/2014, 2:27 PM

## 2014-08-08 NOTE — Progress Notes (Signed)
  [facility] Adult Case Management Discharge Plan :  Will you be returning to the same living situation after discharge:  No. At discharge, do you have transportation home?: Yes,  [facility] or ride set up by pt Do you have the ability to pay for your medications: Yes,  sent with 2 week supply  Release of information consent forms completed and in the chart;  Patient's signature needed at discharge.  Patient to Follow up at: [facility]  On Sunday at 2:00   [phone]  Patient denies SI/HI: Yes,  yes    Safety Planning and Suicide Prevention discussed: Yes,  yes  Have you used any form of tobacco in the last 30 days? (Cigarettes, Smokeless Tobacco, Cigars, and/or Pipes): Yes  Has patient been referred to the Quitline?: Patient refused referral  Ida Rogue 08/08/2014, 4:52 PM

## 2014-08-08 NOTE — Progress Notes (Signed)
Patient ID: Dean [name], male   DOB: 06-12-1980, 34 y.o.   MRN: [id number] D: Patient denies SI/HI and auditory and visual hallucinations.Patient is anxious and rated his anxiety as 7. Reports he is craving alcohol and is agitated.  A: Patient given emotional support from RN. Patient given medications per MD orders. Patient encouraged to attend groups and unit activities. Patient encouraged to come to staff with any questions or concerns.  R: Patient remains cooperative. Agitated by peers behavior at times. Will continue to monitor patient for safety.

## 2014-08-08 NOTE — Plan of Care (Signed)
Problem: Diagnosis: Increased Risk For Suicide Attempt Goal: STG-Patient Will Comply With Medication Regime Outcome: Progressing Pt compliant and reports he is tolerating medication well

## 2014-08-08 NOTE — Progress Notes (Signed)
Psychoeducational Group Note  Date:  08/08/2014 Time:  2100  Group Topic/Focus:  wrap up group  Participation Level: Did Not Attend  Participation Quality:  Not Applicable  Affect:  Not Applicable  Cognitive:  Not Applicable  Insight:  Not Applicable  Engagement in Group: Not Applicable  Additional Comments: An attempt was made to notifiy pt that group was beginning but pt  did not wake.   Shelah Lewandowsky 08/08/2014, 10:37 PM
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
## 2014-08-08 NOTE — Progress Notes (Signed)
Patient ID: Dean [name], male   DOB: 04-Oct-1980, 34 y.o.   MRN: [id number] PER STATE REGULATIONS 482.30  THIS CHART WAS REVIEWED FOR MEDICAL NECESSITY WITH RESPECT TO THE PATIENT'S ADMISSION/ DURATION OF STAY.  NEXT REVIEW DATE: 08/12/2014   Willa Rough, RN, BSN CASE MANAGER

## 2014-08-08 NOTE — [facility] Group Notes (Signed)
[facility] LCSW Aftercare Discharge Planning Group Note   08/08/2014 10:06 AM  Participation Quality:    Mood/Affect:  Defensive  Depression Rating: 0   Anxiety Rating:  4  Thoughts of Suicide:  No Will you contract for safety?   NA  Current AVH:  No  Plan for Discharge/Comments:  Declined at [facility], had phone screen at [facility], does not know admission decision, says facility will call CSW today  Transportation Means: [place] to [facility] if returning home  Supports: limited, estranged from family, encouraged to access 12 step groups  Sallee Lange

## 2014-08-08 NOTE — Plan of Care (Signed)
Problem: Alteration in mood & ability to function due to Goal: STG-Patient will comply with prescribed medication regimen (Patient will comply with prescribed medication regimen)  Outcome: Progressing Pt compliant with medication regimen reports tolerating medication well.

## 2014-08-08 NOTE — Progress Notes (Signed)
Patient ID: Dean [name], male   DOB: 1980-05-07, 34 y.o.   MRN: [id number] D: Patient appeared anxious on approach. Pt mood and affect appeared depressed and flat. Pt reports feeling anxious about placement. Pt smiled when told he is accepted into [facility] on Sunday. Pt reports he is tolerating medication well. Pt denies SI/HI/AVH.  Cooperative with assessment. No acute distressed noted at this time.   A: Met with pt 1:1. Medications administered as prescribed. Support and encouragement provided. Pt encouraged to discuss feelings and come to staff with any question or concerns.   R: Patient remains safe is safe and complaint with medications.

## 2014-08-09 NOTE — Progress Notes (Signed)
Patient ID: Dean [name], male   DOB: 23-Sep-1980, 34 y.o.   MRN: [id number] D: Patient appeared anxious on approach. Pt has possible discharge tomorrow and worried he will not be able to take subutex at [facility]. Pt stated subetex helps manage his pain.  Pt  is tolerating medication well. Pt denies SI/HI/AVH.  Cooperative with assessment. No acute distressed noted at this time.   A: Met with pt 1:1. Medications administered as prescribed. Support and encouragement provided. Pt encouraged to discuss feelings and come to staff with any question or concerns.   R: Patient remains  is safe and complaint with medications.
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
## 2014-08-09 NOTE — Progress Notes (Signed)
D: Patient pleasant and cooperative.  He is attending groups and participating in his treatment.  Patient has on-going chronic neck pain.  He rates his pain as a 6.  He continues to experience withdrawal symptoms of agitation and irritability.  His affect is flat; mood is anxious.  He denies any depressive symptoms rating his depression and hopelessness as a 0.  He is sleeping and eating well.  His goal today is to "move on."   A: Continue to monitor medication management and MD orders.  Safety checks completed every 15 minutes per protocol.  Offer encouragement and support as needed.  R: Patient is receptive to staff.

## 2014-08-09 NOTE — [facility] Group Notes (Signed)
[facility] Group Notes:  (Clinical Social Work)  08/09/2014     10-11AM  Summary of Progress/Problems:   The main focus of today's process group was to learn how to use a decisional balance exercise to move forward in the Stages of Change, which were described and discussed.  Motivational Interviewing and the whiteboard were utilized to help patients explore in depth the perceived benefits and costs of unhealthy coping techniques, as well as the  benefits and costs of replacing that with a healthy coping skills.   The patient expressed that their unhealthy coping involves drinking and he has decided he cannot return to that lifestyle.  He participated heavily and with some insight in the discussion.  Type of Therapy:  Group Therapy - Process   Participation Level:  Active  Participation Quality:  Appropriate, Attentive and Sharing  Affect:  Blunted  Cognitive:  Alert and Appropriate  Insight:  Developing/Improving  Engagement in Therapy:  Engaged  Modes of Intervention:  Education, Motivational Interviewing  Ambrose Mantle, LCSW 08/09/2014, 12:54 PM

## 2014-08-09 NOTE — [facility] Group Notes (Signed)
[facility] Group Notes:  (Nursing/MHT/Case Management/Adjunct)  Date:  08/09/2014  Time:  10:13 AM  Type of Therapy:  Psychoeducational Skills  Participation Level:  Active  Participation Quality:  Appropriate  Affect:  Appropriate  Cognitive:  Appropriate  Insight:  Appropriate  Engagement in Group:  Engaged  Modes of Intervention:  Discussion  Summary of Progress/Problems: Pt did attend self inventory group.  Jacquelyne Balint Shanta 08/09/2014, 10:13 AM

## 2014-08-09 NOTE — Progress Notes (Signed)
[facility] Group Notes:  (Nursing/MHT/Case Management/Adjunct)  Date:  08/09/2014  Time:  2100  Type of Therapy:  wrap up group  Participation Level:  Minimal  Participation Quality:  Attentive and Resistant  Affect:  Flat  Cognitive:  Appropriate  Insight:  Appropriate  Engagement in Group:  Lacking  Modes of Intervention:  Clarification, Education and Support  Summary of Progress/Problems:  Dean [name] 08/09/2014, 10:21 PM

## 2014-08-09 NOTE — Progress Notes (Signed)
Patient ID: Dean [name], male   DOB: 1980/05/11, 34 y.o.   MRN: [id number]  Adult Psychoeducational Group Note  Date:  08/09/2014 Time: 01:00pm   Group Topic/Focus:  Self Care:   The focus of this group is to help patients understand the importance of self-care in order to improve or restore emotional, physical, spiritual, interpersonal, and financial health.  Participation Level:  Minimal  Participation Quality:  Drowsy  Affect:  Flat  Cognitive:  Oriented  Insight: Lacking  Engagement in Group:  Lacking  Modes of Intervention:  Activity, Discussion, Education and Support  Additional Comments:  Pt unable to identify one personal strength of self care and did not wish to share his plans for post discharge self care.   Aurora Mask 08/09/2014, 3:48 PM

## 2014-08-10 MED ORDER — ACAMPROSATE CALCIUM 333 MG PO TBEC: 666.0000 mg | DELAYED_RELEASE_TABLET | Freq: Three times a day (TID) | ORAL | Status: DC

## 2014-08-10 MED ORDER — NICOTINE POLACRILEX 2 MG MT GUM: 2.0000 mg | CHEWING_GUM | OROMUCOSAL | Status: DC | PRN

## 2014-08-10 MED ORDER — FUROSEMIDE 40 MG PO TABS: 40.0000 mg | ORAL_TABLET | Freq: Every day | ORAL | Status: DC

## 2014-08-10 MED ORDER — HYDROXYZINE HCL 25 MG PO TABS: 25.0000 mg | ORAL_TABLET | Freq: Four times a day (QID) | ORAL | Status: DC | PRN

## 2014-08-10 MED ORDER — LEVETIRACETAM 500 MG PO TABS: 500.0000 mg | ORAL_TABLET | Freq: Two times a day (BID) | ORAL | Status: DC

## 2014-08-10 MED ORDER — CLONIDINE HCL 0.1 MG PO TABS: 0.1000 mg | ORAL_TABLET | Freq: Two times a day (BID) | ORAL | Status: DC

## 2014-08-10 MED ORDER — PANTOPRAZOLE SODIUM 40 MG PO TBEC: 40.0000 mg | DELAYED_RELEASE_TABLET | Freq: Every day | ORAL | Status: DC

## 2014-08-10 MED ORDER — METHYLPREDNISOLONE 4 MG PO TBPK: ORAL_TABLET | ORAL | Status: DC

## 2014-08-10 MED ORDER — METOPROLOL SUCCINATE ER 100 MG PO TB24: 100.0000 mg | ORAL_TABLET | Freq: Every day | ORAL | Status: DC

## 2014-08-10 MED ORDER — TRAZODONE HCL 50 MG PO TABS: 50.0000 mg | ORAL_TABLET | Freq: Every evening | ORAL | Status: DC | PRN

## 2014-08-10 MED ORDER — DULOXETINE HCL 60 MG PO CPEP: 60.0000 mg | ORAL_CAPSULE | Freq: Every day | ORAL | Status: DC

## 2014-08-10 MED ORDER — POTASSIUM CHLORIDE CRYS ER 20 MEQ PO TBCR: 20.0000 meq | EXTENDED_RELEASE_TABLET | Freq: Two times a day (BID) | ORAL | Status: DC

## 2014-08-10 NOTE — Plan of Care (Signed)
Problem: Alteration in mood & ability to function due to Goal: STG-Patient will attend groups Outcome: Progressing Pt attended and participated in evening wrap up group     
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
## 2014-08-10 NOTE — Plan of Care (Signed)
Problem: Ineffective individual coping Goal: STG-Increase in ability to manage activities of daily living Outcome: Progressing Patient is self sufficient and completes ADLs.   Problem: Diagnosis: Increased Risk For Suicide Attempt Goal: STG-Patient Will Report Suicidal Feelings to Staff Outcome: Progressing Patient denying SI

## 2014-08-10 NOTE — Progress Notes (Signed)
Patient is up and visible in the milieu. Preparing for discharge at 1400 today. Affect anxious with congruent mood however patient significantly improved since admission. States back pain today is a 7/10 and the pain is radiating down his arms. Worries how he will manage without subutex at [facility]. He reports his depression and anxiety are both a 3/10 and denies any hopelessness. Patient medicated per orders. Support and reassurance offered. Denies SI/HI. Awaiting discharge. Lawrence Marseilles

## 2014-08-10 NOTE — [facility] Group Notes (Signed)
[facility] Group Notes:  (Clinical Social Work)  08/10/2014  10:00-11:00AM  Summary of Progress/Problems:   The main focus of today's process group was to   1)  discuss the importance of adding supports  2)  define health supports versus unhealthy supports  3)  identify the patient's current unhealthy supports and plan how to handle them  4)  Identify the patient's current healthy supports and plan what to add.  An emphasis was placed on using counselor, doctor, therapy groups, 12-step groups, and problem-specific support groups to expand supports.    The patient expressed full comprehension of the concepts presented, and agreed that there is a need to add more supports.  He was encouraging and acted as though he feels some positive supports can be put in place, is hopeful to actually use them.  Type of Therapy:  Process Group with Motivational Interviewing  Participation Level:  Active  Participation Quality:  Attentive  Affect:  Blunted  Cognitive:  Alert and Appropriate  Insight:  Engaged  Engagement in Therapy:  Engaged  Modes of Intervention:   Education, Support and Processing, Activity  Ambrose Mantle, LCSW 08/10/2014

## 2014-08-10 NOTE — Progress Notes (Signed)
Patient packed and [facility] representative here. Teaching completed, Rx's given and samples provided. Patient verbalizes understanding. All belongings returned. Patient denies SI/HI and is discharged in stable condition to [facility]. Lawrence Marseilles

## 2014-08-10 NOTE — [facility] Group Notes (Signed)
[facility] Group Notes:  (Nursing/MHT/Case Management/Adjunct)  Date:  08/10/2014  Time:  0915  Type of Therapy:  Nurse Education  Participation Level:  Active  Participation Quality:  Attentive  Affect:  Anxious  Cognitive:  Alert  Insight:  Good  Engagement in Group:  Engaged  Modes of Intervention:  Discussion, Education and Support  Summary of Progress/Problems: Patient attended and participated in group. Discussion revolved around healthy support systems and patient was able to identify his. Patient also participated in progressive relaxation exercise.   Merian Capron [facility] 08/10/2014, 11:29 AM

## 2014-08-10 NOTE — [facility] Suicide Risk Assessment (Signed)
[facility] Discharge Suicide Risk Assessment   Demographic Factors:  Caucasian, Living alone and Unemployed  Total Time spent with patient: 30 minutes  Musculoskeletal: Strength & Muscle Tone: within normal limits Gait & Station: normal Patient leans: N/A  Psychiatric Specialty Exam: Physical Exam  ROS  Blood pressure 128/92, pulse 86, temperature 98.9 F (37.2 C), temperature source Oral, resp. rate 20, height  (1.676 m), weight 98.884 kg (218 lb).Body mass index is 35.2 kg/(m^2).  General Appearance: Casual  Eye Contact::  Good  Speech:  Normal Rate409  Volume:  Normal  Mood:  Anxious  Affect:  Appropriate  Thought Process:  Coherent  Orientation:  Full (Time, Place, and Person)  Thought Content:  WDL  Suicidal Thoughts:  No  Homicidal Thoughts:  No  Memory:  Immediate;   Good Recent;   Good Remote;   Good  Judgement:  Good  Insight:  Good  Psychomotor Activity:  Normal  Concentration:  Good  Recall:  Good  Fund of Knowledge:Good  Language: Good  Akathisia:  No  Handed:  Right  AIMS (if indicated):     Assets:  Communication Skills Desire for Improvement Financial Resources/Insurance Housing Physical Health Transportation  Sleep:  Number of Hours: 6.5  Cognition: WNL  ADL's:  Intact   Have you used any form of tobacco in the last 30 days? (Cigarettes, Smokeless Tobacco, Cigars, and/or Pipes): Yes  Has this patient used any form of tobacco in the last 30 days? (Cigarettes, Smokeless Tobacco, Cigars, and/or Pipes) Yes, Prescription not provided because: patient refused  Mental Status Per Nursing Assessment::   On Admission:   (denies all)  Current Mental Status by Physician: see above  Loss Factors: Loss of significant relationship and Financial problems/change in socioeconomic status  Historical Factors: Family history of mental illness or substance abuse and Impulsivity  Risk Reduction Factors:   Sense of responsibility to family, Religious beliefs  about death, Positive social support, Positive therapeutic relationship and Positive coping skills or problem solving skills  Continued Clinical Symptoms:  Depression:   Impulsivity Alcohol/Substance Abuse/Dependencies More than one psychiatric diagnosis Unstable or Poor Therapeutic Relationship  Cognitive Features That Contribute To Risk:  None    Suicide Risk:  Minimal: No identifiable suicidal ideation.  Patients presenting with no risk factors but with morbid ruminations; may be classified as minimal risk based on the severity of the depressive symptoms  Principal Problem: Alcohol use disorder, severe, dependence Discharge Diagnoses:  Patient Active Problem List   Diagnosis Date Noted  . Alcohol use disorder, severe, dependence [F10.20] 08/04/2014  . Altered mental status [R41.82] 06/23/2014  . Alcohol dependence with uncomplicated withdrawal [F10.230] 04/05/2014  . Alcohol abuse [F10.10]   . Atrial fibrillation with rapid ventricular response [I48.91] 11/12/2013  . Atrial fibrillation with RVR [I48.91] 11/12/2013  . Thrombocytopenia [D69.6] 11/12/2013  . Hypokalemia [E87.6] 11/12/2013  . Hyponatremia [E87.1] 11/12/2013  . Abscessed tooth [K04.7] 11/12/2013  . Abnormal liver function test [R79.89]   . Seizure [R56.9] 04/12/2011  . Nausea and vomiting [787.0] 04/08/2011  . Diarrhea [R19.7] 04/08/2011  . Polysubstance dependence including opioid type drug, continuous use [F11.229] 04/08/2011  . Substance induced mood disorder [F19.94] 04/08/2011  . Delirium tremens [F10.231] 10/30/2010  . Post traumatic stress disorder (PTSD) [F43.10] 10/30/2010  . Bipolar 1 disorder [F31.9] 10/30/2010  . H/O ETOH abuse [F10.10] 06/09/2010  . Fatty liver [K76.0] 04/19/2010  . Elevated liver enzymes [R74.8] 04/19/2010    Follow-up Information    Follow up with [facility]  On 08/10/2014.   Why:  You have been accepted into that program at 2:00 on Sunday.   Contact information:   [address] [place]  [phone]      Plan Of Care/Follow-up recommendations:  Activity:  as tolerated Diet:  un changed from past  Is patient on multiple antipsychotic therapies at discharge:  No   Has Patient had three or more failed trials of antipsychotic monotherapy by history:  No  Recommended Plan for Multiple Antipsychotic Therapies: NA    Neriyah Cercone T. 08/10/2014, 9:46 AM

## 2014-10-22 DIAGNOSIS — L03116 Cellulitis of left lower limb: Secondary | ICD-10-CM | POA: Diagnosis not present

## 2014-10-22 DIAGNOSIS — T148 Other injury of unspecified body region: Secondary | ICD-10-CM | POA: Diagnosis not present

## 2014-11-27 ENCOUNTER — Inpatient Hospital Stay (HOSPITAL_COMMUNITY)
Admission: EM | Admit: 2014-11-27 | Discharge: 2014-12-11 | DRG: 963 | Disposition: A | Discharge status: Home / Self Care | Payer: Medicare Other | Attending: Surgery | Admitting: Surgery

## 2014-11-27 ENCOUNTER — Emergency Department (HOSPITAL_COMMUNITY): Payer: Medicare Other

## 2014-11-27 ENCOUNTER — Inpatient Hospital Stay: Admission: EM | Admit: 2014-11-27 | Payer: Self-pay | Source: Other Acute Inpatient Hospital | Admitting: Surgery

## 2014-11-27 ENCOUNTER — Encounter (HOSPITAL_COMMUNITY): Payer: Self-pay | Admitting: Emergency Medicine

## 2014-11-27 DIAGNOSIS — S36030A Superficial (capsular) laceration of spleen, initial encounter: Secondary | ICD-10-CM | POA: Diagnosis present

## 2014-11-27 DIAGNOSIS — S36031A Moderate laceration of spleen, initial encounter: Secondary | ICD-10-CM | POA: Diagnosis not present

## 2014-11-27 DIAGNOSIS — D62 Acute posthemorrhagic anemia: Secondary | ICD-10-CM | POA: Diagnosis not present

## 2014-11-27 DIAGNOSIS — F319 Bipolar disorder, unspecified: Secondary | ICD-10-CM | POA: Diagnosis not present

## 2014-11-27 DIAGNOSIS — Z452 Encounter for adjustment and management of vascular access device: Secondary | ICD-10-CM | POA: Diagnosis not present

## 2014-11-27 DIAGNOSIS — R0602 Shortness of breath: Secondary | ICD-10-CM | POA: Diagnosis not present

## 2014-11-27 DIAGNOSIS — S36039A Unspecified laceration of spleen, initial encounter: Secondary | ICD-10-CM | POA: Diagnosis not present

## 2014-11-27 DIAGNOSIS — Z9289 Personal history of other medical treatment: Secondary | ICD-10-CM

## 2014-11-27 DIAGNOSIS — Z91199 Patient's noncompliance with other medical treatment and regimen due to unspecified reason: Secondary | ICD-10-CM

## 2014-11-27 DIAGNOSIS — E2749 Other adrenocortical insufficiency: Secondary | ICD-10-CM | POA: Diagnosis present

## 2014-11-27 DIAGNOSIS — J9600 Acute respiratory failure, unspecified whether with hypoxia or hypercapnia: Secondary | ICD-10-CM | POA: Diagnosis not present

## 2014-11-27 DIAGNOSIS — J90 Pleural effusion, not elsewhere classified: Secondary | ICD-10-CM

## 2014-11-27 DIAGNOSIS — Z9911 Dependence on respirator [ventilator] status: Secondary | ICD-10-CM | POA: Diagnosis not present

## 2014-11-27 DIAGNOSIS — J189 Pneumonia, unspecified organism: Secondary | ICD-10-CM | POA: Diagnosis not present

## 2014-11-27 DIAGNOSIS — S199XXA Unspecified injury of neck, initial encounter: Secondary | ICD-10-CM | POA: Diagnosis not present

## 2014-11-27 DIAGNOSIS — R45851 Suicidal ideations: Secondary | ICD-10-CM | POA: Diagnosis not present

## 2014-11-27 DIAGNOSIS — G40909 Epilepsy, unspecified, not intractable, without status epilepticus: Secondary | ICD-10-CM | POA: Diagnosis not present

## 2014-11-27 DIAGNOSIS — S37812A Contusion of adrenal gland, initial encounter: Secondary | ICD-10-CM | POA: Diagnosis present

## 2014-11-27 DIAGNOSIS — S0990XA Unspecified injury of head, initial encounter: Secondary | ICD-10-CM | POA: Diagnosis not present

## 2014-11-27 DIAGNOSIS — F4310 Post-traumatic stress disorder, unspecified: Secondary | ICD-10-CM | POA: Diagnosis present

## 2014-11-27 DIAGNOSIS — F17210 Nicotine dependence, cigarettes, uncomplicated: Secondary | ICD-10-CM | POA: Diagnosis present

## 2014-11-27 DIAGNOSIS — M542 Cervicalgia: Secondary | ICD-10-CM | POA: Diagnosis not present

## 2014-11-27 DIAGNOSIS — S36113A Laceration of liver, unspecified degree, initial encounter: Secondary | ICD-10-CM | POA: Diagnosis not present

## 2014-11-27 DIAGNOSIS — J9690 Respiratory failure, unspecified, unspecified whether with hypoxia or hypercapnia: Secondary | ICD-10-CM

## 2014-11-27 DIAGNOSIS — R0682 Tachypnea, not elsewhere classified: Secondary | ICD-10-CM | POA: Diagnosis not present

## 2014-11-27 DIAGNOSIS — S36115A Moderate laceration of liver, initial encounter: Secondary | ICD-10-CM | POA: Diagnosis not present

## 2014-11-27 DIAGNOSIS — Z95828 Presence of other vascular implants and grafts: Secondary | ICD-10-CM

## 2014-11-27 DIAGNOSIS — S2249XA Multiple fractures of ribs, unspecified side, initial encounter for closed fracture: Secondary | ICD-10-CM | POA: Diagnosis not present

## 2014-11-27 DIAGNOSIS — R51 Headache: Secondary | ICD-10-CM | POA: Diagnosis not present

## 2014-11-27 DIAGNOSIS — S2242XA Multiple fractures of ribs, left side, initial encounter for closed fracture: Secondary | ICD-10-CM | POA: Diagnosis present

## 2014-11-27 DIAGNOSIS — S2232XA Fracture of one rib, left side, initial encounter for closed fracture: Secondary | ICD-10-CM | POA: Diagnosis not present

## 2014-11-27 DIAGNOSIS — S2239XA Fracture of one rib, unspecified side, initial encounter for closed fracture: Secondary | ICD-10-CM

## 2014-11-27 DIAGNOSIS — F1920 Other psychoactive substance dependence, uncomplicated: Secondary | ICD-10-CM | POA: Diagnosis not present

## 2014-11-27 DIAGNOSIS — F1120 Opioid dependence, uncomplicated: Secondary | ICD-10-CM | POA: Diagnosis not present

## 2014-11-27 DIAGNOSIS — R000 Tachycardia, unspecified: Secondary | ICD-10-CM | POA: Diagnosis not present

## 2014-11-27 DIAGNOSIS — F10239 Alcohol dependence with withdrawal, unspecified: Secondary | ICD-10-CM | POA: Diagnosis present

## 2014-11-27 DIAGNOSIS — R079 Chest pain, unspecified: Secondary | ICD-10-CM | POA: Diagnosis present

## 2014-11-27 DIAGNOSIS — J9811 Atelectasis: Secondary | ICD-10-CM | POA: Diagnosis not present

## 2014-11-27 DIAGNOSIS — R1313 Dysphagia, pharyngeal phase: Secondary | ICD-10-CM | POA: Diagnosis not present

## 2014-11-27 DIAGNOSIS — R069 Unspecified abnormalities of breathing: Secondary | ICD-10-CM | POA: Diagnosis not present

## 2014-11-27 DIAGNOSIS — R918 Other nonspecific abnormal finding of lung field: Secondary | ICD-10-CM | POA: Diagnosis not present

## 2014-11-27 DIAGNOSIS — Z9119 Patient's noncompliance with other medical treatment and regimen: Secondary | ICD-10-CM

## 2014-11-27 DIAGNOSIS — Z4682 Encounter for fitting and adjustment of non-vascular catheter: Secondary | ICD-10-CM | POA: Diagnosis not present

## 2014-11-27 LAB — URINALYSIS, ROUTINE W REFLEX MICROSCOPIC
Bilirubin Urine: NEGATIVE
Glucose, UA: NEGATIVE mg/dL
Ketones, ur: NEGATIVE mg/dL
Leukocytes, UA: NEGATIVE
Nitrite: NEGATIVE
Protein, ur: 100 mg/dL — AB
Specific Gravity, Urine: >1.03 — ABNORMAL HIGH (ref 1.005–1.030)
pH: 5 (ref 5.0–8.0)

## 2014-11-27 LAB — COMPREHENSIVE METABOLIC PANEL
ALT: 169 U/L — ABNORMAL HIGH (ref 17–63)
ALT: 129 U/L — ABNORMAL HIGH (ref 17–63)
AST: 537 U/L — ABNORMAL HIGH (ref 15–41)
AST: 461 U/L — ABNORMAL HIGH (ref 15–41)
Albumin: 3.1 g/dL — ABNORMAL LOW (ref 3.5–5.0)
Albumin: 2.5 g/dL — ABNORMAL LOW (ref 3.5–5.0)
Alkaline Phosphatase: 93 U/L (ref 38–126)
Alkaline Phosphatase: 71 U/L (ref 38–126)
Anion gap: 11 (ref 5–15)
Anion gap: 7 (ref 5–15)
BUN: <5 mg/dL — ABNORMAL LOW (ref 6–20)
BUN: <5 mg/dL — ABNORMAL LOW (ref 6–20)
CO2: 21 mmol/L — ABNORMAL LOW (ref 22–32)
CO2: 21 mmol/L — ABNORMAL LOW (ref 22–32)
Calcium: 7.9 mg/dL — ABNORMAL LOW (ref 8.9–10.3)
Calcium: 7.3 mg/dL — ABNORMAL LOW (ref 8.9–10.3)
Chloride: 105 mmol/L (ref 101–111)
Chloride: 110 mmol/L (ref 101–111)
Creatinine, Ser: 0.85 mg/dL (ref 0.61–1.24)
Creatinine, Ser: 0.66 mg/dL (ref 0.61–1.24)
GFR calc Af Amer: >60 mL/min (ref >60)
GFR calc Af Amer: >60 mL/min (ref >60)
GFR calc non Af Amer: >60 mL/min (ref >60)
GFR calc non Af Amer: >60 mL/min (ref >60)
Glucose, Bld: 161 mg/dL — ABNORMAL HIGH (ref 65–99)
Glucose, Bld: 151 mg/dL — ABNORMAL HIGH (ref 65–99)
Potassium: 4.2 mmol/L (ref 3.5–5.1)
Potassium: 4.7 mmol/L (ref 3.5–5.1)
Sodium: 137 mmol/L (ref 135–145)
Sodium: 138 mmol/L (ref 135–145)
Total Bilirubin: 0.6 mg/dL (ref 0.3–1.2)
Total Bilirubin: 0.7 mg/dL (ref 0.3–1.2)
Total Protein: 6.3 g/dL — ABNORMAL LOW (ref 6.5–8.1)
Total Protein: 5.1 g/dL — ABNORMAL LOW (ref 6.5–8.1)

## 2014-11-27 LAB — PROTIME-INR
INR: 1.18 (ref 0.00–1.49)
Prothrombin Time: 15.2 seconds (ref 11.6–15.2)

## 2014-11-27 LAB — CBC WITH DIFFERENTIAL/PLATELET
Basophils Absolute: 0.1 10*3/uL (ref 0.0–0.1)
Basophils Relative: 0 %
Eosinophils Absolute: 0.1 10*3/uL (ref 0.0–0.7)
Eosinophils Relative: 0 %
HCT: 33.3 % — ABNORMAL LOW (ref 39.0–52.0)
Hemoglobin: 10.6 g/dL — ABNORMAL LOW (ref 13.0–17.0)
Lymphocytes Relative: 12 %
Lymphs Abs: 1.6 10*3/uL (ref 0.7–4.0)
MCH: 30.7 pg (ref 26.0–34.0)
MCHC: 31.8 g/dL (ref 30.0–36.0)
MCV: 96.5 fL (ref 78.0–100.0)
Monocytes Absolute: 1 10*3/uL (ref 0.1–1.0)
Monocytes Relative: 8 %
Neutro Abs: 10.6 10*3/uL — ABNORMAL HIGH (ref 1.7–7.7)
Neutrophils Relative %: 80 %
Platelets: 189 10*3/uL (ref 150–400)
RBC: 3.45 MIL/uL — ABNORMAL LOW (ref 4.22–5.81)
RDW: 14.5 % (ref 11.5–15.5)
WBC: 13.4 10*3/uL — ABNORMAL HIGH (ref 4.0–10.5)

## 2014-11-27 LAB — CBC
HCT: 26.5 % — ABNORMAL LOW (ref 39.0–52.0)
Hemoglobin: 8.6 g/dL — ABNORMAL LOW (ref 13.0–17.0)
MCH: 30.7 pg (ref 26.0–34.0)
MCHC: 32.5 g/dL (ref 30.0–36.0)
MCV: 94.6 fL (ref 78.0–100.0)
Platelets: 142 10*3/uL — ABNORMAL LOW (ref 150–400)
RBC: 2.8 MIL/uL — ABNORMAL LOW (ref 4.22–5.81)
RDW: 15 % (ref 11.5–15.5)
WBC: 8 10*3/uL (ref 4.0–10.5)

## 2014-11-27 LAB — URINE MICROSCOPIC-ADD ON

## 2014-11-27 LAB — ETHANOL: Alcohol, Ethyl (B): 147 mg/dL — ABNORMAL HIGH (ref <5)

## 2014-11-27 LAB — RAPID URINE DRUG SCREEN, HOSP PERFORMED
Amphetamines: POSITIVE — AB
Barbiturates: NOT DETECTED
Benzodiazepines: POSITIVE — AB
Cocaine: POSITIVE — AB
Opiates: NOT DETECTED
Tetrahydrocannabinol: NOT DETECTED

## 2014-11-27 LAB — APTT: aPTT: 27 seconds (ref 24–37)

## 2014-11-27 LAB — ABO/RH: ABO/RH(D): O POS

## 2014-11-27 LAB — PREPARE RBC (CROSSMATCH)

## 2014-11-27 LAB — TYPE AND SCREEN
ABO/RH(D): O POS
Antibody Screen: NEGATIVE

## 2014-11-27 LAB — AMMONIA: Ammonia: 34 umol/L (ref 9–35)

## 2014-11-27 LAB — BRAIN NATRIURETIC PEPTIDE: B Natriuretic Peptide: 38 pg/mL (ref 0.0–100.0)

## 2014-11-27 MED ORDER — LORAZEPAM 2 MG/ML IJ SOLN
0.0000 mg | Freq: Two times a day (BID) | INTRAMUSCULAR | Status: DC
  Administered 2014-11-30: 2 mg via INTRAVENOUS
  Filled 2014-11-27: qty 1

## 2014-11-27 MED ORDER — THIAMINE HCL 100 MG/ML IJ SOLN
100.0000 mg | Freq: Every day | INTRAMUSCULAR | Status: DC
  Administered 2014-11-30 – 2014-12-10 (×11): 100 mg via INTRAVENOUS
  Filled 2014-11-27: qty 1
  Filled 2014-11-27 (×2): qty 2
  Filled 2014-11-27 (×2): qty 1
  Filled 2014-11-27: qty 2
  Filled 2014-11-27 (×2): qty 1
  Filled 2014-11-27: qty 2
  Filled 2014-11-27 (×3): qty 1
  Filled 2014-11-27 (×2): qty 2
  Filled 2014-11-27: qty 1
  Filled 2014-11-27: qty 2
  Filled 2014-11-27 (×3): qty 1

## 2014-11-27 MED ORDER — CLONIDINE HCL 0.1 MG PO TABS
0.1000 mg | ORAL_TABLET | Freq: Two times a day (BID) | ORAL | Status: DC
  Administered 2014-11-27 – 2014-11-29 (×5): 0.1 mg via ORAL
  Filled 2014-11-27 (×8): qty 1

## 2014-11-27 MED ORDER — KCL IN DEXTROSE-NACL 20-5-0.9 MEQ/L-%-% IV SOLN
INTRAVENOUS | Status: DC
  Administered 2014-11-27 – 2014-11-28 (×2): via INTRAVENOUS
  Administered 2014-11-29 – 2014-11-30 (×2): 100 mL/h via INTRAVENOUS
  Administered 2014-12-02: 50 mL/h via INTRAVENOUS
  Administered 2014-12-04: 17:00:00 via INTRAVENOUS
  Administered 2014-12-05: 50 mL/h via INTRAVENOUS
  Filled 2014-11-27 (×24): qty 1000

## 2014-11-27 MED ORDER — PANTOPRAZOLE SODIUM 40 MG IV SOLR
40.0000 mg | Freq: Every day | INTRAVENOUS | Status: DC
  Administered 2014-11-30 – 2014-12-06 (×7): 40 mg via INTRAVENOUS
  Filled 2014-11-27 (×14): qty 40

## 2014-11-27 MED ORDER — IOHEXOL 300 MG/ML  SOLN
100.0000 mL | Freq: Once | INTRAMUSCULAR | Status: AC | PRN
  Administered 2014-11-27: 100 mL via INTRAVENOUS

## 2014-11-27 MED ORDER — SODIUM CHLORIDE 0.9 % IV BOLUS (SEPSIS)
1000.0000 mL | Freq: Once | INTRAVENOUS | Status: AC
  Administered 2014-11-27: 1000 mL via INTRAVENOUS

## 2014-11-27 MED ORDER — ONDANSETRON HCL 4 MG PO TABS
4.0000 mg | ORAL_TABLET | Freq: Four times a day (QID) | ORAL | Status: DC | PRN
  Filled 2014-11-27: qty 1

## 2014-11-27 MED ORDER — HYDROMORPHONE HCL 1 MG/ML IJ SOLN
INTRAMUSCULAR | Status: AC
  Administered 2014-11-27: 1 mg via INTRAVENOUS
  Filled 2014-11-27: qty 1

## 2014-11-27 MED ORDER — METOPROLOL SUCCINATE ER 100 MG PO TB24
100.0000 mg | ORAL_TABLET | Freq: Every day | ORAL | Status: DC
  Administered 2014-11-27 – 2014-11-29 (×3): 100 mg via ORAL
  Filled 2014-11-27 (×4): qty 1

## 2014-11-27 MED ORDER — ACAMPROSATE CALCIUM 333 MG PO TBEC
666.0000 mg | DELAYED_RELEASE_TABLET | Freq: Three times a day (TID) | ORAL | Status: DC
  Filled 2014-11-27 (×10): qty 2

## 2014-11-27 MED ORDER — HYDROMORPHONE HCL 1 MG/ML IJ SOLN: 1.0000 mg | INTRAMUSCULAR | Status: DC | PRN

## 2014-11-27 MED ORDER — LORAZEPAM 2 MG/ML IJ SOLN
0.0000 mg | Freq: Four times a day (QID) | INTRAMUSCULAR | Status: AC
Start: 2014-11-27 — End: 2014-11-29
  Administered 2014-11-27: 1 mg via INTRAVENOUS
  Administered 2014-11-29: 2 mg via INTRAVENOUS
  Administered 2014-11-29: 1 mg via INTRAVENOUS
  Filled 2014-11-27 (×3): qty 1

## 2014-11-27 MED ORDER — VITAMIN B-1 100 MG PO TABS
100.0000 mg | ORAL_TABLET | Freq: Every day | ORAL | Status: DC
  Administered 2014-11-27 – 2014-11-29 (×3): 100 mg via ORAL
  Filled 2014-11-27 (×5): qty 1

## 2014-11-27 MED ORDER — HYDROMORPHONE HCL 1 MG/ML IJ SOLN
1.0000 mg | INTRAMUSCULAR | Status: DC | PRN
  Administered 2014-11-27 – 2014-11-28 (×9): 1 mg via INTRAVENOUS
  Filled 2014-11-27 (×8): qty 1

## 2014-11-27 MED ORDER — ONDANSETRON HCL 4 MG/2ML IJ SOLN
4.0000 mg | Freq: Four times a day (QID) | INTRAMUSCULAR | Status: DC | PRN
  Administered 2014-11-27 – 2014-12-05 (×2): 4 mg via INTRAVENOUS
  Filled 2014-11-27 (×2): qty 2

## 2014-11-27 MED ORDER — ADULT MULTIVITAMIN W/MINERALS CH
1.0000 | ORAL_TABLET | Freq: Every day | ORAL | Status: DC
  Administered 2014-11-27 – 2014-11-29 (×3): 1 via ORAL
  Filled 2014-11-27 (×5): qty 1

## 2014-11-27 MED ORDER — LORAZEPAM 1 MG PO TABS: 1.0000 mg | ORAL_TABLET | Freq: Four times a day (QID) | ORAL | Status: DC | PRN

## 2014-11-27 MED ORDER — SODIUM CHLORIDE 0.9 % IV SOLN
Freq: Once | INTRAVENOUS | Status: AC
  Administered 2014-11-27: 23:00:00 via INTRAVENOUS

## 2014-11-27 MED ORDER — PANTOPRAZOLE SODIUM 40 MG PO TBEC: 40.0000 mg | DELAYED_RELEASE_TABLET | Freq: Every day | ORAL | Status: DC

## 2014-11-27 MED ORDER — LORAZEPAM 2 MG/ML IJ SOLN
1.0000 mg | Freq: Four times a day (QID) | INTRAMUSCULAR | Status: DC | PRN
  Administered 2014-11-30: 1 mg via INTRAVENOUS
  Filled 2014-11-27: qty 1

## 2014-11-27 MED ORDER — PANTOPRAZOLE SODIUM 40 MG PO TBEC
40.0000 mg | DELAYED_RELEASE_TABLET | Freq: Every day | ORAL | Status: DC
  Administered 2014-11-27 – 2014-11-29 (×3): 40 mg via ORAL
  Filled 2014-11-27 (×3): qty 1

## 2014-11-27 MED ORDER — FOLIC ACID 1 MG PO TABS
1.0000 mg | ORAL_TABLET | Freq: Every day | ORAL | Status: DC
  Administered 2014-11-27 – 2014-11-29 (×3): 1 mg via ORAL
  Filled 2014-11-27 (×5): qty 1

## 2014-11-27 MED ORDER — HYDROXYZINE HCL 25 MG PO TABS
25.0000 mg | ORAL_TABLET | Freq: Four times a day (QID) | ORAL | Status: DC | PRN
  Administered 2014-11-27 – 2014-11-30 (×3): 25 mg via ORAL
  Filled 2014-11-27 (×6): qty 1

## 2014-11-27 MED ORDER — CETYLPYRIDINIUM CHLORIDE 0.05 % MT LIQD
7.0000 mL | Freq: Two times a day (BID) | OROMUCOSAL | Status: DC
  Administered 2014-11-27 – 2014-11-29 (×5): 7 mL via OROMUCOSAL

## 2014-11-27 MED ORDER — DULOXETINE HCL 60 MG PO CPEP
60.0000 mg | ORAL_CAPSULE | Freq: Every day | ORAL | Status: DC
  Administered 2014-11-27 – 2014-11-29 (×3): 60 mg via ORAL
  Filled 2014-11-27 (×4): qty 1

## 2014-11-27 MED ORDER — LEVETIRACETAM 500 MG PO TABS
500.0000 mg | ORAL_TABLET | Freq: Two times a day (BID) | ORAL | Status: DC
  Administered 2014-11-27 – 2014-11-29 (×5): 500 mg via ORAL
  Filled 2014-11-27: qty 2
  Filled 2014-11-27 (×5): qty 1
  Filled 2014-11-27: qty 2
  Filled 2014-11-27 (×2): qty 1
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
## 2014-11-27 NOTE — Progress Notes (Signed)
Lab Update, Significant change  HGB=8.6, down 2 grams from prior lab draw  Date of notification:  11/27/2014  Time of notification:  9:27 PM  MD notified (1st page):  Dr. Luisa Hartornett, MD (Trauma pager [phone])  Time of first page:  2124  MD notified (2nd page):  Dr. Luisa Hartornett, MD (Trauma)  Time of second page: 2132  Responding MD:  Dr. Luisa Hartornett, MD  Time MD responded:  2134  Orders received to Type and Cross 4 units, Transfuse 2 units
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
## 2014-11-28 ENCOUNTER — Inpatient Hospital Stay (HOSPITAL_COMMUNITY): Payer: Medicare Other

## 2014-11-28 LAB — HEMOGLOBIN AND HEMATOCRIT, BLOOD
HCT: 33.2 % — ABNORMAL LOW (ref 39.0–52.0)
HCT: 29.3 % — ABNORMAL LOW (ref 39.0–52.0)
Hemoglobin: 11.1 g/dL — ABNORMAL LOW (ref 13.0–17.0)
Hemoglobin: 9.4 g/dL — ABNORMAL LOW (ref 13.0–17.0)

## 2014-11-28 LAB — MRSA PCR SCREENING: MRSA by PCR: NEGATIVE

## 2014-11-28 MED ORDER — LEVALBUTEROL HCL 0.63 MG/3ML IN NEBU
0.6300 mg | INHALATION_SOLUTION | Freq: Four times a day (QID) | RESPIRATORY_TRACT | Status: DC | PRN
  Administered 2014-11-28 – 2014-11-30 (×6): 0.63 mg via RESPIRATORY_TRACT
  Filled 2014-11-28 (×6): qty 3

## 2014-11-28 MED ORDER — DIPHENHYDRAMINE HCL 50 MG/ML IJ SOLN: 12.5000 mg | Freq: Four times a day (QID) | INTRAMUSCULAR | Status: DC | PRN

## 2014-11-28 MED ORDER — HYDROMORPHONE 1 MG/ML IV SOLN
INTRAVENOUS | Status: DC
  Administered 2014-11-28: 19:00:00 via INTRAVENOUS

## 2014-11-28 MED ORDER — SODIUM CHLORIDE 0.9 % IV BOLUS (SEPSIS)
500.0000 mL | Freq: Once | INTRAVENOUS | Status: AC
  Administered 2014-11-28: 500 mL via INTRAVENOUS

## 2014-11-28 MED ORDER — DIPHENHYDRAMINE HCL 12.5 MG/5ML PO ELIX: 12.5000 mg | ORAL_SOLUTION | Freq: Four times a day (QID) | ORAL | Status: DC | PRN

## 2014-11-28 MED ORDER — SODIUM CHLORIDE 0.9 % IJ SOLN: 9.0000 mL | INTRAMUSCULAR | Status: DC | PRN

## 2014-11-28 MED ORDER — HYDROMORPHONE 1 MG/ML IV SOLN
INTRAVENOUS | Status: DC
  Administered 2014-11-29: 0.2 mg via INTRAVENOUS
  Administered 2014-11-29: 0.8 mg via INTRAVENOUS
  Administered 2014-11-29: 2.5 mg via INTRAVENOUS
  Administered 2014-11-29: 1.6 mg via INTRAVENOUS
  Administered 2014-11-29: 1 mg via INTRAVENOUS
  Administered 2014-11-29: 0.8 mg via INTRAVENOUS
  Administered 2014-11-30: 1.39 mg via INTRAVENOUS
  Administered 2014-11-30: 1.2 mg via INTRAVENOUS
  Administered 2014-11-30: 1.6 mg via INTRAVENOUS

## 2014-11-28 MED ORDER — ONDANSETRON HCL 4 MG/2ML IJ SOLN: 4.0000 mg | Freq: Four times a day (QID) | INTRAMUSCULAR | Status: DC | PRN

## 2014-11-28 MED ORDER — SODIUM CHLORIDE 0.9 % IV BOLUS (SEPSIS)
1000.0000 mL | Freq: Once | INTRAVENOUS | Status: AC
  Administered 2014-11-28: 1000 mL via INTRAVENOUS

## 2014-11-28 MED ORDER — HYDROMORPHONE 1 MG/ML IV SOLN
INTRAVENOUS | Status: DC
  Filled 2014-11-28: qty 25

## 2014-11-28 MED ORDER — NALOXONE HCL 0.4 MG/ML IJ SOLN: 0.4000 mg | INTRAMUSCULAR | Status: DC | PRN

## 2014-11-28 MED ORDER — SODIUM CHLORIDE 0.9 % IJ SOLN
9.0000 mL | INTRAMUSCULAR | Status: DC | PRN
Start: 2014-11-28 — End: 2014-11-28

## 2014-11-28 NOTE — Progress Notes (Signed)
11/28/2014 1200 Dr. Lindie SpruceWyatt paged and made aware of pt. Decreased UOP this am 15-25 ml/hr. Telephone orders received for 500cc NS bolus x1. Orders enacted. Will continue to closely monitor patient.  Bazil Dhanani, Blanchard KelchStephanie Ingold

## 2014-11-28 NOTE — Progress Notes (Addendum)
11/28/2014 6:02 PM Nursing note Dr. Dwain SarnaWakefield paged and made aware of Hgb drop from 11.1 to 9.4 this evening as well as pt. Concerns for ineffective pain management.  MD also made aware of pink sediment in urine. Orders received to repeat Hgb check at midnight and for reduced dose Dilaudid PCA. Orders enacted and patient updated on plan of care. Will continue to closely monitor patient.  Dean [name], Blanchard KelchStephanie Ingold

## 2014-11-28 NOTE — Progress Notes (Signed)
Dr. Dwain SarnaWakefield notified that patient's pain is still 10/10 with initiation of reduced dose Dilaudid PCA. Order received to increase to full dose PCA. Also made aware that urine output is low, and that his lungs are congested, and very wheezy. Order received for 1 liter NS, and to give Xopenex PRN treatments. Will continue to closely monitor. Thresa RossA. Gaylon Bentz RN
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
## 2014-11-28 NOTE — Plan of Care (Signed)
Problem: Pain Managment: Goal: General experience of comfort will improve Outcome: Not Progressing Pt still requiring large amounts of pain medication.  Problem: Activity: Goal: Risk for activity intolerance will decrease Outcome: Not Progressing Pt on ordered bedrest.  Problem: Fluid Volume: Goal: Ability to maintain a balanced intake and output will improve Outcome: Not Progressing Pt NPO  Problem: Nutrition: Goal: Adequate nutrition will be maintained Outcome: Not Progressing NPO

## 2014-11-29 ENCOUNTER — Encounter (HOSPITAL_COMMUNITY): Payer: Self-pay | Admitting: Anesthesiology

## 2014-11-29 LAB — CBC WITH DIFFERENTIAL/PLATELET
Basophils Absolute: 0.1 10*3/uL (ref 0.0–0.1)
Basophils Relative: 1 %
Eosinophils Absolute: 0.2 10*3/uL (ref 0.0–0.7)
Eosinophils Relative: 2 %
HCT: 28.3 % — ABNORMAL LOW (ref 39.0–52.0)
Hemoglobin: 9 g/dL — ABNORMAL LOW (ref 13.0–17.0)
Lymphocytes Relative: 17 %
Lymphs Abs: 1.5 10*3/uL (ref 0.7–4.0)
MCH: 30.1 pg (ref 26.0–34.0)
MCHC: 31.8 g/dL (ref 30.0–36.0)
MCV: 94.6 fL (ref 78.0–100.0)
Monocytes Absolute: 0.7 10*3/uL (ref 0.1–1.0)
Monocytes Relative: 8 %
Neutro Abs: 6.5 10*3/uL (ref 1.7–7.7)
Neutrophils Relative %: 72 %
Platelets: 125 10*3/uL — ABNORMAL LOW (ref 150–400)
RBC: 2.99 MIL/uL — ABNORMAL LOW (ref 4.22–5.81)
RDW: 16.7 % — ABNORMAL HIGH (ref 11.5–15.5)
WBC: 9 10*3/uL (ref 4.0–10.5)

## 2014-11-29 LAB — BLOOD GAS, ARTERIAL
Acid-Base Excess: 1.6 mmol/L (ref 0.0–2.0)
Bicarbonate: 26.4 mEq/L — ABNORMAL HIGH (ref 20.0–24.0)
Drawn by: 398991
O2 Content: 4 L/min
O2 Saturation: 89.8 %
Patient temperature: 98.7
TCO2: 27.8 mmol/L (ref 0–100)
pCO2 arterial: 46.8 mmHg — ABNORMAL HIGH (ref 35.0–45.0)
pH, Arterial: 7.37 (ref 7.350–7.450)
pO2, Arterial: 58.7 mmHg — ABNORMAL LOW (ref 80.0–100.0)

## 2014-11-29 LAB — TYPE AND SCREEN
ABO/RH(D): O POS
Antibody Screen: NEGATIVE
Unit division: 0
Unit division: 0

## 2014-11-29 LAB — CBC
HCT: 27.9 % — ABNORMAL LOW (ref 39.0–52.0)
Hemoglobin: 9 g/dL — ABNORMAL LOW (ref 13.0–17.0)
MCH: 30.4 pg (ref 26.0–34.0)
MCHC: 32.3 g/dL (ref 30.0–36.0)
MCV: 94.3 fL (ref 78.0–100.0)
Platelets: 150 10*3/uL (ref 150–400)
RBC: 2.96 MIL/uL — ABNORMAL LOW (ref 4.22–5.81)
RDW: 17 % — ABNORMAL HIGH (ref 11.5–15.5)
WBC: 8.2 10*3/uL (ref 4.0–10.5)

## 2014-11-29 LAB — GLUCOSE, CAPILLARY: Glucose-Capillary: 113 mg/dL — ABNORMAL HIGH (ref 65–99)
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
## 2014-11-29 NOTE — Progress Notes (Signed)
Patient not alert enough to talk with mom about epidural placement.  Dean [name], Nasario Czerniak M, RN

## 2014-11-29 NOTE — Progress Notes (Signed)
Asked patient if he wanted to call his mom to talk about the epidural placement, patient declined for now and would like to try and call her later around 1600.  Hermina BartersBOWMAN, Alexa Golebiewski M, RN

## 2014-11-29 NOTE — Progress Notes (Signed)
Patient feeling SOB, assessment findings expiratory wheezes and rhonchi, PRN neb treatment given, patient switched from 6L [place] to venturi mask 45%, will continue to monitor and assess.  Hermina BartersBOWMAN, Anvita Hirata M, RN

## 2014-11-30 ENCOUNTER — Inpatient Hospital Stay (HOSPITAL_COMMUNITY): Payer: Medicare Other | Admitting: Anesthesiology

## 2014-11-30 ENCOUNTER — Inpatient Hospital Stay (HOSPITAL_COMMUNITY): Payer: Medicare Other

## 2014-11-30 LAB — CBC WITH DIFFERENTIAL/PLATELET
Basophils Absolute: 0.1 10*3/uL (ref 0.0–0.1)
Basophils Relative: 1 %
Eosinophils Absolute: 0.2 10*3/uL (ref 0.0–0.7)
Eosinophils Relative: 2 %
HCT: 27.6 % — ABNORMAL LOW (ref 39.0–52.0)
Hemoglobin: 9.3 g/dL — ABNORMAL LOW (ref 13.0–17.0)
Lymphocytes Relative: 17 %
Lymphs Abs: 1.8 10*3/uL (ref 0.7–4.0)
MCH: 31.2 pg (ref 26.0–34.0)
MCHC: 33.7 g/dL (ref 30.0–36.0)
MCV: 92.6 fL (ref 78.0–100.0)
Monocytes Absolute: 0.9 10*3/uL (ref 0.1–1.0)
Monocytes Relative: 8 %
Neutro Abs: 7.7 10*3/uL (ref 1.7–7.7)
Neutrophils Relative %: 72 %
Platelets: 119 10*3/uL — ABNORMAL LOW (ref 150–400)
RBC: 2.98 MIL/uL — ABNORMAL LOW (ref 4.22–5.81)
RDW: 15.5 % (ref 11.5–15.5)
WBC: 10.7 10*3/uL — ABNORMAL HIGH (ref 4.0–10.5)

## 2014-11-30 LAB — POCT I-STAT 3, ART BLOOD GAS (G3+)
Acid-Base Excess: 3 mmol/L — ABNORMAL HIGH (ref 0.0–2.0)
Bicarbonate: 30.3 meq/L — ABNORMAL HIGH (ref 20.0–24.0)
O2 Saturation: 100 %
Patient temperature: 100.4
TCO2: 32 mmol/L (ref 0–100)
pCO2 arterial: 62.7 mmHg (ref 35.0–45.0)
pH, Arterial: 7.297 — ABNORMAL LOW (ref 7.350–7.450)
pO2, Arterial: 357 mmHg — ABNORMAL HIGH (ref 80.0–100.0)

## 2014-11-30 LAB — COMPREHENSIVE METABOLIC PANEL
ALT: 66 U/L — ABNORMAL HIGH (ref 17–63)
AST: 77 U/L — ABNORMAL HIGH (ref 15–41)
Albumin: 2.4 g/dL — ABNORMAL LOW (ref 3.5–5.0)
Alkaline Phosphatase: 80 U/L (ref 38–126)
Anion gap: 9 (ref 5–15)
BUN: <5 mg/dL — ABNORMAL LOW (ref 6–20)
CO2: 25 mmol/L (ref 22–32)
Calcium: 7.9 mg/dL — ABNORMAL LOW (ref 8.9–10.3)
Chloride: 100 mmol/L — ABNORMAL LOW (ref 101–111)
Creatinine, Ser: 0.6 mg/dL — ABNORMAL LOW (ref 0.61–1.24)
GFR calc Af Amer: >60 mL/min (ref >60)
GFR calc non Af Amer: >60 mL/min (ref >60)
Glucose, Bld: 91 mg/dL (ref 65–99)
Potassium: 4.4 mmol/L (ref 3.5–5.1)
Sodium: 134 mmol/L — ABNORMAL LOW (ref 135–145)
Total Bilirubin: 0.8 mg/dL (ref 0.3–1.2)
Total Protein: 5.8 g/dL — ABNORMAL LOW (ref 6.5–8.1)

## 2014-11-30 MED ORDER — SENNOSIDES-DOCUSATE SODIUM 8.6-50 MG PO TABS: 2.0000 | ORAL_TABLET | Freq: Two times a day (BID) | ORAL | Status: DC | PRN

## 2014-11-30 MED ORDER — MIDAZOLAM HCL 2 MG/2ML IJ SOLN
1.0000 mg | INTRAMUSCULAR | Status: DC | PRN
  Administered 2014-11-30 – 2014-12-05 (×21): 2 mg via INTRAVENOUS
  Filled 2014-11-30 (×23): qty 2

## 2014-11-30 MED ORDER — FENTANYL CITRATE (PF) 100 MCG/2ML IJ SOLN: 100.0000 ug | INTRAMUSCULAR | Status: DC | PRN

## 2014-11-30 MED ORDER — CHLORHEXIDINE GLUCONATE 0.12% ORAL RINSE (MEDLINE KIT)
15.0000 mL | Freq: Two times a day (BID) | OROMUCOSAL | Status: DC
  Administered 2014-11-30 – 2014-12-05 (×11): 15 mL via OROMUCOSAL

## 2014-11-30 MED ORDER — HYDRALAZINE HCL 20 MG/ML IJ SOLN
20.0000 mg | INTRAMUSCULAR | Status: DC | PRN
  Administered 2014-12-07: 20 mg via INTRAVENOUS
  Filled 2014-11-30: qty 1

## 2014-11-30 MED ORDER — DEXMEDETOMIDINE HCL IN NACL 400 MCG/100ML IV SOLN
0.2000 ug/kg/h | INTRAVENOUS | Status: AC
  Administered 2014-11-30 – 2014-12-01 (×5): 0.7 ug/kg/h via INTRAVENOUS
  Filled 2014-11-30: qty 100
  Filled 2014-11-30: qty 50
  Filled 2014-11-30 (×3): qty 100

## 2014-11-30 MED ORDER — LEVETIRACETAM 100 MG/ML PO SOLN
500.0000 mg | Freq: Two times a day (BID) | ORAL | Status: DC
  Administered 2014-11-30 – 2014-12-04 (×10): 500 mg
  Filled 2014-11-30 (×12): qty 5

## 2014-11-30 MED ORDER — DOCUSATE SODIUM 50 MG/5ML PO LIQD: 100.0000 mg | Freq: Two times a day (BID) | ORAL | Status: DC | PRN

## 2014-11-30 MED ORDER — PROPOFOL 10 MG/ML IV BOLUS
INTRAVENOUS | Status: DC | PRN
  Administered 2014-11-30: 200 mg via INTRAVENOUS

## 2014-11-30 MED ORDER — ACETAMINOPHEN 325 MG PO TABS: 650.0000 mg | ORAL_TABLET | Freq: Four times a day (QID) | ORAL | Status: DC | PRN

## 2014-11-30 MED ORDER — ANTISEPTIC ORAL RINSE SOLUTION (CORINZ)
7.0000 mL | Freq: Four times a day (QID) | OROMUCOSAL | Status: DC
  Administered 2014-11-30 – 2014-12-06 (×26): 7 mL via OROMUCOSAL

## 2014-11-30 MED ORDER — ACETAMINOPHEN 160 MG/5ML PO SOLN
650.0000 mg | Freq: Four times a day (QID) | ORAL | Status: DC | PRN
  Administered 2014-11-30 – 2014-12-02 (×2): 650 mg via ORAL
  Filled 2014-11-30 (×2): qty 20.3

## 2014-11-30 MED ORDER — ACETAMINOPHEN 325 MG PO TABS
650.0000 mg | ORAL_TABLET | Freq: Four times a day (QID) | ORAL | Status: DC | PRN
Start: 2014-11-30 — End: 2014-12-11

## 2014-11-30 MED ORDER — DEXMEDETOMIDINE HCL IN NACL 200 MCG/50ML IV SOLN
0.2000 ug/kg/h | INTRAVENOUS | Status: DC
  Administered 2014-11-30: 0.2 ug/kg/h via INTRAVENOUS
  Filled 2014-11-30 (×2): qty 50

## 2014-11-30 MED ORDER — HALOPERIDOL LACTATE 5 MG/ML IJ SOLN
5.0000 mg | Freq: Once | INTRAMUSCULAR | Status: AC
  Administered 2014-11-30: 5 mg via INTRAVENOUS

## 2014-11-30 MED ORDER — SODIUM CHLORIDE 0.9 % IV SOLN
25.0000 ug/h | INTRAVENOUS | Status: DC
  Administered 2014-11-30: 100 ug/h via INTRAVENOUS
  Administered 2014-11-30: 375 ug/h via INTRAVENOUS
  Administered 2014-11-30: 325 ug/h via INTRAVENOUS
  Administered 2014-12-01: 350 ug/h via INTRAVENOUS
  Administered 2014-12-01 – 2014-12-03 (×7): 400 ug/h via INTRAVENOUS
  Administered 2014-12-04: 300 ug/h via INTRAVENOUS
  Administered 2014-12-04: 400 ug/h via INTRAVENOUS
  Administered 2014-12-04: 300 ug/h via INTRAVENOUS
  Administered 2014-12-04: 400 ug/h via INTRAVENOUS
  Administered 2014-12-05: 50 ug/h via INTRAVENOUS
  Administered 2014-12-05: 400 ug/h via INTRAVENOUS
  Filled 2014-11-30 (×19): qty 50

## 2014-11-30 MED ORDER — METOPROLOL TARTRATE 1 MG/ML IV SOLN: 5.0000 mg | Freq: Four times a day (QID) | INTRAVENOUS | Status: DC | PRN

## 2014-11-30 MED ORDER — BISACODYL 10 MG RE SUPP: 10.0000 mg | Freq: Every day | RECTAL | Status: DC | PRN

## 2014-11-30 MED ORDER — SUCCINYLCHOLINE CHLORIDE 20 MG/ML IJ SOLN
INTRAMUSCULAR | Status: DC | PRN
  Administered 2014-11-30: 140 mg via INTRAVENOUS

## 2014-11-30 MED ORDER — FENTANYL CITRATE (PF) 100 MCG/2ML IJ SOLN
100.0000 ug | INTRAMUSCULAR | Status: AC | PRN
  Administered 2014-11-30 – 2014-12-05 (×3): 100 ug via INTRAVENOUS
  Filled 2014-11-30: qty 2

## 2014-11-30 MED ORDER — HALOPERIDOL LACTATE 5 MG/ML IJ SOLN
INTRAMUSCULAR | Status: AC
  Filled 2014-11-30: qty 1
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
## 2014-11-30 NOTE — Progress Notes (Signed)
Patient fighting vent after fentanyl and versed pushes, precedex at 0.7. Chrys Raceralled Wakefield, MD for orders, gave verbal orders for fentanyl drip, continue precedex at 0.7 and versed pushes. Call back if no relief.  Hermina BartersBOWMAN, Ladonna Vanorder M, RN

## 2014-11-30 NOTE — Progress Notes (Addendum)
ETT pulled back 3cm per Dr. Dwain SarnaWakefield.  Also, ventilator changes made, per Dr. Dwain SarnaWakefield, based on ABG results.  Will continue to monitor.

## 2014-11-30 NOTE — Progress Notes (Signed)
Patient re-intubated by anesthesiology per MD orders, patient currently asleep, bilateral wrist restraints on, soft mittens on, fentanyl drip 325 mcgs, precedex 0.7.  Hermina BartersBOWMAN, Zyniah Ferraiolo M, RN

## 2014-11-30 NOTE — Progress Notes (Signed)
Pt tachypneic with expiratory wheezes. RT called for prn breathing treatment

## 2014-11-30 NOTE — Progress Notes (Signed)
RN called RT to patient room due to patient having increased work of breathing and tachypnea. Patient was noted to have a respiratory rate in the 40s and was using accessory muscles.  Gave patient a PRN xopenex treatment.  Will inform MD.  Will continue to monitor.

## 2014-11-30 NOTE — Progress Notes (Signed)
Unable to reach family.  PICC placed as medical necessity per Dr. Emelia LoronMatthew Wakefield.

## 2014-11-30 NOTE — Plan of Care (Signed)
Problem: Pain Managment: Goal: General experience of comfort will improve Outcome: Progressing Pain better controlled with full dilaudid pca  Problem: Activity: Goal: Risk for activity intolerance will decrease Outcome: Not Progressing Pt on bedrest and has difficulty tolerating repositioning r/t pain   Problem: Nutrition: Goal: Adequate nutrition will be maintained Outcome: Progressing Pt started on clear liquid diet and tolerated well
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
## 2014-11-30 NOTE — Progress Notes (Signed)
Dr. Lindie SpruceWyatt notified pt being tachypneic with increasing agitation. Pt maintaining oxygen sat, but continues to pull at lines and oxygen. Ordered received for haldol 5mg  IV x1. Will continue to monitor

## 2014-11-30 NOTE — Progress Notes (Signed)
Heard ventilator going off from outside room, upon arrival to patients room, patient self extubated while in bilateral wrist restraints, RT at bedside with non-re breather, pt VS stable, MD called and notified, orders for re-intubation, anesthesiology called for re-intubation.  Hermina BartersBOWMAN, Cornellius Kropp M, RN

## 2014-11-30 NOTE — Anesthesia Preprocedure Evaluation (Signed)
Anesthesia Evaluation  Patient identified by MRN, date of birth, ID band  Reviewed: Allergy & Precautions, NPO status   Airway      Mouth opening: Limited Mouth Opening  Dental   Pulmonary Current Smoker,    breath sounds clear to auscultation       Cardiovascular  Rhythm:Regular Rate:Normal     Neuro/Psych    GI/Hepatic   Endo/Other  Morbid obesity  Renal/GU      Musculoskeletal   Abdominal   Peds  Hematology   Anesthesia Other Findings Pt self  extubated from being intubated by me earlier today. Sedated.  Reproductive/Obstetrics                             Anesthesia Physical Anesthesia Plan  ASA: III and emergent  Anesthesia Plan: General   Post-op Pain Management:    Induction: Intravenous  Airway Management Planned: Oral ETT and Video Laryngoscope Planned  Additional Equipment:   Intra-op Plan:   Post-operative Plan:   Informed Consent:   Plan Discussed with: Anesthesiologist and CRNA  Anesthesia Plan Comments:         Anesthesia Quick Evaluation

## 2014-12-01 LAB — CBC
HCT: 28.7 % — ABNORMAL LOW (ref 39.0–52.0)
Hemoglobin: 9.4 g/dL — ABNORMAL LOW (ref 13.0–17.0)
MCH: 29.8 pg (ref 26.0–34.0)
MCHC: 32.8 g/dL (ref 30.0–36.0)
MCV: 91.1 fL (ref 78.0–100.0)
Platelets: 159 10*3/uL (ref 150–400)
RBC: 3.15 MIL/uL — ABNORMAL LOW (ref 4.22–5.81)
RDW: 15 % (ref 11.5–15.5)
WBC: 10.2 10*3/uL (ref 4.0–10.5)

## 2014-12-01 LAB — BASIC METABOLIC PANEL
Anion gap: 5 (ref 5–15)
BUN: <5 mg/dL — ABNORMAL LOW (ref 6–20)
CO2: 28 mmol/L (ref 22–32)
Calcium: 7.7 mg/dL — ABNORMAL LOW (ref 8.9–10.3)
Chloride: 104 mmol/L (ref 101–111)
Creatinine, Ser: 0.57 mg/dL — ABNORMAL LOW (ref 0.61–1.24)
GFR calc Af Amer: >60 mL/min (ref >60)
GFR calc non Af Amer: >60 mL/min (ref >60)
Glucose, Bld: 124 mg/dL — ABNORMAL HIGH (ref 65–99)
Potassium: 3.9 mmol/L (ref 3.5–5.1)
Sodium: 137 mmol/L (ref 135–145)

## 2014-12-01 MED ORDER — IPRATROPIUM-ALBUTEROL 0.5-2.5 (3) MG/3ML IN SOLN
3.0000 mL | Freq: Four times a day (QID) | RESPIRATORY_TRACT | Status: DC
  Administered 2014-12-01 – 2014-12-03 (×11): 3 mL via RESPIRATORY_TRACT
  Filled 2014-12-01 (×11): qty 3

## 2014-12-01 MED ORDER — PRO-STAT SUGAR FREE PO LIQD
30.0000 mL | Freq: Four times a day (QID) | ORAL | Status: DC
  Administered 2014-12-01 – 2014-12-04 (×16): 30 mL
  Filled 2014-12-01 (×24): qty 30

## 2014-12-01 MED ORDER — METOPROLOL TARTRATE 25 MG/10 ML ORAL SUSPENSION
25.0000 mg | Freq: Two times a day (BID) | ORAL | Status: DC
  Administered 2014-12-01 (×2): 25 mg via ORAL
  Filled 2014-12-01 (×2): qty 10

## 2014-12-01 MED ORDER — CLONIDINE HCL 0.1 MG/24HR TD PTWK
0.1000 mg | MEDICATED_PATCH | TRANSDERMAL | Status: DC
  Administered 2014-12-01 – 2014-12-08 (×2): 0.1 mg via TRANSDERMAL
  Filled 2014-12-01 (×2): qty 1

## 2014-12-01 MED ORDER — VITAMIN C 500 MG PO TABS
1000.0000 mg | ORAL_TABLET | Freq: Three times a day (TID) | ORAL | Status: DC
Start: 2014-12-01 — End: 2014-12-08
  Administered 2014-12-01 – 2014-12-07 (×18): 1000 mg
  Filled 2014-12-01 (×22): qty 2

## 2014-12-01 MED ORDER — SELENIUM 50 MCG PO TABS
200.0000 ug | ORAL_TABLET | Freq: Every day | ORAL | Status: DC
  Administered 2014-12-01 – 2014-12-06 (×5): 200 ug
  Filled 2014-12-01 (×7): qty 4

## 2014-12-01 MED ORDER — DEXMEDETOMIDINE HCL IN NACL 400 MCG/100ML IV SOLN
0.4000 ug/kg/h | INTRAVENOUS | Status: AC
  Administered 2014-12-02: 1.1 ug/kg/h via INTRAVENOUS
  Filled 2014-12-01 (×6): qty 100

## 2014-12-01 MED ORDER — PIVOT 1.5 CAL PO LIQD
1000.0000 mL | ORAL | Status: DC
  Filled 2014-12-01: qty 1000

## 2014-12-01 MED ORDER — PIVOT 1.5 CAL PO LIQD
1000.0000 mL | ORAL | Status: DC
  Administered 2014-12-02 – 2014-12-03 (×2): 1000 mL
  Filled 2014-12-01 (×5): qty 1000

## 2014-12-01 MED ORDER — PIVOT 1.5 CAL PO LIQD
1000.0000 mL | ORAL | Status: DC
  Filled 2014-12-01 (×2): qty 1000

## 2014-12-01 NOTE — Progress Notes (Signed)
Pt placed on PS trial 12/5 at this time tolerating well, continue PS as tolerated today but no plan to extubate per Dr. Janee Mornhompson. Rt will continue to monitor.

## 2014-12-01 NOTE — Plan of Care (Signed)
Problem: Safety: Goal: Ability to remain free from injury will improve Outcome: Progressing Sitter present at bedside. Bed alarm and bilateral wrist restraints on.

## 2014-12-01 NOTE — Progress Notes (Signed)
Pt placed back on full support due to increased agitation. Rt will continue to monitor.
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
## 2014-12-01 NOTE — Clinical Documentation Improvement (Signed)
General Surgery Trauma Pulmonology  Based on the clinical findings below, please document in next Note any associated diagnoses/conditions the patient has or may have to better illustrate severity of illness/risk of mortality.   Liver laceration  Hemoperitoneum  Bilateral lung contusion  Other  Clinically Undetermined  Supporting Information: -- CT abdomen 11/17 states "A small liver laceration is identified in the left hepatic lobe", "hemoperitoneum" and "patchey areas of probably lung contusion bilaterally" and "small area of retroperitoneal hemorrhage"   Please exercise your independent, professional judgment when responding. A specific answer is not anticipated or expected.  Thank You, Beverley FiedlerLaurie E Lissa Rowles RN [facility] [employer] [facility] [place] [phone]
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
## 2014-12-02 ENCOUNTER — Inpatient Hospital Stay (HOSPITAL_COMMUNITY): Payer: Medicare Other

## 2014-12-02 LAB — GLUCOSE, CAPILLARY
Glucose-Capillary: 108 mg/dL — ABNORMAL HIGH (ref 65–99)
Glucose-Capillary: 109 mg/dL — ABNORMAL HIGH (ref 65–99)
Glucose-Capillary: 120 mg/dL — ABNORMAL HIGH (ref 65–99)
Glucose-Capillary: 120 mg/dL — ABNORMAL HIGH (ref 65–99)
Glucose-Capillary: 122 mg/dL — ABNORMAL HIGH (ref 65–99)
Glucose-Capillary: 129 mg/dL — ABNORMAL HIGH (ref 65–99)
Glucose-Capillary: 136 mg/dL — ABNORMAL HIGH (ref 65–99)

## 2014-12-02 LAB — BASIC METABOLIC PANEL
Anion gap: 7 (ref 5–15)
BUN: 6 mg/dL (ref 6–20)
CO2: 28 mmol/L (ref 22–32)
Calcium: 7.9 mg/dL — ABNORMAL LOW (ref 8.9–10.3)
Chloride: 101 mmol/L (ref 101–111)
Creatinine, Ser: 0.52 mg/dL — ABNORMAL LOW (ref 0.61–1.24)
GFR calc Af Amer: >60 mL/min (ref >60)
GFR calc non Af Amer: >60 mL/min (ref >60)
Glucose, Bld: 136 mg/dL — ABNORMAL HIGH (ref 65–99)
Potassium: 3.9 mmol/L (ref 3.5–5.1)
Sodium: 136 mmol/L (ref 135–145)

## 2014-12-02 LAB — CBC
HCT: 29.1 % — ABNORMAL LOW (ref 39.0–52.0)
Hemoglobin: 9.5 g/dL — ABNORMAL LOW (ref 13.0–17.0)
MCH: 30.1 pg (ref 26.0–34.0)
MCHC: 32.6 g/dL (ref 30.0–36.0)
MCV: 92.1 fL (ref 78.0–100.0)
Platelets: 210 10*3/uL (ref 150–400)
RBC: 3.16 MIL/uL — ABNORMAL LOW (ref 4.22–5.81)
RDW: 14.9 % (ref 11.5–15.5)
WBC: 10 10*3/uL (ref 4.0–10.5)

## 2014-12-02 MED ORDER — METOPROLOL TARTRATE 25 MG/10 ML ORAL SUSPENSION
50.0000 mg | Freq: Two times a day (BID) | ORAL | Status: DC
  Administered 2014-12-02 – 2014-12-06 (×9): 50 mg
  Filled 2014-12-02 (×14): qty 20

## 2014-12-02 MED ORDER — CLONAZEPAM 0.1 MG/ML ORAL SUSPENSION: 0.5000 mg | Freq: Two times a day (BID) | ORAL | Status: DC

## 2014-12-02 MED ORDER — POTASSIUM CHLORIDE 10 MEQ/50ML IV SOLN
10.0000 meq | INTRAVENOUS | Status: AC
  Administered 2014-12-02 (×2): 10 meq via INTRAVENOUS
  Filled 2014-12-02 (×2): qty 50

## 2014-12-02 MED ORDER — CLONAZEPAM 0.5 MG PO TABS
0.5000 mg | ORAL_TABLET | Freq: Two times a day (BID) | ORAL | Status: DC
  Administered 2014-12-02 – 2014-12-04 (×6): 0.5 mg
  Filled 2014-12-02 (×7): qty 1

## 2014-12-02 MED ORDER — FUROSEMIDE 10 MG/ML IJ SOLN
20.0000 mg | Freq: Two times a day (BID) | INTRAMUSCULAR | Status: AC
  Administered 2014-12-02 (×2): 20 mg via INTRAVENOUS
  Filled 2014-12-02 (×2): qty 2

## 2014-12-02 MED ORDER — PIPERACILLIN-TAZOBACTAM 3.375 G IVPB
3.3750 g | Freq: Three times a day (TID) | INTRAVENOUS | Status: DC
  Administered 2014-12-02 – 2014-12-06 (×12): 3.375 g via INTRAVENOUS
  Filled 2014-12-02 (×14): qty 50

## 2014-12-02 MED ORDER — DEXMEDETOMIDINE HCL IN NACL 400 MCG/100ML IV SOLN
0.4000 ug/kg/h | INTRAVENOUS | Status: DC
  Administered 2014-12-02 – 2014-12-05 (×19): 1.2 ug/kg/h via INTRAVENOUS
  Administered 2014-12-05: 0.2 ug/kg/h via INTRAVENOUS
  Filled 2014-12-02 (×22): qty 100
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
## 2014-12-02 NOTE — Progress Notes (Signed)
Temp 101.1 at 4p, tylenol 650 administered.  530p, temp at 101.5.  Contacted Dr. Janee Mornhompson, advised he would order respiratory cx.
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
## 2014-12-02 NOTE — Progress Notes (Signed)
PS trial attempted X2 at this time, failed due to increased WOB, inc RR >35, decreased VT <350 on PS 12, will try again later today as tolerated, RT will continue to monitor

## 2014-12-02 NOTE — Progress Notes (Signed)
Pt failed 3rd attempt PS trial at this time due to inc WOB, inc RR >32, decreased VT's, RT will monitor, placed back on full support tolerating well

## 2014-12-03 ENCOUNTER — Inpatient Hospital Stay (HOSPITAL_COMMUNITY): Payer: Medicare Other

## 2014-12-03 LAB — CBC
HCT: 28.6 % — ABNORMAL LOW (ref 39.0–52.0)
Hemoglobin: 9.5 g/dL — ABNORMAL LOW (ref 13.0–17.0)
MCH: 30.5 pg (ref 26.0–34.0)
MCHC: 33.2 g/dL (ref 30.0–36.0)
MCV: 92 fL (ref 78.0–100.0)
Platelets: 252 10*3/uL (ref 150–400)
RBC: 3.11 MIL/uL — ABNORMAL LOW (ref 4.22–5.81)
RDW: 15.2 % (ref 11.5–15.5)
WBC: 11.1 10*3/uL — ABNORMAL HIGH (ref 4.0–10.5)

## 2014-12-03 LAB — GLUCOSE, CAPILLARY
Glucose-Capillary: 115 mg/dL — ABNORMAL HIGH (ref 65–99)
Glucose-Capillary: 115 mg/dL — ABNORMAL HIGH (ref 65–99)
Glucose-Capillary: 103 mg/dL — ABNORMAL HIGH (ref 65–99)
Glucose-Capillary: 106 mg/dL — ABNORMAL HIGH (ref 65–99)
Glucose-Capillary: 133 mg/dL — ABNORMAL HIGH (ref 65–99)

## 2014-12-03 LAB — BASIC METABOLIC PANEL
Anion gap: 6 (ref 5–15)
BUN: 10 mg/dL (ref 6–20)
CO2: 29 mmol/L (ref 22–32)
Calcium: 8 mg/dL — ABNORMAL LOW (ref 8.9–10.3)
Chloride: 98 mmol/L — ABNORMAL LOW (ref 101–111)
Creatinine, Ser: 0.48 mg/dL — ABNORMAL LOW (ref 0.61–1.24)
GFR calc Af Amer: >60 mL/min (ref >60)
GFR calc non Af Amer: >60 mL/min (ref >60)
Glucose, Bld: 127 mg/dL — ABNORMAL HIGH (ref 65–99)
Potassium: 4.1 mmol/L (ref 3.5–5.1)
Sodium: 133 mmol/L — ABNORMAL LOW (ref 135–145)

## 2014-12-03 LAB — URINE CULTURE
Culture: NO GROWTH
Special Requests: NORMAL

## 2014-12-03 MED ORDER — VANCOMYCIN HCL 10 G IV SOLR
2500.0000 mg | Freq: Once | INTRAVENOUS | Status: AC
  Administered 2014-12-03: 2500 mg via INTRAVENOUS
  Filled 2014-12-03: qty 2500

## 2014-12-03 MED ORDER — IOHEXOL 300 MG/ML  SOLN
100.0000 mL | Freq: Once | INTRAMUSCULAR | Status: AC | PRN
  Administered 2014-12-03: 75 mL via INTRAVENOUS

## 2014-12-03 MED ORDER — VANCOMYCIN HCL IN DEXTROSE 1-5 GM/200ML-% IV SOLN
1000.0000 mg | Freq: Three times a day (TID) | INTRAVENOUS | Status: DC
  Administered 2014-12-03 – 2014-12-04 (×4): 1000 mg via INTRAVENOUS
  Filled 2014-12-03 (×5): qty 200

## 2014-12-03 MED ORDER — IPRATROPIUM-ALBUTEROL 0.5-2.5 (3) MG/3ML IN SOLN
3.0000 mL | Freq: Three times a day (TID) | RESPIRATORY_TRACT | Status: DC
Start: 2014-12-04 — End: 2014-12-05
  Administered 2014-12-04 – 2014-12-05 (×4): 3 mL via RESPIRATORY_TRACT
  Filled 2014-12-03 (×4): qty 3
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
## 2014-12-04 DIAGNOSIS — S2249XA Multiple fractures of ribs, unspecified side, initial encounter for closed fracture: Secondary | ICD-10-CM | POA: Diagnosis present

## 2014-12-04 LAB — CBC WITH DIFFERENTIAL/PLATELET
Basophils Absolute: 0 10*3/uL (ref 0.0–0.1)
Basophils Relative: 1 %
Eosinophils Absolute: 0.5 10*3/uL (ref 0.0–0.7)
Eosinophils Relative: 6 %
HCT: 23.2 % — ABNORMAL LOW (ref 39.0–52.0)
Hemoglobin: 7.5 g/dL — ABNORMAL LOW (ref 13.0–17.0)
Lymphocytes Relative: 14 %
Lymphs Abs: 1.1 10*3/uL (ref 0.7–4.0)
MCH: 29.9 pg (ref 26.0–34.0)
MCHC: 32.3 g/dL (ref 30.0–36.0)
MCV: 92.4 fL (ref 78.0–100.0)
Monocytes Absolute: 0.7 10*3/uL (ref 0.1–1.0)
Monocytes Relative: 9 %
Neutro Abs: 5.9 10*3/uL (ref 1.7–7.7)
Neutrophils Relative %: 71 %
Platelets: 251 10*3/uL (ref 150–400)
RBC: 2.51 MIL/uL — ABNORMAL LOW (ref 4.22–5.81)
RDW: 15.3 % (ref 11.5–15.5)
WBC: 8.2 10*3/uL (ref 4.0–10.5)

## 2014-12-04 LAB — BASIC METABOLIC PANEL
Anion gap: 4 — ABNORMAL LOW (ref 5–15)
BUN: 7 mg/dL (ref 6–20)
CO2: 23 mmol/L (ref 22–32)
Calcium: 6.2 mg/dL — CL (ref 8.9–10.3)
Chloride: 106 mmol/L (ref 101–111)
Creatinine, Ser: 0.41 mg/dL — ABNORMAL LOW (ref 0.61–1.24)
GFR calc Af Amer: >60 mL/min (ref >60)
GFR calc non Af Amer: >60 mL/min (ref >60)
Glucose, Bld: 105 mg/dL — ABNORMAL HIGH (ref 65–99)
Potassium: 3.1 mmol/L — ABNORMAL LOW (ref 3.5–5.1)
Sodium: 133 mmol/L — ABNORMAL LOW (ref 135–145)

## 2014-12-04 LAB — GLUCOSE, CAPILLARY
Glucose-Capillary: 108 mg/dL — ABNORMAL HIGH (ref 65–99)
Glucose-Capillary: 110 mg/dL — ABNORMAL HIGH (ref 65–99)
Glucose-Capillary: 111 mg/dL — ABNORMAL HIGH (ref 65–99)
Glucose-Capillary: 117 mg/dL — ABNORMAL HIGH (ref 65–99)
Glucose-Capillary: 118 mg/dL — ABNORMAL HIGH (ref 65–99)
Glucose-Capillary: 121 mg/dL — ABNORMAL HIGH (ref 65–99)
Glucose-Capillary: 122 mg/dL — ABNORMAL HIGH (ref 65–99)

## 2014-12-04 LAB — POCT I-STAT, CHEM 8
BUN: 11 mg/dL (ref 6–20)
Calcium, Ion: 1.2 mmol/L (ref 1.12–1.23)
Chloride: 96 mmol/L — ABNORMAL LOW (ref 101–111)
Creatinine, Ser: 0.6 mg/dL — ABNORMAL LOW (ref 0.61–1.24)
Glucose, Bld: 135 mg/dL — ABNORMAL HIGH (ref 65–99)
HCT: 28 % — ABNORMAL LOW (ref 39.0–52.0)
Hemoglobin: 9.5 g/dL — ABNORMAL LOW (ref 13.0–17.0)
Potassium: 4.1 mmol/L (ref 3.5–5.1)
Sodium: 134 mmol/L — ABNORMAL LOW (ref 135–145)
TCO2: 28 mmol/L (ref 0–100)

## 2014-12-04 LAB — VANCOMYCIN, TROUGH: Vancomycin Tr: 10 ug/mL (ref 10.0–20.0)

## 2014-12-04 MED ORDER — VANCOMYCIN HCL 10 G IV SOLR
1500.0000 mg | Freq: Three times a day (TID) | INTRAVENOUS | Status: DC
  Administered 2014-12-05 (×2): 1500 mg via INTRAVENOUS
  Filled 2014-12-04 (×4): qty 1500

## 2014-12-04 MED ORDER — VANCOMYCIN HCL 500 MG IV SOLR
500.0000 mg | Freq: Once | INTRAVENOUS | Status: AC
  Administered 2014-12-04: 500 mg via INTRAVENOUS
  Filled 2014-12-04: qty 500

## 2014-12-04 MED ORDER — FENTANYL 25 MCG/HR TD PT72
50.0000 ug | MEDICATED_PATCH | TRANSDERMAL | Status: DC
  Administered 2014-12-04 – 2014-12-07 (×2): 50 ug via TRANSDERMAL
  Filled 2014-12-04 (×2): qty 2

## 2014-12-04 MED ORDER — PIVOT 1.5 CAL PO LIQD
1000.0000 mL | ORAL | Status: DC
  Administered 2014-12-04: 1000 mL
  Filled 2014-12-04 (×4): qty 1000

## 2014-12-04 MED ORDER — QUETIAPINE FUMARATE 50 MG PO TABS
50.0000 mg | ORAL_TABLET | Freq: Two times a day (BID) | ORAL | Status: DC
  Administered 2014-12-04 (×2): 50 mg via ORAL
  Filled 2014-12-04 (×4): qty 1
  Filled 2014-12-04: qty 2

## 2014-12-04 NOTE — Progress Notes (Signed)
Critical calcium level received, multiple results from bmet significantly skewed from yesterdays values.  I-stat check shows normal values consistent with prior lab results.
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
## 2014-12-05 ENCOUNTER — Inpatient Hospital Stay (HOSPITAL_COMMUNITY): Payer: Medicare Other

## 2014-12-05 LAB — GLUCOSE, CAPILLARY
Glucose-Capillary: 62 mg/dL — ABNORMAL LOW (ref 65–99)
Glucose-Capillary: 68 mg/dL (ref 65–99)
Glucose-Capillary: 79 mg/dL (ref 65–99)
Glucose-Capillary: 81 mg/dL (ref 65–99)
Glucose-Capillary: 83 mg/dL (ref 65–99)
Glucose-Capillary: 92 mg/dL (ref 65–99)

## 2014-12-05 LAB — BASIC METABOLIC PANEL
Anion gap: 3 — ABNORMAL LOW (ref 5–15)
BUN: 12 mg/dL (ref 6–20)
CO2: 30 mmol/L (ref 22–32)
Calcium: 7.8 mg/dL — ABNORMAL LOW (ref 8.9–10.3)
Chloride: 102 mmol/L (ref 101–111)
Creatinine, Ser: 0.57 mg/dL — ABNORMAL LOW (ref 0.61–1.24)
GFR calc Af Amer: >60 mL/min (ref >60)
GFR calc non Af Amer: >60 mL/min (ref >60)
Glucose, Bld: 122 mg/dL — ABNORMAL HIGH (ref 65–99)
Potassium: 3.9 mmol/L (ref 3.5–5.1)
Sodium: 135 mmol/L (ref 135–145)

## 2014-12-05 LAB — CBC
HCT: 28.3 % — ABNORMAL LOW (ref 39.0–52.0)
Hemoglobin: 8.8 g/dL — ABNORMAL LOW (ref 13.0–17.0)
MCH: 29 pg (ref 26.0–34.0)
MCHC: 31.1 g/dL (ref 30.0–36.0)
MCV: 93.4 fL (ref 78.0–100.0)
Platelets: 370 10*3/uL (ref 150–400)
RBC: 3.03 MIL/uL — ABNORMAL LOW (ref 4.22–5.81)
RDW: 15.9 % — ABNORMAL HIGH (ref 11.5–15.5)
WBC: 9.2 10*3/uL (ref 4.0–10.5)

## 2014-12-05 LAB — VANCOMYCIN, TROUGH: Vancomycin Tr: 20 ug/mL (ref 10.0–20.0)

## 2014-12-05 MED ORDER — ALBUTEROL SULFATE (2.5 MG/3ML) 0.083% IN NEBU: 2.5000 mg | INHALATION_SOLUTION | RESPIRATORY_TRACT | Status: DC | PRN

## 2014-12-05 MED ORDER — IPRATROPIUM-ALBUTEROL 0.5-2.5 (3) MG/3ML IN SOLN
3.0000 mL | Freq: Two times a day (BID) | RESPIRATORY_TRACT | Status: DC
Start: 2014-12-05 — End: 2014-12-11
  Administered 2014-12-05 – 2014-12-11 (×11): 3 mL via RESPIRATORY_TRACT
  Filled 2014-12-05 (×12): qty 3

## 2014-12-05 MED ORDER — VANCOMYCIN HCL 10 G IV SOLR
1250.0000 mg | Freq: Three times a day (TID) | INTRAVENOUS | Status: DC
  Administered 2014-12-05 – 2014-12-06 (×3): 1250 mg via INTRAVENOUS
  Filled 2014-12-05 (×5): qty 1250

## 2014-12-05 MED ORDER — SODIUM CHLORIDE 0.9 % IV SOLN
500.0000 mg | Freq: Two times a day (BID) | INTRAVENOUS | Status: DC
  Administered 2014-12-05 – 2014-12-09 (×9): 500 mg via INTRAVENOUS
  Filled 2014-12-05 (×10): qty 5
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
## 2014-12-05 NOTE — Progress Notes (Signed)
Called by RN to pt.'s room due to pt. Self extubating. Upon arrival to pt.'s room pt. Was on a 100% NRB & SAT's were 100% and all other vial signs within normal range. Vent is currently in pt.'s room on standby. RT will continue to monitor pt.
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
## 2014-12-06 ENCOUNTER — Inpatient Hospital Stay (HOSPITAL_COMMUNITY): Payer: Medicare Other

## 2014-12-06 LAB — CULTURE, RESPIRATORY: Special Requests: NORMAL

## 2014-12-06 LAB — CBC
HCT: 28.4 % — ABNORMAL LOW (ref 39.0–52.0)
Hemoglobin: 9.1 g/dL — ABNORMAL LOW (ref 13.0–17.0)
MCH: 30.4 pg (ref 26.0–34.0)
MCHC: 32 g/dL (ref 30.0–36.0)
MCV: 95 fL (ref 78.0–100.0)
Platelets: 425 10*3/uL — ABNORMAL HIGH (ref 150–400)
RBC: 2.99 MIL/uL — ABNORMAL LOW (ref 4.22–5.81)
RDW: 16.5 % — ABNORMAL HIGH (ref 11.5–15.5)
WBC: 11.1 10*3/uL — ABNORMAL HIGH (ref 4.0–10.5)

## 2014-12-06 LAB — GLUCOSE, CAPILLARY
Glucose-Capillary: 70 mg/dL (ref 65–99)
Glucose-Capillary: 76 mg/dL (ref 65–99)
Glucose-Capillary: 77 mg/dL (ref 65–99)
Glucose-Capillary: 84 mg/dL (ref 65–99)
Glucose-Capillary: 84 mg/dL (ref 65–99)
Glucose-Capillary: 91 mg/dL (ref 65–99)

## 2014-12-06 LAB — CULTURE, RESPIRATORY W GRAM STAIN

## 2014-12-06 MED ORDER — LORAZEPAM 2 MG/ML IJ SOLN
2.0000 mg | INTRAMUSCULAR | Status: DC | PRN
  Administered 2014-12-06 – 2014-12-07 (×6): 3 mg via INTRAVENOUS
  Administered 2014-12-07 (×2): 2 mg via INTRAVENOUS
  Administered 2014-12-07: 3 mg via INTRAVENOUS
  Administered 2014-12-07: 2 mg via INTRAVENOUS
  Administered 2014-12-08 (×2): 3 mg via INTRAVENOUS
  Administered 2014-12-08 – 2014-12-11 (×6): 2 mg via INTRAVENOUS
  Filled 2014-12-06 (×2): qty 2
  Filled 2014-12-06 (×2): qty 1
  Filled 2014-12-06: qty 2
  Filled 2014-12-06 (×4): qty 1
  Filled 2014-12-06: qty 2
  Filled 2014-12-06 (×2): qty 1
  Filled 2014-12-06 (×4): qty 2
  Filled 2014-12-06: qty 1
  Filled 2014-12-06: qty 2

## 2014-12-06 MED ORDER — ALTEPLASE 2 MG IJ SOLR
2.0000 mg | Freq: Once | INTRAMUSCULAR | Status: DC
  Administered 2014-12-06: 2 mg

## 2014-12-06 MED ORDER — RESOURCE THICKENUP CLEAR PO POWD
ORAL | Status: DC | PRN
  Filled 2014-12-06: qty 125

## 2014-12-06 MED ORDER — SODIUM CHLORIDE 0.9 % IJ SOLN: 9.0000 mL | INTRAMUSCULAR | Status: DC | PRN

## 2014-12-06 MED ORDER — DIPHENHYDRAMINE HCL 12.5 MG/5ML PO ELIX: 12.5000 mg | ORAL_SOLUTION | Freq: Four times a day (QID) | ORAL | Status: DC | PRN

## 2014-12-06 MED ORDER — MORPHINE SULFATE 2 MG/ML IV SOLN
INTRAVENOUS | Status: DC
  Administered 2014-12-06 (×2): 9 mg via INTRAVENOUS
  Administered 2014-12-06: 11:00:00 via INTRAVENOUS
  Administered 2014-12-06: 3 mg via INTRAVENOUS
  Administered 2014-12-06: 20 mg via INTRAVENOUS
  Administered 2014-12-07: 10 mg via INTRAVENOUS
  Administered 2014-12-07: 13 mg via INTRAVENOUS
  Administered 2014-12-07: 10 mg via INTRAVENOUS
  Administered 2014-12-07: 4 mg via INTRAVENOUS
  Administered 2014-12-07: 16:00:00 via INTRAVENOUS
  Administered 2014-12-07: 14 mg via INTRAVENOUS
  Administered 2014-12-08: 5 mg via INTRAVENOUS
  Administered 2014-12-08: 8 mg via INTRAVENOUS
  Administered 2014-12-08: 7 mg via INTRAVENOUS
  Filled 2014-12-06 (×3): qty 25

## 2014-12-06 MED ORDER — ALTEPLASE 2 MG IJ SOLR
6.0000 mg | Freq: Once | INTRAMUSCULAR | Status: AC
  Administered 2014-12-06 (×2): 2 mg
  Filled 2014-12-06: qty 6

## 2014-12-06 MED ORDER — DIPHENHYDRAMINE HCL 50 MG/ML IJ SOLN: 12.5000 mg | Freq: Four times a day (QID) | INTRAMUSCULAR | Status: DC | PRN

## 2014-12-06 MED ORDER — CEFAZOLIN SODIUM-DEXTROSE 2-3 GM-% IV SOLR
2.0000 g | Freq: Three times a day (TID) | INTRAVENOUS | Status: DC
  Administered 2014-12-06 – 2014-12-10 (×11): 2 g via INTRAVENOUS
  Filled 2014-12-06 (×13): qty 50

## 2014-12-06 MED ORDER — NALOXONE HCL 0.4 MG/ML IJ SOLN: 0.4000 mg | INTRAMUSCULAR | Status: DC | PRN
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
## 2014-12-06 NOTE — Progress Notes (Signed)
MBSS complete. Full report located under chart review in imaging section. Adora Yeh, MA CCC-SLP [phone]  
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
## 2014-12-07 ENCOUNTER — Inpatient Hospital Stay (HOSPITAL_COMMUNITY): Payer: Medicare Other

## 2014-12-07 LAB — CBC
HCT: 27.9 % — ABNORMAL LOW (ref 39.0–52.0)
Hemoglobin: 9.2 g/dL — ABNORMAL LOW (ref 13.0–17.0)
MCH: 30.5 pg (ref 26.0–34.0)
MCHC: 33 g/dL (ref 30.0–36.0)
MCV: 92.4 fL (ref 78.0–100.0)
Platelets: 449 10*3/uL — ABNORMAL HIGH (ref 150–400)
RBC: 3.02 MIL/uL — ABNORMAL LOW (ref 4.22–5.81)
RDW: 16.3 % — ABNORMAL HIGH (ref 11.5–15.5)
WBC: 12.6 10*3/uL — ABNORMAL HIGH (ref 4.0–10.5)

## 2014-12-07 LAB — GLUCOSE, CAPILLARY: Glucose-Capillary: 94 mg/dL (ref 65–99)

## 2014-12-07 MED ORDER — PANTOPRAZOLE SODIUM 40 MG PO TBEC
40.0000 mg | DELAYED_RELEASE_TABLET | Freq: Every day | ORAL | Status: DC
  Administered 2014-12-07 – 2014-12-09 (×3): 40 mg via ORAL
  Filled 2014-12-07 (×4): qty 1

## 2014-12-07 MED ORDER — METOPROLOL TARTRATE 25 MG PO TABS
50.0000 mg | ORAL_TABLET | Freq: Two times a day (BID) | ORAL | Status: DC
  Administered 2014-12-07 – 2014-12-11 (×9): 50 mg
  Filled 2014-12-07 (×10): qty 2

## 2014-12-07 NOTE — Progress Notes (Signed)
 2mg  Morphine PCA wasted in sink with Laverna PeaceShanna Hintz, RN Darrel HooverWilson,Annaliz Aven S 4:37 PM
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
## 2014-12-08 LAB — CBC
HCT: 28.5 % — ABNORMAL LOW (ref 39.0–52.0)
Hemoglobin: 9.4 g/dL — ABNORMAL LOW (ref 13.0–17.0)
MCH: 30.6 pg (ref 26.0–34.0)
MCHC: 33 g/dL (ref 30.0–36.0)
MCV: 92.8 fL (ref 78.0–100.0)
Platelets: 511 10*3/uL — ABNORMAL HIGH (ref 150–400)
RBC: 3.07 MIL/uL — ABNORMAL LOW (ref 4.22–5.81)
RDW: 16.7 % — ABNORMAL HIGH (ref 11.5–15.5)
WBC: 12.8 10*3/uL — ABNORMAL HIGH (ref 4.0–10.5)

## 2014-12-08 MED ORDER — MORPHINE SULFATE (PF) 2 MG/ML IV SOLN
2.0000 mg | INTRAVENOUS | Status: DC | PRN
  Administered 2014-12-09 – 2014-12-10 (×3): 2 mg via INTRAVENOUS
  Filled 2014-12-08: qty 1
  Filled 2014-12-08: qty 2
  Filled 2014-12-08 (×2): qty 1

## 2014-12-08 MED ORDER — FENTANYL 25 MCG/HR TD PT72
25.0000 ug | MEDICATED_PATCH | TRANSDERMAL | Status: DC
  Filled 2014-12-08: qty 1

## 2014-12-08 MED ORDER — PRO-STAT SUGAR FREE PO LIQD
30.0000 mL | Freq: Two times a day (BID) | ORAL | Status: DC
  Administered 2014-12-10: 30 mL via ORAL
  Filled 2014-12-08 (×3): qty 30

## 2014-12-08 MED ORDER — OXYCODONE-ACETAMINOPHEN 7.5-325 MG PO TABS
1.0000 | ORAL_TABLET | ORAL | Status: DC | PRN
  Administered 2014-12-08 – 2014-12-09 (×4): 1 via ORAL
  Administered 2014-12-09: 2 via ORAL
  Administered 2014-12-09 (×2): 1 via ORAL
  Administered 2014-12-09 – 2014-12-10 (×3): 2 via ORAL
  Filled 2014-12-08: qty 2
  Filled 2014-12-08: qty 1
  Filled 2014-12-08 (×2): qty 2
  Filled 2014-12-08: qty 1
  Filled 2014-12-08: qty 2
  Filled 2014-12-08 (×3): qty 1
  Filled 2014-12-08 (×2): qty 2

## 2014-12-08 NOTE — Progress Notes (Signed)
MD notified pt now has a suicidal sitter. Suicidal precautions initiated. Will continue to monitor.

## 2014-12-08 NOTE — Care Management Important Message (Signed)
Important Message  Patient Details  Name: Dean [name] MRN: [id number] Date of Birth: 06-24-1980   Medicare Important Message Given:  Yes    Kyla BalzarineShealy, Tomasz Steeves Abena 12/08/2014, 2:53 PM
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
## 2014-12-08 NOTE — Progress Notes (Signed)
Wasted 10 MG of Morphine in sink with Jeremy Johannara Parker RN
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
## 2014-12-09 MED ORDER — LEVETIRACETAM 500 MG PO TABS
500.0000 mg | ORAL_TABLET | Freq: Two times a day (BID) | ORAL | Status: DC
  Administered 2014-12-09 – 2014-12-11 (×4): 500 mg via ORAL
  Filled 2014-12-09 (×4): qty 1

## 2014-12-09 MED ORDER — DULOXETINE HCL 60 MG PO CPEP
60.0000 mg | ORAL_CAPSULE | Freq: Every day | ORAL | Status: DC
  Administered 2014-12-09 – 2014-12-11 (×3): 60 mg via ORAL
  Filled 2014-12-09 (×3): qty 1

## 2014-12-09 MED ORDER — SODIUM CHLORIDE 0.9 % IJ SOLN
10.0000 mL | INTRAMUSCULAR | Status: DC | PRN
  Administered 2014-12-09: 10 mL
  Administered 2014-12-10 (×2): 30 mL
  Filled 2014-12-09 (×2): qty 40
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
## 2014-12-09 NOTE — Progress Notes (Signed)
Pt safely transferred with belongings to [address], pt stable with no complaints of pain. Receiving RN at bedside, updated report given, pt WNL.

## 2014-12-09 NOTE — Progress Notes (Signed)
Trauma Service Note  Subjective: Dean [name] expressed some suicidal ideations yesterday evening.  He completely denies that now.  Objective: Vital signs in last 24 hours: Temp:  [98.1 F (36.7 C)-98.9 F (37.2 C)] 98.4 F (36.9 C) (11/29 0724) Pulse Rate:  [57-121] 73 (11/29 0800) Resp:  [17-40] 30 (11/29 0800) BP: (109-161)/(58-111) 141/81 mmHg (11/29 0800) SpO2:  [92 %-100 %] 97 % (11/29 0800) Last BM Date: 12/08/14  Intake/Output from previous day: 11/28 0701 - 11/29 0700 In: 873.7 [P.O.:240; I.V.:273.7; IV Piggyback:360] Out: 3100 [Urine:3100] Intake/Output this shift: Total I/O In: 10 [I.V.:10] Out: -   General: No distress.  Wants to take some liquids.  Lungs: Clear  Abd: Aoft, benign  Extremities: Intact  Neuro: Intact  Lab Results: CBC   Recent Labs  12/07/14 0124 12/08/14 0335  WBC 12.6* 12.8*  HGB 9.2* 9.4*  HCT 27.9* 28.5*  PLT 449* 511*   BMET No results for input(s): NA, K, CL, CO2, GLUCOSE, BUN, CREATININE, CALCIUM in the last 72 hours. PT/INR No results for input(s): LABPROT, INR in the last 72 hours. ABG No results for input(s): PHART, HCO3 in the last 72 hours.  Invalid input(s): PCO2, PO2  Studies/Results: No results found.  Anti-infectives: Anti-infectives    Start     Dose/Rate Route Frequency Ordered Stop   12/06/14 2200  ceFAZolin (ANCEF) IVPB 2 g/50 mL premix     2 g 100 mL/hr over 30 Minutes Intravenous 3 times per day 12/06/14 1816     12/05/14 2000  vancomycin (VANCOCIN) 1,250 mg in sodium chloride 0.9 % 250 mL IVPB  Status:  Discontinued     1,250 mg 166.7 mL/hr over 90 Minutes Intravenous Every 8 hours 12/05/14 1939 12/06/14 1816   12/05/14 0400  vancomycin (VANCOCIN) 1,500 mg in sodium chloride 0.9 % 500 mL IVPB  Status:  Discontinued     1,500 mg 250 mL/hr over 120 Minutes Intravenous Every 8 hours 12/04/14 1959 12/05/14 1939   12/04/14 2100  vancomycin (VANCOCIN) 500 mg in sodium chloride 0.9 % 100 mL IVPB     500  mg 100 mL/hr over 60 Minutes Intravenous  Once 12/04/14 1956 12/04/14 2206   12/03/14 2000  vancomycin (VANCOCIN) IVPB 1000 mg/200 mL premix  Status:  Discontinued     1,000 mg 200 mL/hr over 60 Minutes Intravenous Every 8 hours 12/03/14 1026 12/04/14 1959   12/03/14 1130  vancomycin (VANCOCIN) 2,500 mg in sodium chloride 0.9 % 500 mL IVPB     2,500 mg 250 mL/hr over 120 Minutes Intravenous  Once 12/03/14 1026 12/03/14 1319   12/02/14 1800  piperacillin-tazobactam (ZOSYN) IVPB 3.375 g  Status:  Discontinued     3.375 g 12.5 mL/hr over 240 Minutes Intravenous 3 times per day 12/02/14 1731 12/06/14 1816      Assessment/Plan: s/p  Transfer to the floor.  He dienies suicidal ideation currently  Not sure if psychiatric evaluation will be useful if patient in denial. Make sure patienton his usual medications.  LOS: 12 days   Marta LamasJames O. Gae BonWyatt, III, MD, FACS [phone] Trauma Surgeon 12/09/2014

## 2014-12-10 DIAGNOSIS — R45851 Suicidal ideations: Secondary | ICD-10-CM

## 2014-12-10 DIAGNOSIS — F1120 Opioid dependence, uncomplicated: Secondary | ICD-10-CM

## 2014-12-10 DIAGNOSIS — F1920 Other psychoactive substance dependence, uncomplicated: Secondary | ICD-10-CM

## 2014-12-10 DIAGNOSIS — D62 Acute posthemorrhagic anemia: Secondary | ICD-10-CM | POA: Diagnosis not present

## 2014-12-10 DIAGNOSIS — E2749 Other adrenocortical insufficiency: Secondary | ICD-10-CM | POA: Diagnosis present

## 2014-12-10 MED ORDER — MORPHINE SULFATE (PF) 2 MG/ML IV SOLN: 2.0000 mg | INTRAVENOUS | Status: DC | PRN

## 2014-12-10 MED ORDER — OXYCODONE HCL 5 MG PO TABS
10.0000 mg | ORAL_TABLET | ORAL | Status: DC | PRN
  Administered 2014-12-10: 15 mg via ORAL
  Administered 2014-12-10 (×2): 20 mg via ORAL
  Administered 2014-12-10: 15 mg via ORAL
  Administered 2014-12-11: 10 mg via ORAL
  Administered 2014-12-11: 20 mg via ORAL
  Filled 2014-12-10: qty 3
  Filled 2014-12-10: qty 4
  Filled 2014-12-10: qty 3
  Filled 2014-12-10: qty 4
  Filled 2014-12-10: qty 2
  Filled 2014-12-10: qty 3
  Filled 2014-12-10: qty 2

## 2014-12-10 MED ORDER — POTASSIUM CHLORIDE CRYS ER 20 MEQ PO TBCR
20.0000 meq | EXTENDED_RELEASE_TABLET | Freq: Two times a day (BID) | ORAL | Status: DC
  Administered 2014-12-10 – 2014-12-11 (×3): 20 meq via ORAL
  Filled 2014-12-10 (×3): qty 1

## 2014-12-10 MED ORDER — VITAMIN B-1 100 MG PO TABS
100.0000 mg | ORAL_TABLET | Freq: Every day | ORAL | Status: DC
  Administered 2014-12-11: 100 mg via ORAL
  Filled 2014-12-10: qty 1

## 2014-12-10 MED ORDER — DOCUSATE SODIUM 100 MG PO CAPS
100.0000 mg | ORAL_CAPSULE | Freq: Two times a day (BID) | ORAL | Status: DC
  Administered 2014-12-10 – 2014-12-11 (×3): 100 mg via ORAL
  Filled 2014-12-10 (×3): qty 1

## 2014-12-10 MED ORDER — POLYETHYLENE GLYCOL 3350 17 G PO PACK
17.0000 g | PACK | Freq: Every day | ORAL | Status: DC
  Administered 2014-12-10 – 2014-12-11 (×2): 17 g via ORAL
  Filled 2014-12-10 (×2): qty 1

## 2014-12-10 MED ORDER — FUROSEMIDE 40 MG PO TABS
40.0000 mg | ORAL_TABLET | Freq: Every day | ORAL | Status: DC
  Administered 2014-12-10 – 2014-12-11 (×2): 40 mg via ORAL
  Filled 2014-12-10 (×2): qty 1
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
## 2014-12-10 NOTE — Progress Notes (Signed)
Met with pt to discuss discharge plans.  Pt states he plans to stay with his mother at dc, "on her couch" until he gets back on his feet.  Pt states he has no needs for home, and is able to get medications filled without difficulty.  Attempted to call pt's mother, Leland Bing, at number listed in chart, but was unable to leave message as voicemail has not been set up.  Note in chart from yesterday evening by RN states mother uncomfortable with pt staying with her, but unable to confirm this.  Should mother visit this evening, may need to revisit this, as pt likely nearing discharge.    Will continue to follow.    Reinaldo Raddle, RN, BSN  Trauma/Neuro ICU Case Manager [phone]
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
## 2014-12-11 DIAGNOSIS — J9600 Acute respiratory failure, unspecified whether with hypoxia or hypercapnia: Secondary | ICD-10-CM | POA: Diagnosis not present

## 2014-12-11 MED ORDER — OXYCODONE-ACETAMINOPHEN 10-325 MG PO TABS: 1.0000 | ORAL_TABLET | ORAL | Status: DC | PRN
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
## 2014-12-11 NOTE — Progress Notes (Signed)
Patient ID: Michelene HeadyJustin T Mellor, male   DOB: December 31, 1980, 34 y.o.   MRN: [id number]   LOS: 14 days   Subjective: No new c/o.    Objective: Vital signs in last 24 hours: Temp:  [98.2 F (36.8 C)-99 F (37.2 C)] 98.2 F (36.8 C) (11/30 2219) Pulse Rate:  [65-89] 75 (11/30 2219) Resp:  [16-19] 18 (11/30 2219) BP: (134-145)/(82-89) 145/89 mmHg (11/30 2219) SpO2:  [95 %-98 %] 98 % (11/30 2219) Last BM Date: 12/10/14   Physical Exam General appearance: alert and no distress Resp: clear to auscultation bilaterally Cardio: regular rate and rhythm GI: normal findings: bowel sounds normal and soft, non-tender   Assessment/Plan: [facility] Multiple left rib fxs -- Pulmonary toilet Grade 2 splenic lac -- Hgb stable Left adrenal hemorrhage ABL anemia -- Stable PSA -- CIWA SI -- Cleared by psych Dispo -- Home today    Freeman CaldronMichael J. Jackelyn Illingworth, PA-C Pager: [phone] General Trauma PA Pager: [phone]  12/11/2014

## 2014-12-11 NOTE — Care Management Important Message (Signed)
Important Message  Patient Details  Name: Dean HeadyJustin T [name] MRN: [id number] Date of Birth: Nov 28, 1980   Medicare Important Message Given:  Yes    [name] 12/11/2014, 1:38 PM

## 2014-12-11 NOTE — Discharge Planning (Signed)
Patient discharged home in stable condition. Verbalizes understanding of all discharge instructions, including home medications and follow up appointments. 

## 2014-12-11 NOTE — Discharge Instructions (Signed)
No running, jumping, ball or contact sports, bikes, skateboards, motorcycles, etc for 6 weeks. ° °No driving while taking oxycodone. °

## 2014-12-22 ENCOUNTER — Telehealth (HOSPITAL_COMMUNITY): Payer: Self-pay | Admitting: Orthopedic Surgery

## 2014-12-22 NOTE — Telephone Encounter (Signed)
Referred pt to [facility] for [facility]

## 2014-12-23 ENCOUNTER — Ambulatory Visit: Payer: Medicare Other | Attending: Orthopedic Surgery | Admitting: Physical Therapy

## 2014-12-23 DIAGNOSIS — R269 Unspecified abnormalities of gait and mobility: Secondary | ICD-10-CM | POA: Diagnosis not present

## 2014-12-23 DIAGNOSIS — R2681 Unsteadiness on feet: Secondary | ICD-10-CM | POA: Insufficient documentation

## 2014-12-24 ENCOUNTER — Encounter: Payer: Self-pay | Admitting: Physical Therapy
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
## 2015-01-07 DIAGNOSIS — S2241XB Multiple fractures of ribs, right side, initial encounter for open fracture: Secondary | ICD-10-CM | POA: Diagnosis not present

## 2015-01-07 DIAGNOSIS — F319 Bipolar disorder, unspecified: Secondary | ICD-10-CM | POA: Diagnosis not present

## 2015-01-07 DIAGNOSIS — F4312 Post-traumatic stress disorder, chronic: Secondary | ICD-10-CM | POA: Diagnosis not present

## 2015-05-29 ENCOUNTER — Other Ambulatory Visit: Payer: Self-pay | Admitting: Pulmonary Disease

## 2015-05-29 DIAGNOSIS — M542 Cervicalgia: Secondary | ICD-10-CM

## 2015-06-04 ENCOUNTER — Emergency Department (HOSPITAL_COMMUNITY): Payer: Medicare Other

## 2015-06-04 ENCOUNTER — Emergency Department (HOSPITAL_COMMUNITY)
Admission: EM | Admit: 2015-06-04 | Discharge: 2015-06-05 | Disposition: A | Discharge status: Home / Self Care | Payer: Medicare Other | Attending: Emergency Medicine | Admitting: Emergency Medicine

## 2015-06-04 ENCOUNTER — Encounter (HOSPITAL_COMMUNITY): Payer: Self-pay | Admitting: *Deleted

## 2015-06-04 DIAGNOSIS — S301XXA Contusion of abdominal wall, initial encounter: Secondary | ICD-10-CM

## 2015-06-04 DIAGNOSIS — S0990XA Unspecified injury of head, initial encounter: Secondary | ICD-10-CM | POA: Diagnosis not present

## 2015-06-04 DIAGNOSIS — Y9389 Activity, other specified: Secondary | ICD-10-CM | POA: Diagnosis not present

## 2015-06-04 DIAGNOSIS — M25531 Pain in right wrist: Secondary | ICD-10-CM | POA: Diagnosis not present

## 2015-06-04 DIAGNOSIS — F17210 Nicotine dependence, cigarettes, uncomplicated: Secondary | ICD-10-CM | POA: Diagnosis not present

## 2015-06-04 DIAGNOSIS — D649 Anemia, unspecified: Secondary | ICD-10-CM | POA: Insufficient documentation

## 2015-06-04 DIAGNOSIS — S199XXA Unspecified injury of neck, initial encounter: Secondary | ICD-10-CM | POA: Diagnosis not present

## 2015-06-04 DIAGNOSIS — M1990 Unspecified osteoarthritis, unspecified site: Secondary | ICD-10-CM | POA: Diagnosis not present

## 2015-06-04 DIAGNOSIS — F10120 Alcohol abuse with intoxication, uncomplicated: Secondary | ICD-10-CM | POA: Insufficient documentation

## 2015-06-04 DIAGNOSIS — Y92410 Unspecified street and highway as the place of occurrence of the external cause: Secondary | ICD-10-CM | POA: Insufficient documentation

## 2015-06-04 DIAGNOSIS — Y999 Unspecified external cause status: Secondary | ICD-10-CM | POA: Diagnosis not present

## 2015-06-04 DIAGNOSIS — S6991XA Unspecified injury of right wrist, hand and finger(s), initial encounter: Secondary | ICD-10-CM | POA: Diagnosis not present

## 2015-06-04 DIAGNOSIS — F319 Bipolar disorder, unspecified: Secondary | ICD-10-CM | POA: Diagnosis not present

## 2015-06-04 DIAGNOSIS — S299XXA Unspecified injury of thorax, initial encounter: Secondary | ICD-10-CM | POA: Diagnosis not present

## 2015-06-04 DIAGNOSIS — S3993XA Unspecified injury of pelvis, initial encounter: Secondary | ICD-10-CM | POA: Diagnosis not present

## 2015-06-04 DIAGNOSIS — R0602 Shortness of breath: Secondary | ICD-10-CM | POA: Diagnosis not present

## 2015-06-04 DIAGNOSIS — R109 Unspecified abdominal pain: Secondary | ICD-10-CM | POA: Diagnosis present

## 2015-06-04 DIAGNOSIS — R7989 Other specified abnormal findings of blood chemistry: Secondary | ICD-10-CM

## 2015-06-04 DIAGNOSIS — R740 Nonspecific elevation of levels of transaminase and lactic acid dehydrogenase [LDH]: Secondary | ICD-10-CM | POA: Diagnosis not present

## 2015-06-04 DIAGNOSIS — F10920 Alcohol use, unspecified with intoxication, uncomplicated: Secondary | ICD-10-CM

## 2015-06-04 DIAGNOSIS — S3991XA Unspecified injury of abdomen, initial encounter: Secondary | ICD-10-CM | POA: Diagnosis not present

## 2015-06-04 LAB — I-STAT CHEM 8, ED
BUN: <3 mg/dL — ABNORMAL LOW (ref 6–20)
Calcium, Ion: 1.12 mmol/L (ref 1.12–1.23)
Chloride: 100 mmol/L — ABNORMAL LOW (ref 101–111)
Creatinine, Ser: 0.8 mg/dL (ref 0.61–1.24)
Glucose, Bld: 115 mg/dL — ABNORMAL HIGH (ref 65–99)
HCT: 36 % — ABNORMAL LOW (ref 39.0–52.0)
Hemoglobin: 12.2 g/dL — ABNORMAL LOW (ref 13.0–17.0)
Potassium: 3.6 mmol/L (ref 3.5–5.1)
Sodium: 139 mmol/L (ref 135–145)
TCO2: 24 mmol/L (ref 0–100)

## 2015-06-04 LAB — CBC
HCT: 35.5 % — ABNORMAL LOW (ref 39.0–52.0)
Hemoglobin: 11.8 g/dL — ABNORMAL LOW (ref 13.0–17.0)
MCH: 30.5 pg (ref 26.0–34.0)
MCHC: 33.2 g/dL (ref 30.0–36.0)
MCV: 91.7 fL (ref 78.0–100.0)
Platelets: 208 10*3/uL (ref 150–400)
RBC: 3.87 MIL/uL — ABNORMAL LOW (ref 4.22–5.81)
RDW: 13.5 % (ref 11.5–15.5)
WBC: 7.4 10*3/uL (ref 4.0–10.5)

## 2015-06-04 LAB — PROTIME-INR
INR: 0.94 (ref 0.00–1.49)
Prothrombin Time: 12.8 seconds (ref 11.6–15.2)

## 2015-06-04 LAB — COMPREHENSIVE METABOLIC PANEL
ALT: 43 U/L (ref 17–63)
AST: 40 U/L (ref 15–41)
Albumin: 3.2 g/dL — ABNORMAL LOW (ref 3.5–5.0)
Alkaline Phosphatase: 103 U/L (ref 38–126)
Anion gap: 12 (ref 5–15)
BUN: <5 mg/dL — ABNORMAL LOW (ref 6–20)
CO2: 24 mmol/L (ref 22–32)
Calcium: 8.2 mg/dL — ABNORMAL LOW (ref 8.9–10.3)
Chloride: 100 mmol/L — ABNORMAL LOW (ref 101–111)
Creatinine, Ser: 0.69 mg/dL (ref 0.61–1.24)
GFR calc Af Amer: >60 mL/min (ref >60)
GFR calc non Af Amer: >60 mL/min (ref >60)
Glucose, Bld: 118 mg/dL — ABNORMAL HIGH (ref 65–99)
Potassium: 3.4 mmol/L — ABNORMAL LOW (ref 3.5–5.1)
Sodium: 136 mmol/L (ref 135–145)
Total Bilirubin: 0.3 mg/dL (ref 0.3–1.2)
Total Protein: 6.4 g/dL — ABNORMAL LOW (ref 6.5–8.1)

## 2015-06-04 LAB — I-STAT CG4 LACTIC ACID, ED: Lactic Acid, Venous: 2.29 mmol/L (ref 0.5–2.0)

## 2015-06-04 MED ORDER — FENTANYL CITRATE (PF) 100 MCG/2ML IJ SOLN
50.0000 ug | Freq: Once | INTRAMUSCULAR | Status: AC
  Administered 2015-06-04: 50 ug via INTRAVENOUS
  Filled 2015-06-04: qty 2

## 2015-06-04 MED ORDER — IOPAMIDOL (ISOVUE-300) INJECTION 61%
100.0000 mL | Freq: Once | INTRAVENOUS | Status: AC | PRN
  Administered 2015-06-04: 100 mL via INTRAVENOUS
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
## 2015-06-04 NOTE — ED Notes (Addendum)
CRITICAL VALUE ALERT  Critical value received:  Lactic 2.29  Date of notification:  06/04/15  Time of notification:  2333  Critical value read back:Yes.    Nurse who received alert:  Thornton Daleson L Braelyn Jenson, RN  MD notified (1st page):  Rancour  Time of first page:  2333  MD notified (2nd page):  Time of second page:  Responding MD:  Rancour  Time MD responded:  2333
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
## 2015-06-05 DIAGNOSIS — S301XXA Contusion of abdominal wall, initial encounter: Secondary | ICD-10-CM | POA: Diagnosis not present

## 2015-06-05 LAB — SAMPLE TO BLOOD BANK

## 2015-06-05 LAB — ETHANOL: Alcohol, Ethyl (B): 96 mg/dL — ABNORMAL HIGH (ref <5)

## 2015-06-05 LAB — I-STAT CG4 LACTIC ACID, ED: Lactic Acid, Venous: 1.54 mmol/L (ref 0.5–2.0)

## 2015-06-05 MED ORDER — OXYCODONE-ACETAMINOPHEN 5-325 MG PO TABS: 1.0000 | ORAL_TABLET | ORAL | Status: DC | PRN

## 2015-06-05 MED ORDER — SODIUM CHLORIDE 0.9 % IV BOLUS (SEPSIS)
1000.0000 mL | Freq: Once | INTRAVENOUS | Status: AC
  Administered 2015-06-05: 1000 mL via INTRAVENOUS

## 2015-06-05 NOTE — ED Notes (Signed)
C collar removed after [name] reviewed CT results.
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
## 2015-06-05 NOTE — ED Notes (Signed)
Pt alert & oriented x4, stable gait. Patient given discharge instructions, paperwork & prescription(s). Patient informed not to drive, operate any equipment & handel any important documents 4 hours after taking pain medication. Patient  instructed to stop at the registration desk to finish any additional paperwork. Patient  verbalized understanding. Pt left department w/ no further questions. 
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
## 2015-06-11 ENCOUNTER — Inpatient Hospital Stay
Admission: RE | Admit: 2015-06-11 | Discharge: 2015-06-11 | Disposition: A | Discharge status: Home / Self Care | Payer: Self-pay | Source: Ambulatory Visit | Attending: Pulmonary Disease | Admitting: Pulmonary Disease

## 2015-06-11 NOTE — Discharge Instructions (Signed)

## 2015-06-19 MED FILL — Oxycodone w/ Acetaminophen Tab 5-325 MG: ORAL | Qty: 6 | Status: AC

## 2016-02-07 ENCOUNTER — Encounter (HOSPITAL_COMMUNITY): Payer: Self-pay | Admitting: *Deleted

## 2016-02-07 ENCOUNTER — Emergency Department (HOSPITAL_COMMUNITY)
Admission: EM | Admit: 2016-02-07 | Discharge: 2016-02-07 | Disposition: A | Discharge status: Home / Self Care | Payer: Medicare Other | Attending: Emergency Medicine | Admitting: Emergency Medicine

## 2016-02-07 DIAGNOSIS — M79604 Pain in right leg: Secondary | ICD-10-CM | POA: Diagnosis present

## 2016-02-07 DIAGNOSIS — L03115 Cellulitis of right lower limb: Secondary | ICD-10-CM

## 2016-02-07 DIAGNOSIS — Z79899 Other long term (current) drug therapy: Secondary | ICD-10-CM | POA: Diagnosis not present

## 2016-02-07 DIAGNOSIS — F17210 Nicotine dependence, cigarettes, uncomplicated: Secondary | ICD-10-CM | POA: Insufficient documentation

## 2016-02-07 LAB — CBG MONITORING, ED: Glucose-Capillary: 150 mg/dL — ABNORMAL HIGH (ref 65–99)

## 2016-02-07 MED ORDER — CEPHALEXIN 500 MG PO CAPS: 500.0000 mg | ORAL_CAPSULE | Freq: Four times a day (QID) | ORAL | 0 refills | Status: DC

## 2016-02-07 MED ORDER — LIDOCAINE HCL (PF) 1 % IJ SOLN
INTRAMUSCULAR | Status: AC
  Administered 2016-02-07: 5 mL
  Filled 2016-02-07: qty 5

## 2016-02-07 MED ORDER — CEFTRIAXONE SODIUM 1 G IJ SOLR
1.0000 g | Freq: Once | INTRAMUSCULAR | Status: AC
  Administered 2016-02-07: 1 g via INTRAMUSCULAR
  Filled 2016-02-07: qty 10
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
## 2016-04-23 ENCOUNTER — Encounter (HOSPITAL_COMMUNITY): Payer: Self-pay | Admitting: Emergency Medicine

## 2016-04-23 ENCOUNTER — Emergency Department (HOSPITAL_COMMUNITY)
Admission: EM | Admit: 2016-04-23 | Discharge: 2016-04-23 | Disposition: A | Discharge status: Home / Self Care | Payer: Medicare Other | Attending: Emergency Medicine | Admitting: Emergency Medicine

## 2016-04-23 DIAGNOSIS — F17210 Nicotine dependence, cigarettes, uncomplicated: Secondary | ICD-10-CM | POA: Diagnosis not present

## 2016-04-23 DIAGNOSIS — Z79899 Other long term (current) drug therapy: Secondary | ICD-10-CM | POA: Diagnosis not present

## 2016-04-23 DIAGNOSIS — R51 Headache: Secondary | ICD-10-CM | POA: Diagnosis present

## 2016-04-23 DIAGNOSIS — J0100 Acute maxillary sinusitis, unspecified: Secondary | ICD-10-CM | POA: Diagnosis not present

## 2016-04-23 MED ORDER — AMOXICILLIN-POT CLAVULANATE 875-125 MG PO TABS: 1.0000 | ORAL_TABLET | Freq: Two times a day (BID) | ORAL | 0 refills | Status: DC

## 2016-04-23 MED ORDER — AMOXICILLIN-POT CLAVULANATE 875-125 MG PO TABS
1.0000 | ORAL_TABLET | Freq: Once | ORAL | Status: AC
  Administered 2016-04-23: 1 via ORAL
  Filled 2016-04-23: qty 1
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
## 2016-04-23 NOTE — Discharge Instructions (Signed)
Use heat on the sore area 3 or 4 times a day.  Take Motrin, or Tylenol for pain.  Use Afrin nasal spray 1 in each nostril, twice a day for 3 or 4 days.  Return here, or see your doctor, for problems.

## 2016-04-23 NOTE — ED Triage Notes (Signed)
Pt reports intermittent facial, dental pain x 3 days. Pt states pain is worse today. Pt taken BC and tylenol with limited relief. Mild swelling to right maxillary area.

## 2017-01-26 ENCOUNTER — Ambulatory Visit (HOSPITAL_COMMUNITY): Payer: Self-pay | Admitting: Psychiatry

## 2017-04-11 ENCOUNTER — Ambulatory Visit (HOSPITAL_COMMUNITY): Payer: Medicare Other | Admitting: Psychiatry

## 2017-05-14 IMAGING — CR DG CHEST 1V PORT
1 series · 1 of 1 positions shown · non-contrast
Comparison: 12/05/2014

CLINICAL DATA: Self extubation

EXAM:
PORTABLE CHEST 1 VIEW

[AP]
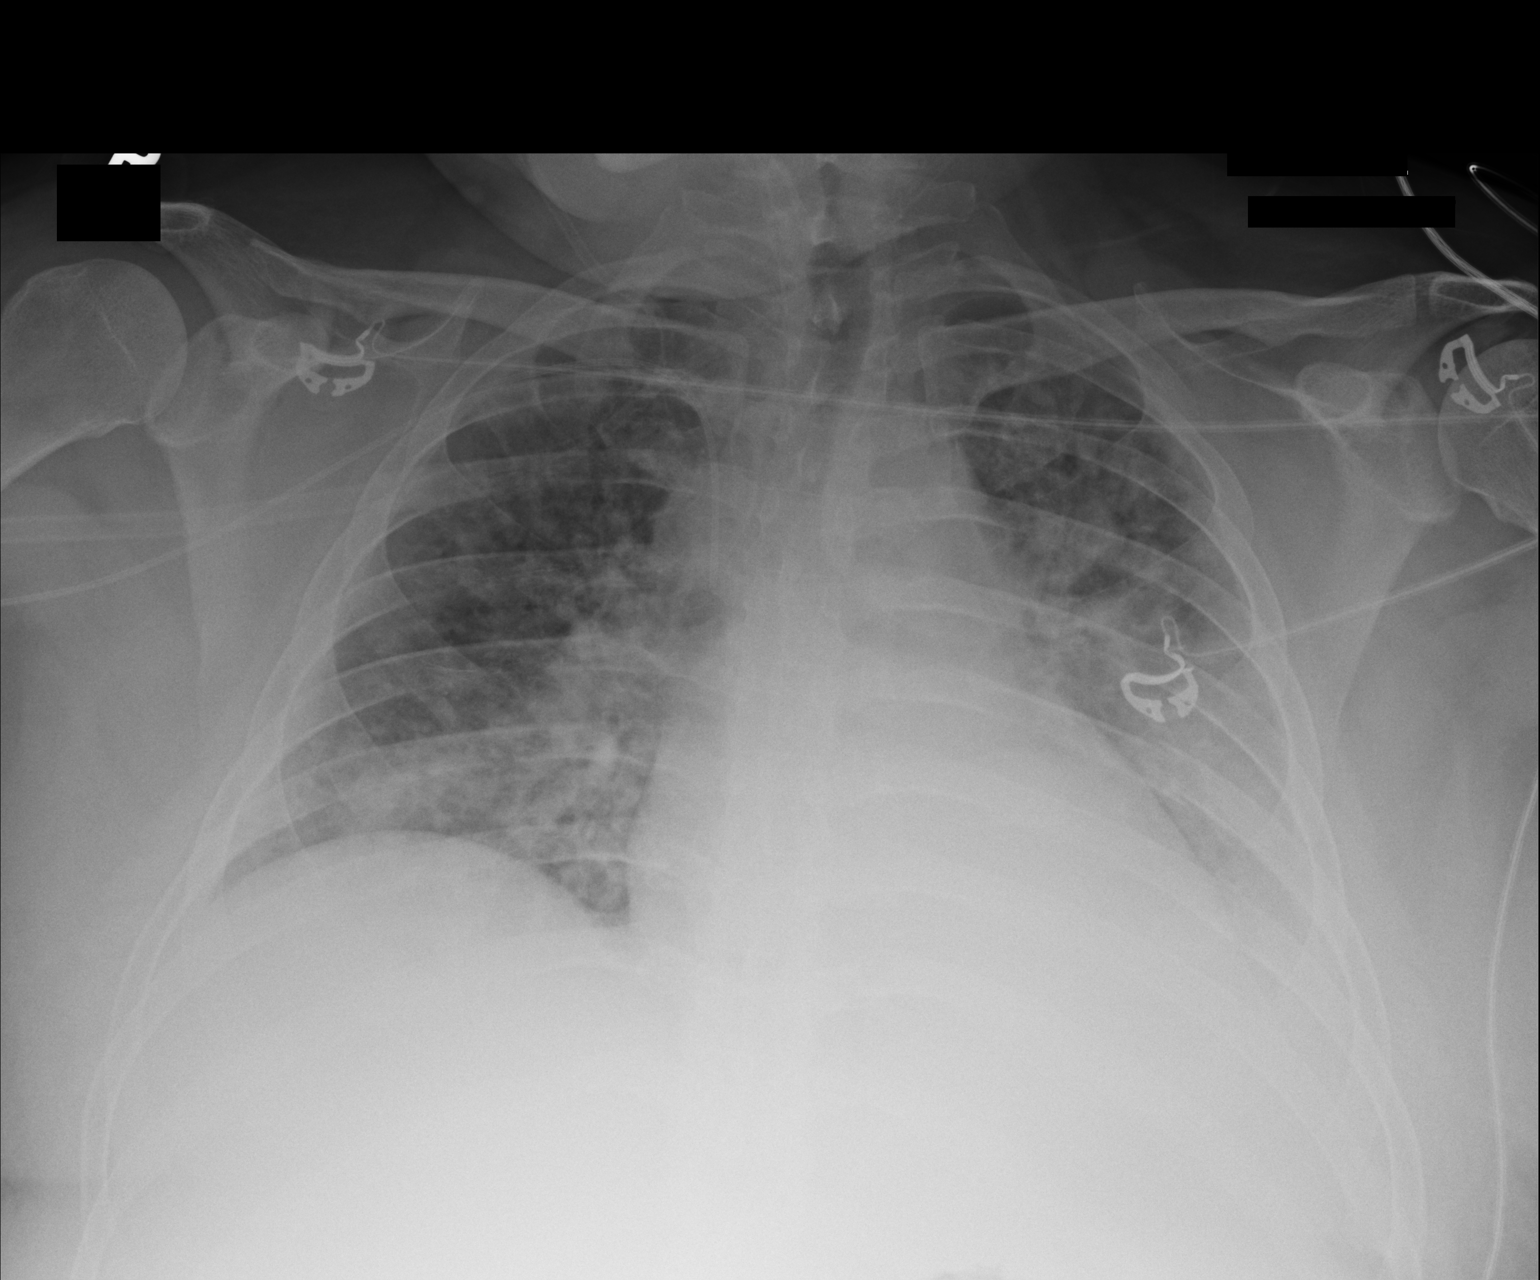

[1 of 1 positions shown; findings below may reference images not displayed]

FINDINGS: Removal of endotracheal tube and NG tube. RIGHT central venous line
remains unchanged. There is LEFT basilar atelectasis unchanged.
Patchy bilateral airspace disease is not significantly changed.
IMPRESSION: 1. Interval extubation.
2. Persistent bilateral airspace disease representing edema versus
infection.
3. Dense LEFT basilar atelectasis.

## 2017-05-14 IMAGING — CR DG CHEST 1V PORT
1 series · 1 of 1 positions shown · non-contrast
Comparison: Chest radiograph performed earlier today at [DATE] p.m.

CLINICAL DATA: Right PICC repositioning.  Initial encounter.

EXAM:
PORTABLE CHEST 1 VIEW

[AP]
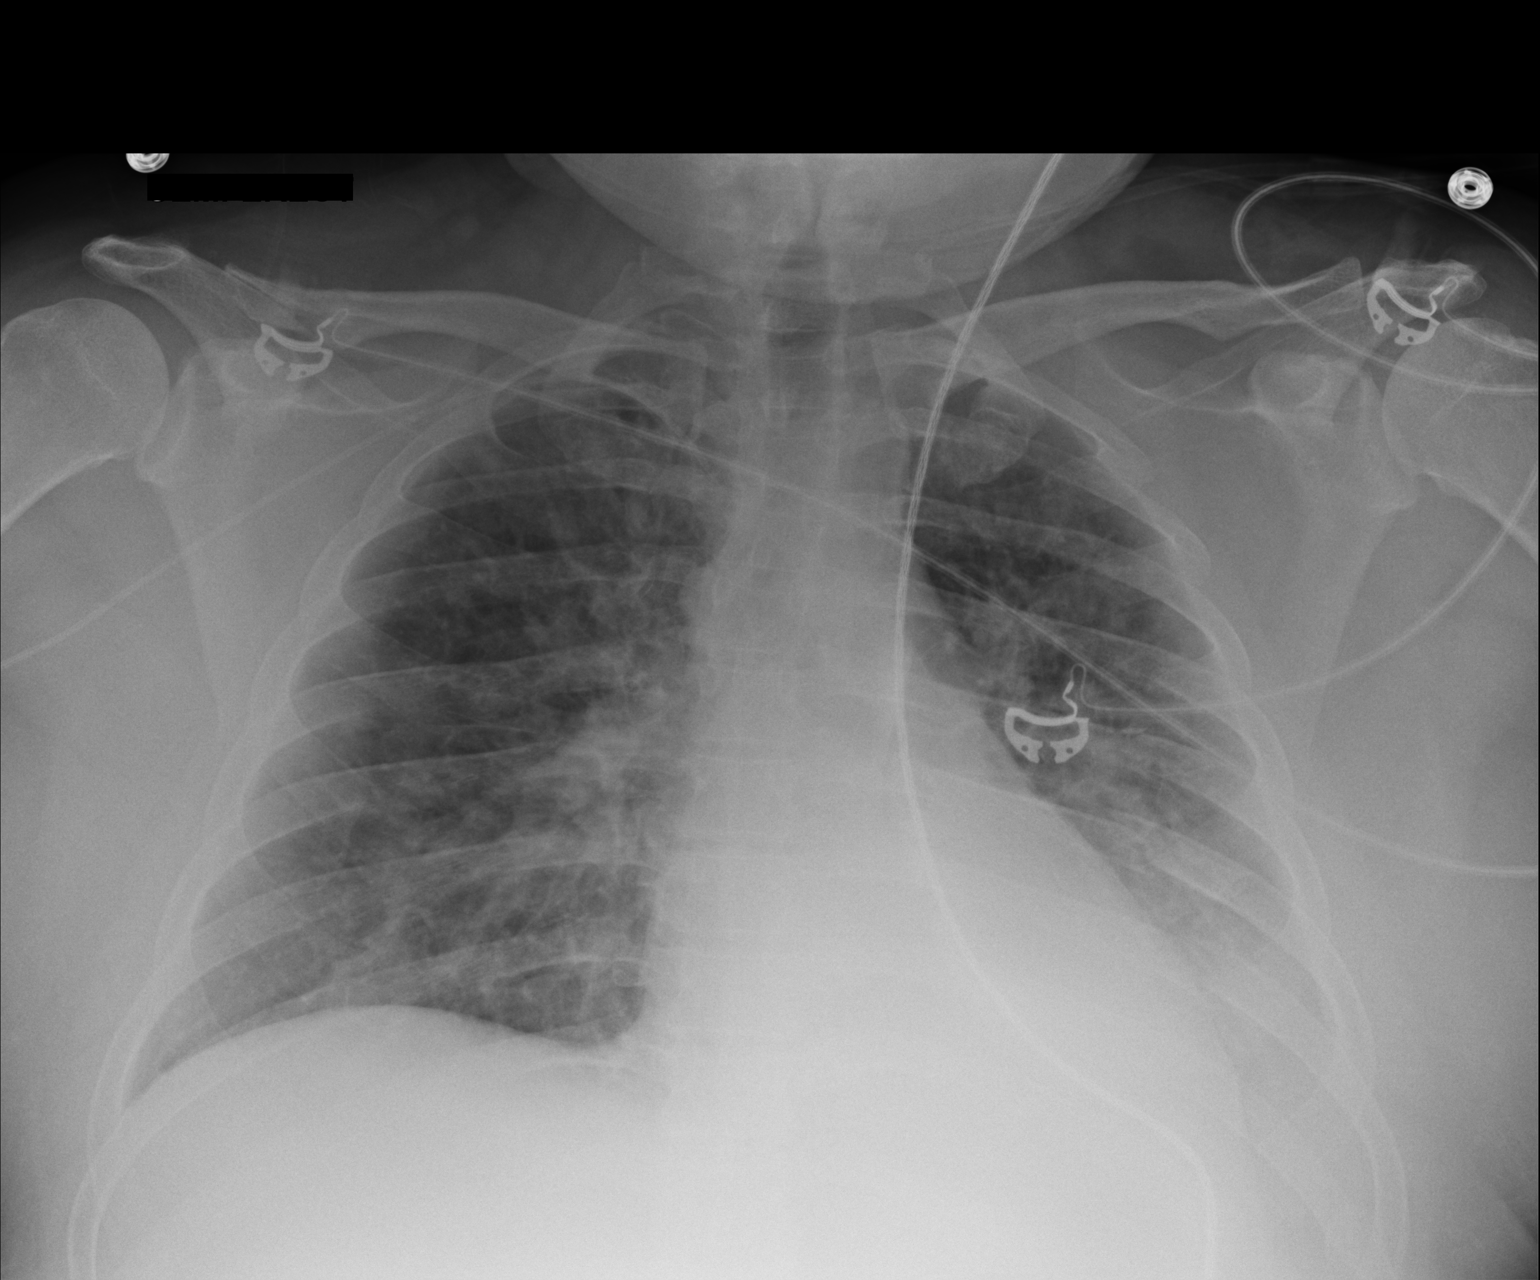

[1 of 1 positions shown; findings below may reference images not displayed]

FINDINGS: The patient's right PICC is noted ending about the proximal SVC.

A small left pleural effusion is noted. Vascular congestion is
noted, with bilateral central airspace opacities, likely reflecting
pulmonary edema. No pneumothorax is seen.

The cardiomediastinal silhouette is normal in size. No acute osseous
abnormalities are identified.
IMPRESSION: 1. Right PICC noted ending about the proximal SVC.
2. Small left pleural effusion noted. Vascular congestion, with
bilateral central airspace opacities, likely reflecting pulmonary
edema.

## 2017-05-15 IMAGING — CR DG CHEST 1V PORT
1 series · 1 of 1 positions shown · non-contrast
Comparison: Radiograph 12/06/2014

CLINICAL DATA: Respiratory failure

EXAM:
PORTABLE CHEST 1 VIEW

[AP]
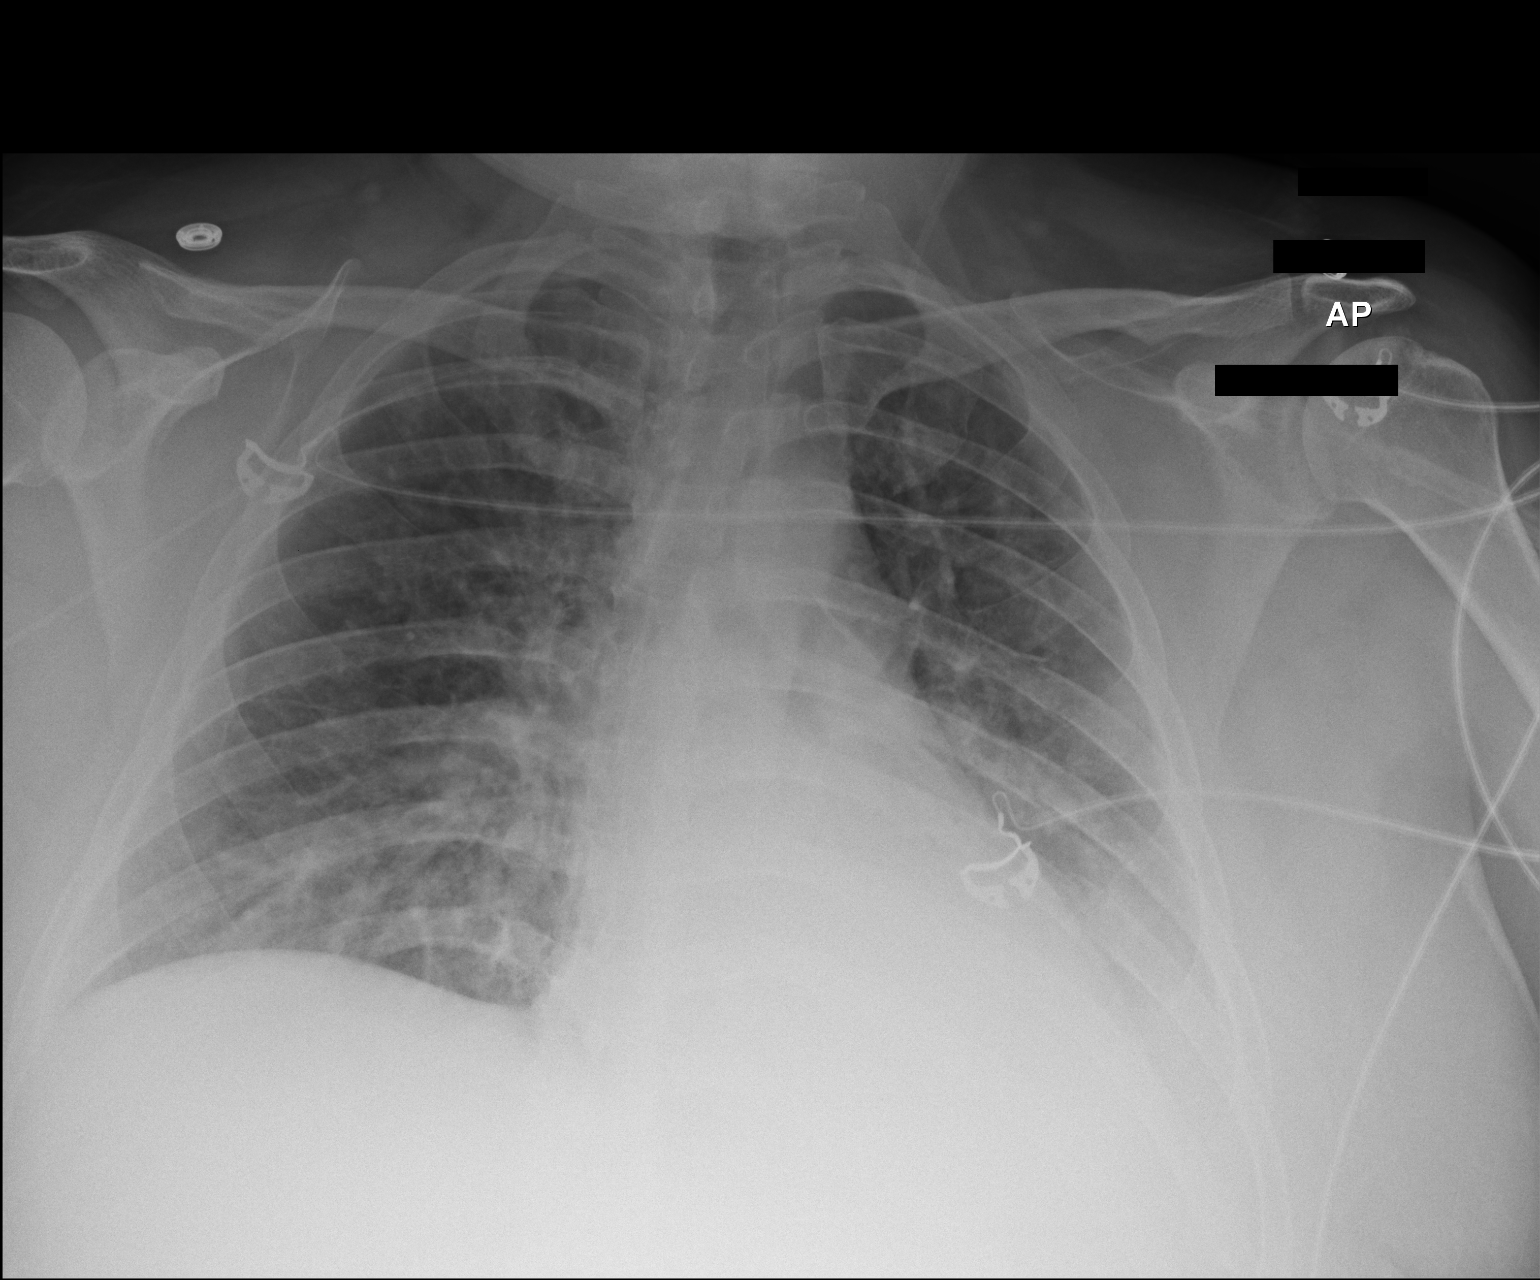

[1 of 1 positions shown; findings below may reference images not displayed]

FINDINGS: Normal cardiac silhouette. There is dense LEFT lower lobe
atelectasis and fusion. Mild streaky opacities at the RIGHT lung
base are also slightly increased. No overt pulmonary edema. No
pneumothorax.
IMPRESSION: 1. Bibasilar atelectasis and LEFT effusion. RIGHT lower lobe
atelectasis slightly increased.
2. Cannot exclude LEFT lower lobe infiltrate.

## 2018-03-13 ENCOUNTER — Emergency Department (HOSPITAL_COMMUNITY)
Admission: EM | Admit: 2018-03-13 | Discharge: 2018-03-14 | Disposition: A | Discharge status: Home / Self Care | Payer: Medicare Other | Attending: Emergency Medicine | Admitting: Emergency Medicine

## 2018-03-13 ENCOUNTER — Other Ambulatory Visit: Payer: Self-pay

## 2018-03-13 ENCOUNTER — Emergency Department (HOSPITAL_COMMUNITY): Payer: Medicare Other

## 2018-03-13 ENCOUNTER — Encounter (HOSPITAL_COMMUNITY): Payer: Self-pay | Admitting: Emergency Medicine

## 2018-03-13 DIAGNOSIS — Z79891 Long term (current) use of opiate analgesic: Secondary | ICD-10-CM | POA: Insufficient documentation

## 2018-03-13 DIAGNOSIS — S43015A Anterior dislocation of left humerus, initial encounter: Secondary | ICD-10-CM | POA: Diagnosis not present

## 2018-03-13 DIAGNOSIS — Y929 Unspecified place or not applicable: Secondary | ICD-10-CM | POA: Diagnosis not present

## 2018-03-13 DIAGNOSIS — Y9389 Activity, other specified: Secondary | ICD-10-CM | POA: Diagnosis not present

## 2018-03-13 DIAGNOSIS — Z79899 Other long term (current) drug therapy: Secondary | ICD-10-CM | POA: Insufficient documentation

## 2018-03-13 DIAGNOSIS — F17210 Nicotine dependence, cigarettes, uncomplicated: Secondary | ICD-10-CM | POA: Diagnosis not present

## 2018-03-13 DIAGNOSIS — S4992XA Unspecified injury of left shoulder and upper arm, initial encounter: Secondary | ICD-10-CM | POA: Diagnosis present

## 2018-03-13 DIAGNOSIS — W010XXA Fall on same level from slipping, tripping and stumbling without subsequent striking against object, initial encounter: Secondary | ICD-10-CM | POA: Diagnosis not present

## 2018-03-13 DIAGNOSIS — Y999 Unspecified external cause status: Secondary | ICD-10-CM | POA: Diagnosis not present

## 2018-03-13 MED ORDER — PROPOFOL 10 MG/ML IV BOLUS
0.5000 mg/kg | Freq: Once | INTRAVENOUS | Status: AC
  Administered 2018-03-13: 37.4 mg via INTRAVENOUS
  Filled 2018-03-13: qty 20
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
## 2018-03-13 NOTE — ED Triage Notes (Signed)
Pt c/o left shoulder pain after fall 2 hours ago.

## 2018-03-14 ENCOUNTER — Emergency Department (HOSPITAL_COMMUNITY): Payer: Medicare Other

## 2018-03-14 NOTE — Discharge Instructions (Signed)
Wear the sling as needed.  Apply ice for 30 minutes, four times a day.  Take ibuprofen or naproxen as needed for pain.

## 2019-05-02 ENCOUNTER — Other Ambulatory Visit: Payer: Self-pay

## 2019-05-02 ENCOUNTER — Encounter (HOSPITAL_COMMUNITY): Payer: Self-pay | Admitting: Emergency Medicine

## 2019-05-02 ENCOUNTER — Emergency Department (HOSPITAL_COMMUNITY)
Admission: EM | Admit: 2019-05-02 | Discharge: 2019-05-02 | Disposition: A | Discharge status: Home / Self Care | Payer: Medicare Other | Attending: Emergency Medicine | Admitting: Emergency Medicine

## 2019-05-02 DIAGNOSIS — R600 Localized edema: Secondary | ICD-10-CM | POA: Insufficient documentation

## 2019-05-02 DIAGNOSIS — K0889 Other specified disorders of teeth and supporting structures: Secondary | ICD-10-CM

## 2019-05-02 DIAGNOSIS — F17210 Nicotine dependence, cigarettes, uncomplicated: Secondary | ICD-10-CM | POA: Insufficient documentation

## 2019-05-02 MED ORDER — HYDROCODONE-ACETAMINOPHEN 5-325 MG PO TABS
1.0000 | ORAL_TABLET | Freq: Once | ORAL | Status: AC
  Administered 2019-05-02: 1 via ORAL
  Filled 2019-05-02: qty 1

## 2019-05-02 MED ORDER — AMOXICILLIN 500 MG PO CAPS: 500.0000 mg | ORAL_CAPSULE | Freq: Three times a day (TID) | ORAL | 0 refills | Status: AC

## 2019-05-02 MED ORDER — AMOXICILLIN 250 MG PO CAPS
500.0000 mg | ORAL_CAPSULE | Freq: Three times a day (TID) | ORAL | Status: DC
  Administered 2019-05-02: 500 mg via ORAL
  Filled 2019-05-02: qty 2

## 2019-05-02 MED ORDER — NAPROXEN 500 MG PO TABS: 500.0000 mg | ORAL_TABLET | Freq: Two times a day (BID) | ORAL | 0 refills | Status: AC
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
## 2019-05-02 NOTE — Discharge Instructions (Signed)
Take the medication as prescribed.  Follow-up outpatient

## 2019-05-02 NOTE — ED Triage Notes (Signed)
Pain to right upper tooth, rating pain 8/10.  Difficulty chewing.
